# Patient Record
Sex: Female | Born: 1975 | Race: Black or African American | Hispanic: No | Marital: Single | Smoking: Former smoker
Health system: Southern US, Community
[De-identification: ages and names within clinical notes are randomized; demographics above are authoritative.]

## PROBLEM LIST (undated history)

## (undated) DIAGNOSIS — I10 Essential (primary) hypertension: Secondary | ICD-10-CM

## (undated) DIAGNOSIS — F419 Anxiety disorder, unspecified: Secondary | ICD-10-CM

## (undated) DIAGNOSIS — K219 Gastro-esophageal reflux disease without esophagitis: Secondary | ICD-10-CM

## (undated) DIAGNOSIS — M109 Gout, unspecified: Secondary | ICD-10-CM

## (undated) DIAGNOSIS — E669 Obesity, unspecified: Secondary | ICD-10-CM

## (undated) DIAGNOSIS — M542 Cervicalgia: Secondary | ICD-10-CM

## (undated) DIAGNOSIS — G8929 Other chronic pain: Secondary | ICD-10-CM

## (undated) HISTORY — PX: TUBAL LIGATION: SHX77

## (undated) HISTORY — PX: CHOLECYSTECTOMY: SHX55

---

## 1997-11-16 ENCOUNTER — Inpatient Hospital Stay: Admission: RE | Admit: 1997-11-16 | Discharge: 1997-11-16 | Payer: Self-pay | Admitting: *Deleted

## 1997-11-17 ENCOUNTER — Inpatient Hospital Stay (HOSPITAL_COMMUNITY): Admission: AD | Admit: 1997-11-17 | Discharge: 1997-11-20 | Payer: Self-pay | Admitting: *Deleted

## 1998-04-20 ENCOUNTER — Emergency Department (HOSPITAL_COMMUNITY): Admission: EM | Admit: 1998-04-20 | Discharge: 1998-04-20 | Payer: Self-pay | Admitting: Emergency Medicine

## 1998-06-27 ENCOUNTER — Emergency Department (HOSPITAL_COMMUNITY): Admission: EM | Admit: 1998-06-27 | Discharge: 1998-06-27 | Payer: Self-pay | Admitting: Internal Medicine

## 1998-08-10 ENCOUNTER — Emergency Department (HOSPITAL_COMMUNITY): Admission: EM | Admit: 1998-08-10 | Discharge: 1998-08-10 | Payer: Self-pay | Admitting: Emergency Medicine

## 1998-10-11 ENCOUNTER — Inpatient Hospital Stay (HOSPITAL_COMMUNITY): Admission: AD | Admit: 1998-10-11 | Discharge: 1998-10-13 | Payer: Self-pay | Admitting: *Deleted

## 1998-10-12 ENCOUNTER — Encounter: Payer: Self-pay | Admitting: *Deleted

## 1998-12-01 ENCOUNTER — Ambulatory Visit (HOSPITAL_COMMUNITY): Admission: RE | Admit: 1998-12-01 | Discharge: 1998-12-01 | Payer: Self-pay | Admitting: *Deleted

## 1998-12-26 ENCOUNTER — Inpatient Hospital Stay (HOSPITAL_COMMUNITY): Admission: AD | Admit: 1998-12-26 | Discharge: 1998-12-26 | Payer: Self-pay | Admitting: *Deleted

## 1999-02-20 ENCOUNTER — Inpatient Hospital Stay (HOSPITAL_COMMUNITY): Admission: AD | Admit: 1999-02-20 | Discharge: 1999-02-20 | Payer: Self-pay | Admitting: *Deleted

## 1999-03-24 ENCOUNTER — Inpatient Hospital Stay (HOSPITAL_COMMUNITY): Admission: AD | Admit: 1999-03-24 | Discharge: 1999-03-24 | Payer: Self-pay | Admitting: *Deleted

## 1999-03-25 ENCOUNTER — Inpatient Hospital Stay (HOSPITAL_COMMUNITY): Admission: AD | Admit: 1999-03-25 | Discharge: 1999-03-25 | Payer: Self-pay | Admitting: *Deleted

## 1999-03-29 ENCOUNTER — Inpatient Hospital Stay (HOSPITAL_COMMUNITY): Admission: AD | Admit: 1999-03-29 | Discharge: 1999-04-01 | Payer: Self-pay | Admitting: *Deleted

## 2000-02-27 ENCOUNTER — Emergency Department (HOSPITAL_COMMUNITY): Admission: EM | Admit: 2000-02-27 | Discharge: 2000-02-27 | Payer: Self-pay | Admitting: Emergency Medicine

## 2000-11-20 ENCOUNTER — Ambulatory Visit (HOSPITAL_COMMUNITY): Admission: RE | Admit: 2000-11-20 | Discharge: 2000-11-20 | Payer: Self-pay | Admitting: Family Medicine

## 2000-12-03 ENCOUNTER — Encounter: Payer: Self-pay | Admitting: *Deleted

## 2000-12-03 ENCOUNTER — Ambulatory Visit (HOSPITAL_COMMUNITY): Admission: RE | Admit: 2000-12-03 | Discharge: 2000-12-03 | Payer: Self-pay | Admitting: *Deleted

## 2001-02-04 ENCOUNTER — Other Ambulatory Visit: Admission: RE | Admit: 2001-02-04 | Discharge: 2001-02-04 | Payer: Self-pay | Admitting: Family Medicine

## 2001-09-05 ENCOUNTER — Encounter: Admission: RE | Admit: 2001-09-05 | Discharge: 2001-12-04 | Payer: Self-pay | Admitting: *Deleted

## 2002-09-07 ENCOUNTER — Other Ambulatory Visit: Admission: RE | Admit: 2002-09-07 | Discharge: 2002-09-07 | Payer: Self-pay | Admitting: Obstetrics and Gynecology

## 2003-10-04 ENCOUNTER — Other Ambulatory Visit: Admission: RE | Admit: 2003-10-04 | Discharge: 2003-10-04 | Payer: Self-pay | Admitting: Obstetrics and Gynecology

## 2003-10-05 ENCOUNTER — Encounter: Admission: RE | Admit: 2003-10-05 | Discharge: 2003-10-05 | Payer: Self-pay | Admitting: Obstetrics and Gynecology

## 2004-05-27 ENCOUNTER — Emergency Department (HOSPITAL_COMMUNITY): Admission: EM | Admit: 2004-05-27 | Discharge: 2004-05-27 | Payer: Self-pay

## 2004-08-02 ENCOUNTER — Ambulatory Visit (HOSPITAL_COMMUNITY): Admission: RE | Admit: 2004-08-02 | Discharge: 2004-08-02 | Payer: Self-pay | Admitting: Obstetrics and Gynecology

## 2005-01-01 ENCOUNTER — Inpatient Hospital Stay (HOSPITAL_COMMUNITY): Admission: AD | Admit: 2005-01-01 | Discharge: 2005-01-05 | Payer: Self-pay | Admitting: Obstetrics

## 2005-01-02 ENCOUNTER — Encounter (INDEPENDENT_AMBULATORY_CARE_PROVIDER_SITE_OTHER): Payer: Self-pay | Admitting: *Deleted

## 2005-10-22 ENCOUNTER — Encounter: Admission: RE | Admit: 2005-10-22 | Discharge: 2005-10-22 | Payer: Self-pay | Admitting: Family Medicine

## 2005-11-11 ENCOUNTER — Emergency Department (HOSPITAL_COMMUNITY): Admission: EM | Admit: 2005-11-11 | Discharge: 2005-11-11 | Payer: Self-pay | Admitting: Family Medicine

## 2006-05-14 ENCOUNTER — Emergency Department (HOSPITAL_COMMUNITY): Admission: EM | Admit: 2006-05-14 | Discharge: 2006-05-14 | Payer: Self-pay | Admitting: Emergency Medicine

## 2006-09-26 ENCOUNTER — Emergency Department (HOSPITAL_COMMUNITY): Admission: EM | Admit: 2006-09-26 | Discharge: 2006-09-26 | Payer: Self-pay | Admitting: Emergency Medicine

## 2007-06-09 ENCOUNTER — Ambulatory Visit (HOSPITAL_COMMUNITY): Admission: RE | Admit: 2007-06-09 | Discharge: 2007-06-09 | Payer: Self-pay | Admitting: Family Medicine

## 2007-09-15 ENCOUNTER — Encounter (INDEPENDENT_AMBULATORY_CARE_PROVIDER_SITE_OTHER): Payer: Self-pay | Admitting: Surgery

## 2007-09-16 ENCOUNTER — Inpatient Hospital Stay (HOSPITAL_COMMUNITY): Admission: RE | Admit: 2007-09-16 | Discharge: 2007-09-17 | Payer: Self-pay | Admitting: Surgery

## 2007-12-02 ENCOUNTER — Emergency Department (HOSPITAL_COMMUNITY): Admission: EM | Admit: 2007-12-02 | Discharge: 2007-12-02 | Payer: Self-pay | Admitting: Emergency Medicine

## 2008-05-06 ENCOUNTER — Emergency Department (HOSPITAL_COMMUNITY): Admission: EM | Admit: 2008-05-06 | Discharge: 2008-05-06 | Payer: Self-pay | Admitting: Family Medicine

## 2008-05-27 ENCOUNTER — Emergency Department (HOSPITAL_COMMUNITY): Admission: EM | Admit: 2008-05-27 | Discharge: 2008-05-27 | Payer: Self-pay | Admitting: Family Medicine

## 2008-05-29 ENCOUNTER — Emergency Department (HOSPITAL_COMMUNITY): Admission: EM | Admit: 2008-05-29 | Discharge: 2008-05-29 | Payer: Self-pay | Admitting: Emergency Medicine

## 2008-06-02 ENCOUNTER — Emergency Department (HOSPITAL_COMMUNITY): Admission: EM | Admit: 2008-06-02 | Discharge: 2008-06-02 | Payer: Self-pay | Admitting: Emergency Medicine

## 2008-08-27 ENCOUNTER — Emergency Department (HOSPITAL_COMMUNITY): Admission: EM | Admit: 2008-08-27 | Discharge: 2008-08-27 | Payer: Self-pay | Admitting: Emergency Medicine

## 2008-09-24 ENCOUNTER — Emergency Department (HOSPITAL_COMMUNITY): Admission: EM | Admit: 2008-09-24 | Discharge: 2008-09-24 | Payer: Self-pay | Admitting: Family Medicine

## 2009-03-01 ENCOUNTER — Emergency Department (HOSPITAL_COMMUNITY): Admission: EM | Admit: 2009-03-01 | Discharge: 2009-03-01 | Payer: Self-pay | Admitting: Emergency Medicine

## 2009-05-04 ENCOUNTER — Encounter: Admission: RE | Admit: 2009-05-04 | Discharge: 2009-05-04 | Payer: Self-pay | Admitting: Nurse Practitioner

## 2010-06-21 DIAGNOSIS — D649 Anemia, unspecified: Secondary | ICD-10-CM | POA: Insufficient documentation

## 2010-07-21 DIAGNOSIS — K2100 Gastro-esophageal reflux disease with esophagitis, without bleeding: Secondary | ICD-10-CM | POA: Insufficient documentation

## 2010-09-20 DIAGNOSIS — D508 Other iron deficiency anemias: Secondary | ICD-10-CM | POA: Insufficient documentation

## 2010-09-20 DIAGNOSIS — G4700 Insomnia, unspecified: Secondary | ICD-10-CM | POA: Insufficient documentation

## 2010-11-05 ENCOUNTER — Encounter: Payer: Self-pay | Admitting: Family Medicine

## 2010-11-14 ENCOUNTER — Emergency Department (HOSPITAL_COMMUNITY)
Admission: EM | Admit: 2010-11-14 | Discharge: 2010-11-14 | Payer: Self-pay | Source: Home / Self Care | Admitting: Family Medicine

## 2010-12-01 DIAGNOSIS — R5383 Other fatigue: Secondary | ICD-10-CM | POA: Insufficient documentation

## 2010-12-01 DIAGNOSIS — I10 Essential (primary) hypertension: Secondary | ICD-10-CM | POA: Insufficient documentation

## 2010-12-01 DIAGNOSIS — R5381 Other malaise: Secondary | ICD-10-CM | POA: Insufficient documentation

## 2010-12-01 DIAGNOSIS — M545 Low back pain, unspecified: Secondary | ICD-10-CM | POA: Insufficient documentation

## 2010-12-01 DIAGNOSIS — E668 Other obesity: Secondary | ICD-10-CM | POA: Insufficient documentation

## 2011-01-23 LAB — DIFFERENTIAL
Basophils Relative: 0 % (ref 0–1)
Eosinophils Relative: 1 % (ref 0–5)
Lymphocytes Relative: 31 % (ref 12–46)
Monocytes Relative: 6 % (ref 3–12)
Neutro Abs: 3.7 10*3/uL (ref 1.7–7.7)
Neutrophils Relative %: 62 % (ref 43–77)

## 2011-01-23 LAB — POCT CARDIAC MARKERS: Myoglobin, poc: 96.8 ng/mL (ref 12–200)

## 2011-01-23 LAB — COMPREHENSIVE METABOLIC PANEL
Albumin: 3.3 g/dL — ABNORMAL LOW (ref 3.5–5.2)
Alkaline Phosphatase: 55 U/L (ref 39–117)
CO2: 31 mEq/L (ref 19–32)
Calcium: 9.4 mg/dL (ref 8.4–10.5)
Chloride: 102 mEq/L (ref 96–112)
GFR calc Af Amer: >60 mL/min (ref >60)
Glucose, Bld: 105 mg/dL — ABNORMAL HIGH (ref 70–99)
Sodium: 140 mEq/L (ref 135–145)
Total Protein: 7.3 g/dL (ref 6.0–8.3)

## 2011-01-23 LAB — CBC
Hemoglobin: 10 g/dL — ABNORMAL LOW (ref 12.0–15.0)
MCV: 64 fL — ABNORMAL LOW (ref 78.0–100.0)
RBC: 4.95 MIL/uL (ref 3.87–5.11)
WBC: 6.1 10*3/uL (ref 4.0–10.5)

## 2011-01-23 LAB — D-DIMER, QUANTITATIVE: D-Dimer, Quant: 0.39 ug/mL-FEU (ref 0.00–0.48)

## 2011-02-27 NOTE — Op Note (Signed)
NAME:  Lydia Gross, Lydia Gross            ACCOUNT NO.:  [id number]   MEDICAL RECORD NO.:  [id number]          PATIENT TYPE:  AMB   LOCATION:  DAY                          FACILITY:  [facility]   PHYSICIAN:  Wilmon Arms. Corliss Skains, M.D. DATE OF BIRTH:  11/28/1975   DATE OF PROCEDURE:  09/15/2007  DATE OF DISCHARGE:                               OPERATIVE REPORT   PREOPERATIVE DIAGNOSIS:  Chronic calculus cholecystitis.   POSTOPERATIVE DIAGNOSIS:  Acute cholecystitis.   PROCEDURE:  Laparoscopic cholecystectomy with intraoperative  cholangiogram.   SURGEON:  Wilmon Arms. Corliss Skains, M.D.   ASSISTANT:  Gabrielle Dare. Janee Morn, M.D.   ANESTHESIA:  General endotracheal.   INDICATIONS:  The patient is a morbidly obese 35 year old female who  presents with a 1-year history of intermittent right upper quadrant  abdominal pain.  Recently this has become more frequent and more severe.  Ultrasound showed cholelithiasis with a mildly thickened gallbladder  wall.  There is no sign of pericholecystic fluid.  Her preoperative  liver function tests were normal.  She presents today for laparoscopic  cholecystectomy.   DESCRIPTION OF PROCEDURE:  The patient brought to the operating room and  placed in supine position on the operating table.  After adequate level  of general anesthesia was obtained, the patient's abdomen was prepped  with Betadine and draped in sterile fashion.  A time-out was taken  assure the proper patient and proper procedure.  A transverse incision  was made above her umbilicus.  Dissection was carried down through a  considerable amount of adipose tissue to the fascia.  The fascia was  grasped with Kocher clamps and elevated.  We incised the fascia with  scalpel.  Blunt dissection was used to dissect into the peritoneal  cavity.  A stay suture of 0 Vicryl was placed around the fascial  opening.  The Hasson cannula was inserted and secured with the stay  sutures.  Pneumoperitoneum is obtained by  insufflating CO2 maintaining  maximal pressure of 15 mmHg.  The patient was positioned in reverse  Trendelenburg and tilted to her left.  The laparoscope was inserted.  The omentum was densely adherent to the gallbladder.  The liver appeared  normal.  An 11 mm port was placed in subxiphoid position.  Two 5 mL  ports placed in the right upper quadrant.  We tried to peel the omentum  off of the gallbladder.  The gallbladder was very thickened and  inflamed.  It appeared to be acutely infected.  We used the suction  aspirator to decompress the gallbladder.  This allowed [place] to be able to  grasp the fundus of the gallbladder and elevated it up.  We bluntly  dissected around the hilum of the gallbladder.  There were several small  vessels which were divided between clips.  We bluntly dissected around  the duct.  It appeared the patient had a very short cystic duct and the  common bile duct seemed to be tented up.  We were able to place a clip  distally on the short cystic duct.  A small opening was created on the  cystic duct.  A cholangiogram catheter was inserted through a stab  incision and threaded into the cystic duct with considerable difficulty.  A cholangiogram was obtained.  This confirmed our findings that there  was a very short cystic duct and contrast flowed easily throughout the  common bile duct.  No sign of obstruction was noted.  The catheter was  then removed.  We carefully placed two clips on the short cystic duct  and divided.  The cystic artery was also ligated with clips and divided.  Cautery was then used to remove the gallbladder from the liver bed.  This was done with considerable difficulty due to the degree of  inflammation.  The gallbladder was placed in EndoCatch sac.  We then  thoroughly irrigated the gallbladder fossa.  Large amounts of cautery  used to dry up the gallbladder fossa.  We placed two pieces of Surgicel  in the gallbladder fossa.  Another piece was  placed on the dome of the  liver where a small inadvertent puncture site had been placed.  Hemostasis was felt to be adequate.  We then irrigated the right upper  quadrant thoroughly and suctioned out as much irrigation as possible.  We then grasped the EndoCatch sac and the gallbladder and pulled it up  to the umbilical port site.  We had the larger fascial opening to allow  removal of the entire gallbladder.  The stay sutures were used to close  umbilical fascia.  We had to use an additional 0 Vicryl with an  Endoclose device to finish closing the umbilical fascia.  Pneumoperitoneum is then released as the ports were removed.  Deep  layers of 4-0 Monocryl placed in the two midline incisions.  Dermabond  was used to close the skin in all four ports.  The patient was then  extubated and brought to recovery in stable condition.  All sponge,  instrument, and needle counts correct.      Wilmon Arms. Tsuei, M.D.  Electronically Signed     MKT/MEDQ  D:  09/15/2007  T:  09/15/2007  Job:  [id number]

## 2011-03-02 NOTE — H&P (Signed)
NAME:  Lydia Gross, CONSTABLE NO.:  [id number]   MEDICAL RECORD NO.:  [id number]          PATIENT TYPE:  INP   LOCATION:  9169                          FACILITY:  [facility]   PHYSICIAN:  Kathreen Cosier, M.D.DATE OF BIRTH:  05-Jan-1976   DATE OF ADMISSION:  01/01/2005  DATE OF DISCHARGE:                                HISTORY & PHYSICAL   The patient is a 35 year old gravida 4, para 2-0-1-2, [facility] December 29, 2004 who  was admitted for induction. She was positive GBS, treated with penicillin.  Cervix 1 cm, 50%, vertex, -3. Membranes were erupted artificially and the  fluid clear. IUPC was inserted. The patient also desired sterilization. The  patient was on Pitocin stimulation throughout the day of March 20 until the  day of March 21, and she would contract every two to three minutes, her  Montevideos were inadequate and could never be brought up to adequacy, and  she progressed to 3 cm, 70%, vertex, -3 station, and it was decided after  being labor from the a.m. of March 20 that she deliver by Cesarean section  for failure to progress.   PHYSICAL EXAMINATION:  GENERAL:  Reveals obesity 350 pounds in no distress.  HEENT:  Negative.  LUNGS:  Clear.  HEART:  Regular rhythm. No murmurs or gallops.  ABDOMEN:  Pendulous, term.  PELVIC:  As described above.  EXTREMITIES:  Negative.      BAM/MEDQ  D:  01/02/2005  T:  01/02/2005  Job:  [id number]

## 2011-03-02 NOTE — Op Note (Signed)
NAME:  Lydia Gross, Lydia Gross NO.:  [id number]   MEDICAL RECORD NO.:  [id number]          PATIENT TYPE:  INP   LOCATION:  9169                          FACILITY:  [facility]   PHYSICIAN:  Kathreen Cosier, M.D.DATE OF BIRTH:  1976/08/05   DATE OF PROCEDURE:  01/02/2005  DATE OF DISCHARGE:                                 OPERATIVE REPORT   PREOPERATIVE DIAGNOSIS:  Failure to progress in labor, failed induction.   POSTOPERATIVE DIAGNOSIS:  Failure to progress in labor, failed induction.   OPERATION PERFORMED:   SURGEON:  Kathreen Cosier, M.D.   ASSISTANT:  Bing Neighbors. Clearance Coots, M.D.   ANESTHESIA:  Epidural.   DESCRIPTION OF PROCEDURE:  Patient placed on the operating table in the  supine position, abdomen prepped and draped.  Bladder emptied with a Foley  catheter.  Transverse suprapubic incision made and carried down to the  rectus fascia.  The fascia cleaned and incised the length of the incision.  Rectus muscles were retracted laterally.  Peritoneum retracted  longitudinally.  Transverse incision made in the visceral peritoneum above  the bladder and the bladder mobilized superiorly.  Transverse lower uterine  incision was made and the baby was in a transverse lie and was delivered as  a breech female.  Apgar 2 and 9 with a tight nuchal cord.  Cord pH was 7.32.  Baby weighed 6 pounds 14 ounces.  Team was in attendance.  Fluid was clear.  Placenta was anterior, removed manually.  Uterine cavity cleaned with dry  laps.  Uterine incision closed in one layer with a continuous suture of  #1  chromic.  Hemostasis was satisfactory.  Bladder flap reattached with 2-0  chromic.  Uterus was contracted.  Right tube grasped in the midportion with  Babcock clamp, 0 plain suture placed in the mesosalpinx below the portion of  the tube within the clamp.  This was then tied and approximately one inch of  tube transected.  Procedure done in similar fashion the other side.  Lap and  sponge counts correct.  Abdomen closed in layers.  Peritoneum continuous  with 2-0 chromic.  Fascia with continuous suture of 0 Dexon and the skin  closed with subcuticular stitch of 4-0 Monocryl.  Blood loss 700 cc.      BAM/MEDQ  D:  01/02/2005  T:  01/02/2005  Job:  [id number]

## 2011-03-02 NOTE — Discharge Summary (Signed)
NAME:  Lydia Gross, Lydia Gross NO.:  [id number]   MEDICAL RECORD NO.:  [id number]          PATIENT TYPE:  INP   LOCATION:  9146                          FACILITY:  [facility]   PHYSICIAN:  Kathreen Cosier, M.D.DATE OF BIRTH:  10-04-1976   DATE OF ADMISSION:  01/01/2005  DATE OF DISCHARGE:  01/05/2005                                 DISCHARGE SUMMARY   The patient is a 35 year old, gravida 4, para 2-0-1-2, [facility] December 29, 2004.  She presented for induction of labor, she had a positive GBS, cervix was 1  cm, 50% vertex, -3.  The patient underwent a low transverse cesarean section  because of a transverse lying cord. The pH was 7.32, baby weighed 6 pounds  14 ounces. The patient desired a tubal ligation which was also performed. On  admission, her hemoglobin was 12.1, postop 8.7, platelets 235 __________,  urine negative, RPR negative. Postoperatively, the patient did well and was  discharged home on the third postoperative day ambulatory on a regular diet  on Tylenol #3 for pain to see me in six weeks.   DISCHARGE DIAGNOSES:  Status post primary low transverse cesarean section at  term and tubal ligation.      BAM/MEDQ  D:  02/07/2005  T:  02/07/2005  Job:  [id number]

## 2011-03-02 NOTE — Discharge Summary (Signed)
NAME:  Lydia Gross, CORMIER NO.:  [id number]   MEDICAL RECORD NO.:  [id number]          PATIENT TYPE:  INP   LOCATION:  1538                         FACILITY:  [facility]   PHYSICIAN:  Wilmon Arms. Corliss Skains, M.D. DATE OF BIRTH:  May 25, 1976   DATE OF ADMISSION:  09/15/2007  DATE OF DISCHARGE:  09/17/2007                               DISCHARGE SUMMARY   PREOPERATIVE DIAGNOSIS:  Chronic calculous cholecystitis.   POSTOPERATIVE DIAGNOSIS:  Acute cholecystitis.   PROCEDURE:  Laparoscopic cholecystectomy.   DISCHARGE DIAGNOSIS:  Acute cholecystitis.   BRIEF HISTORY:  The patient is a morbidly obese 35 year old female who  presents with a 1-year history of intermittent right upper quadrant  pain.  Recently this has become more frequent and more severe.  Over the  last few days the patient had severe pain.  However, she did not seek  any medical attention since she was scheduled for surgery.   HOSPITAL COURSE:  The patient was brought to the operating room on  September 15, 2007.  She underwent a laparoscopic cholecystectomy.  She  was noted to have an acutely inflamed gallbladder at the time of  surgery.  Due to her acute cholecystitis, the decision was made to keep  her in the hospital rather than sending her home.  She progressed over  the next couple of days.  She tolerated a diet.  She was transitioned to  p.o. pain medication.  Her blood work looked normal as far as liver  function tests.  She has been discharged home on postop day #2.   DISCHARGE INSTRUCTIONS:  1. Percocet p.r.n. for pain.  2. A low-fat diet.  3. No heavy lifting.  4. The patient may shower.  5. Follow up in 1-2 weeks with Dr. Corliss Skains.      Wilmon Arms. Tsuei, M.D.  Electronically Signed     MKT/MEDQ  D:  09/30/2007  T:  09/30/2007  Job:  [id number]

## 2011-05-01 DIAGNOSIS — L659 Nonscarring hair loss, unspecified: Secondary | ICD-10-CM | POA: Insufficient documentation

## 2011-05-02 DIAGNOSIS — E559 Vitamin D deficiency, unspecified: Secondary | ICD-10-CM | POA: Insufficient documentation

## 2011-05-12 ENCOUNTER — Inpatient Hospital Stay (INDEPENDENT_AMBULATORY_CARE_PROVIDER_SITE_OTHER)
Admission: RE | Admit: 2011-05-12 | Discharge: 2011-05-12 | Disposition: A | Discharge status: Home / Self Care | Payer: Medicaid Other | Source: Ambulatory Visit | Attending: Family Medicine | Admitting: Family Medicine

## 2011-05-12 DIAGNOSIS — I10 Essential (primary) hypertension: Secondary | ICD-10-CM

## 2011-05-12 DIAGNOSIS — T50995A Adverse effect of other drugs, medicaments and biological substances, initial encounter: Secondary | ICD-10-CM

## 2011-05-15 DIAGNOSIS — R600 Localized edema: Secondary | ICD-10-CM | POA: Insufficient documentation

## 2011-07-23 LAB — PREGNANCY, URINE: Preg Test, Ur: NEGATIVE

## 2011-07-23 LAB — COMPREHENSIVE METABOLIC PANEL
ALT: 19
ALT: 31
AST: 38 — ABNORMAL HIGH
Albumin: 2.9 — ABNORMAL LOW
Alkaline Phosphatase: 65
BUN: 8
Calcium: 9.1
Chloride: 98
Creatinine, Ser: 0.9
GFR calc Af Amer: >60
Glucose, Bld: 106 — ABNORMAL HIGH
Potassium: 3.6
Sodium: 132 — ABNORMAL LOW
Sodium: 135
Total Bilirubin: 1.1
Total Protein: 7.5
Total Protein: 7.6

## 2011-07-23 LAB — CBC
HCT: 29.8 — ABNORMAL LOW
Hemoglobin: 9.7 — ABNORMAL LOW
MCHC: 32.7
MCV: 64.4 — ABNORMAL LOW
Platelets: 449 — ABNORMAL HIGH
RBC: 4.49
RBC: 4.67
WBC: 10.6 — ABNORMAL HIGH
WBC: 15.3 — ABNORMAL HIGH

## 2011-07-23 LAB — DIFFERENTIAL
Basophils Absolute: 0.1
Basophils Relative: 1
Eosinophils Absolute: 0.1 — ABNORMAL LOW
Lymphocytes Relative: 20
Monocytes Absolute: 0.5
Neutro Abs: 7.8 — ABNORMAL HIGH
Neutrophils Relative %: 73

## 2012-04-10 ENCOUNTER — Ambulatory Visit: Payer: Medicaid Other | Admitting: Dietician

## 2012-04-30 ENCOUNTER — Ambulatory Visit: Payer: Medicaid Other | Admitting: Dietician

## 2012-05-12 ENCOUNTER — Emergency Department (HOSPITAL_COMMUNITY): Payer: Medicaid Other

## 2012-05-12 ENCOUNTER — Encounter (HOSPITAL_COMMUNITY): Payer: Self-pay | Admitting: *Deleted

## 2012-05-12 ENCOUNTER — Emergency Department (HOSPITAL_COMMUNITY)
Admission: EM | Admit: 2012-05-12 | Discharge: 2012-05-12 | Disposition: A | Discharge status: Home / Self Care | Payer: Medicaid Other | Attending: Emergency Medicine | Admitting: Emergency Medicine

## 2012-05-12 DIAGNOSIS — K219 Gastro-esophageal reflux disease without esophagitis: Secondary | ICD-10-CM | POA: Insufficient documentation

## 2012-05-12 DIAGNOSIS — M542 Cervicalgia: Secondary | ICD-10-CM

## 2012-05-12 DIAGNOSIS — I10 Essential (primary) hypertension: Secondary | ICD-10-CM | POA: Insufficient documentation

## 2012-05-12 DIAGNOSIS — M549 Dorsalgia, unspecified: Secondary | ICD-10-CM | POA: Insufficient documentation

## 2012-05-12 DIAGNOSIS — Z9089 Acquired absence of other organs: Secondary | ICD-10-CM | POA: Insufficient documentation

## 2012-05-12 HISTORY — DX: Essential (primary) hypertension: I10

## 2012-05-12 HISTORY — DX: Obesity, unspecified: E66.9

## 2012-05-12 HISTORY — DX: Gastro-esophageal reflux disease without esophagitis: K21.9

## 2012-05-12 MED ORDER — ACETAMINOPHEN 325 MG PO TABS
975.0000 mg | ORAL_TABLET | Freq: Once | ORAL | Status: AC
  Administered 2012-05-12: 975 mg via ORAL
  Filled 2012-05-12: qty 3

## 2012-05-12 MED ORDER — PREDNISONE 20 MG PO TABS: 40.0000 mg | ORAL_TABLET | Freq: Every day | ORAL | Status: AC

## 2012-05-12 MED ORDER — KETOROLAC TROMETHAMINE 60 MG/2ML IM SOLN
60.0000 mg | Freq: Once | INTRAMUSCULAR | Status: AC
  Administered 2012-05-12: 60 mg via INTRAMUSCULAR
  Filled 2012-05-12: qty 2

## 2012-05-12 MED ORDER — OXYCODONE-ACETAMINOPHEN 5-325 MG PO TABS: 1.0000 | ORAL_TABLET | ORAL | Status: AC | PRN

## 2012-05-12 MED ORDER — ACETAMINOPHEN 500 MG PO TABS: 1000.0000 mg | ORAL_TABLET | Freq: Once | ORAL | Status: DC

## 2012-05-12 NOTE — ED Notes (Signed)
C/o L upper back pain, near posterior shoulder and L lateral neck, comes and goes, onset 2d ago. Radiates down L arm. No relief with naproxen. (denies: nvd fever, sob, CP, dizziness, cough, congestion, cold sx or other sx), grip strength and radial pulses equal and strong. Pain worse with lifting arms up and lying down.

## 2012-05-12 NOTE — ED Notes (Signed)
Patient transported to CT 

## 2012-05-12 NOTE — ED Notes (Signed)
Patient is resting comfortably. 

## 2012-05-12 NOTE — ED Notes (Signed)
Pt sts that tylenol will not work and needs something stronger, md made aware

## 2012-05-12 NOTE — ED Provider Notes (Signed)
Medical screening examination/treatment/procedure(s) were performed by non-physician practitioner and as supervising physician I was immediately available for consultation/collaboration.   Geral Tuch, MD 05/12/12 1640 

## 2012-05-12 NOTE — ED Provider Notes (Signed)
History     CSN: [id number]  Arrival date & time 05/12/12  1478   None     Chief Complaint  Patient presents with  . Back Pain  . Neck Pain    (Consider location/radiation/quality/duration/timing/severity/associated sxs/prior treatment) HPI  The history is provided by the patient.    Chief Complaint Patient presents with:   Back Pain   Neck Pain    Onset x1 month Duration x2days Exacerbating Factors: movemnet Associated Symptoms: muscle spasm  36 year old morbidly obese female complaining of left neck pain intermittently for one month. Denies trauma, weakness or paresthesia. Patient states there is a lump on the left side of her neck and when that pops up when pain is exacerbated. Ppain is located on the left neck radiates to left shoulder and to left elbow. Patient has tried naproxen and Flexeril with no relief. Pain is positional wakes her from sleep at night 8/10 at worst 4/10 currently.    Past Medical History  Diagnosis Date  . Hypertension   . Acid reflux     Past Surgical History  Procedure Date  . Cholecystectomy   . Cesarean section     No family history on file.  History  Substance Use Topics  . Smoking status: Never Smoker   . Smokeless tobacco: Not on file  . Alcohol Use: Yes     little    OB History    Grav Para Term Preterm Abortions TAB SAB Ect Mult Living                  Review of Systems  Constitutional: Negative.  Negative for fever.  HENT: Positive for neck pain. Negative for neck stiffness.   Neurological: Negative for weakness and numbness.  All other systems reviewed and are negative.    Allergies  Review of patient's allergies indicates no known allergies.  Home Medications   Current Outpatient Rx  Name Route Sig Dispense Refill  . CYCLOBENZAPRINE HCL 10 MG PO TABS Oral Take 10 mg by mouth 3 (three) times daily as needed. For pain    . LISINOPRIL 40 MG PO TABS Oral Take 40 mg by mouth daily.    Marland Kitchen NAPROXEN 250 MG PO  TABS Oral Take 250 mg by mouth 2 (two) times daily with a meal. For pain    . RANITIDINE HCL 150 MG PO TABS Oral Take 150 mg by mouth daily.      BP 136/81  Temp 98.7 F (37.1 C) (Oral)  Resp 18  SpO2 100%  LMP 05/04/2012  Physical Exam  Nursing note and vitals reviewed. Constitutional: She is oriented to person, place, and time. She appears well-developed and well-nourished. No distress.  HENT:  Head: Normocephalic and atraumatic.  Eyes: Conjunctivae and EOM are normal. Pupils are equal, round, and reactive to light.  Neck: Normal range of motion. Neck supple. No tracheal deviation present. No thyromegaly present.       Full range of motion, no nuchal rigidity. No tenderness to palpation. No midline step-offs. acanthosis nigricans  Cardiovascular: Normal rate and regular rhythm.   Pulmonary/Chest: Effort normal and breath sounds normal. No stridor.  Abdominal: Soft.  Musculoskeletal: Normal range of motion. She exhibits no edema and no tenderness.       Full range of motion to left shoulder no tenderness to palpation. Radial pulses 2+ bilaterally.sensation to light touch and pinprick intact. Strength 5/5 x4  Lymphadenopathy:    She has no cervical adenopathy.  Neurological: She is alert  and oriented to person, place, and time.  Skin: Skin is warm and dry.  Psychiatric: She has a normal mood and affect.    ED Course  Procedures (including critical care time)  Labs Reviewed - No data to display Dg Cervical Spine Complete  05/12/2012  *RADIOLOGY REPORT*  Clinical Data: Neck pain, no injury  CERVICAL SPINE - COMPLETE 4+ VIEW  Comparison: None.  Findings: Cervical spine is visualized to C6-7 on the lateral view and the disease 71 on the lateral swimmer's view.  Normal cervical lordosis.  Irregularity/discontinuity involving the posterior arch of C1.  Otherwise, no evidence of fracture or dislocation.  The vertebral body heights and intervertebral disc spaces are maintained.  The dens  appears intact.  The lateral masses of C1 are symmetric.  No prevertebral soft tissue swelling.  The bilateral neural foramina are within normal limits.  The visualized lung apices are clear.  IMPRESSION: Irregularity/discontinuity involving the posterior arch of C1.  In the absence of traumatic history, this may reflect a congenital abnormality.  Consider CT for further evaluation as clinically warranted.  Otherwise, no evidence of fracture or dislocation.  Original Report Authenticated By: Charline Bills, M.D.   Ct Cervical Spine Wo Contrast  05/12/2012  *RADIOLOGY REPORT*  Clinical Data: Left posterior neck pain  CT CERVICAL SPINE WITHOUT CONTRAST  Technique:  Multidetector CT imaging of the cervical spine was performed. Multiplanar CT image reconstructions were also generated.  Comparison: Cervical spine radiographs dated 05/12/2012  Findings: Mild straightening of the cervical spine.  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  No evidence of fracture or dislocation. The vertebral body heights and intervertebral disc spaces are maintained.  The dens appears intact.  No prevertebral soft tissue swelling.  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  No evidence of traumatic injury to the cervical spine.  Original Report Authenticated By: Charline Bills, M.D.     1. Cervicalgia       MDM  36 y/o female with left neck pain radiating down left arm, no h/o trauma. Physical wnl. Will obtain c-spine XR to r/o joint space narrowing pinching nerve. Pain control options limited by the fact the patient is driving. C-spine x-ray shows irregularity on the posterior arch of C1 he recommends CT to further evaluate. C-spine CT shows congenital non-fusion of C1 Not likely the cause of patient's acute problem. I suspects suspect muscle spasm and pinched nerve.  I will DC patient with pain control, 5 day prednisone taper, and primary care or orthopedic followup.  Pt  verbalized understanding and agrees with care plan. Outpatient follow-up and return precautions given.          Wynetta Emery, PA-C 05/12/12 [phone]

## 2012-05-21 ENCOUNTER — Encounter: Payer: Self-pay | Admitting: Dietician

## 2012-05-21 ENCOUNTER — Encounter: Payer: Medicaid Other | Attending: Internal Medicine | Admitting: Dietician

## 2012-05-21 DIAGNOSIS — K219 Gastro-esophageal reflux disease without esophagitis: Secondary | ICD-10-CM | POA: Insufficient documentation

## 2012-05-21 DIAGNOSIS — Z713 Dietary counseling and surveillance: Secondary | ICD-10-CM | POA: Insufficient documentation

## 2012-05-21 NOTE — Patient Instructions (Addendum)
   Aim to drink less sugar.  Use water as often as possible.  Use the diet soda or the diet Crystal Light or the diet Kool-Aid  Try to have a protein serving at every meal and snack.  It tends to stick to your ribs and you feel fuller and fed with protein foods.  Use the list on the yellow card for proteins.  Make the proteins lean or low fat as (2% cheese, remove the skin from the chicken before eating, trim the fat from the meat).  Try to bake, broil, grill, roast, steam when cooking.  Try to avoid frying.  Aim for 64 oz of fluid per day.  Try to do chicken, [place] of fish more often than the red meats.  Keep up the regular meals and snack in there afternoon.  If having the gummy bears, eat only the serving size that is on the food label.  Separate the gummy bears into individual servings.  At every meal try to have a non-starchy vegetable (cabbage, greens, lettuce, onion, cucumbers, tomatoes)  Complete the [employer] scholarship.  Aim for walking in the water or taking a water exercise class.  Water will put less stress on the knees.  Aim for a goal of 150 minutes of exercise per week when you have built up your tolerance.  Try using the 1400 calorie diet menu for suggestions as to meals and portion sizes.

## 2012-05-21 NOTE — Progress Notes (Signed)
  Medical Nutrition Therapy:  Appt start time: 1530 end time:  1630.   Assessment:  Primary concerns today: Ask for appointment.  Having issues with stress, having problems with meal preparation, and not likiing many vegetables.. Goal of adjusting to more healthy eating habits.  Would like to lose some weight.  MEDICATIONS: Med review completed.   DIETARY INTAKE:  Usual eating pattern includes 3 meals and 1 snacks per day.  Everyday foods include Candy, especially gummy bears.  Avoided foods include green beans and many of the other non-starchy vegetables..    24-hr recall:  B ( AM): 5;00-8/9:00  Grab some leftovers from fridge, slice of left over pizza. Snack on the run.  Pack of peanut butter crackers, drank water  Snk ( AM): sometimes but not really.  L ( PM): 2 pieces of fried chicken, pineapples slices, 2 slices.Drinking a lot of water.   Snk ( PM): sometimes,. Gummy bears, chips, cheeseits, popcorn.  Sip on juice  D ( PM): 6:00-6:30 PM fried chicken, wing and thigh, with side salad, eggs,  Croutons, cheese, cukes, tomatoes. Red wine vinaigrette, citrus punch juice.12 oz. Snk ( PM): No snacks after supper.   Beverages: Soda, regular, regular Kool-Aid., will drink the diet soda, water.  Usual physical activity: No exercise.  (housework, laundry, no yard work.  Having issues with her knee giving out. Has considered Zomba, but does not have the money to purchase the tapes.  Estimated energy needs:Ht: 69.5 in  WT: 383.9  Lb  BMI:56.7 kg/m2  Adj WT: 223 lb (101 kg) 1400-1500 calories 160-165 g carbohydrates 105-110 g protein 38-40 g fat  Progress Towards Goal(s):  In progress.   Nutritional Diagnosis:  [place]-3.3 Overweight/obesity As related to increased intake of concentrated sweets as candy, sweets, regular soda and nor regular exercise/activity.  As evidenced by Weight of 383 lbs, BMI of 56.7 kg/m2..    Intervention:  Nutrition Since MD appointment has lost 4 lb. Has increase in  frying and fatty foods along with continuing to stress eating and use of gummy bears and regular soda.   Goal:Weight loss to help with decreasing the pain and discomfort of the GERD. Recommended less frying and fried foods.  Use of the diet soda, aim for 32 oz of water daily in addition to the diet drinks. Look to complete the scholarship the the [facility] for walking in the water.  Goal of 1 lb /week.  Handouts given during visit include:  Food label with the recommendations for fat at 5 gm/serving and sugar at (0-9) gm per serving.  Nutritional Strategies for Weight loss  Menu for 1400 calories  Snack list   List of non-starchy vegetables.    Monitoring/Evaluation:  Dietary intake, exercise, and body weight in 8 weeks, to call for an appointment. Marland Kitchen

## 2013-01-22 ENCOUNTER — Encounter (HOSPITAL_COMMUNITY): Payer: Self-pay | Admitting: Emergency Medicine

## 2013-01-22 ENCOUNTER — Emergency Department (HOSPITAL_COMMUNITY)
Admission: EM | Admit: 2013-01-22 | Discharge: 2013-01-22 | Disposition: A | Discharge status: Home / Self Care | Payer: Medicaid Other | Attending: Emergency Medicine | Admitting: Emergency Medicine

## 2013-01-22 DIAGNOSIS — M25519 Pain in unspecified shoulder: Secondary | ICD-10-CM | POA: Insufficient documentation

## 2013-01-22 DIAGNOSIS — K219 Gastro-esophageal reflux disease without esophagitis: Secondary | ICD-10-CM | POA: Insufficient documentation

## 2013-01-22 DIAGNOSIS — E669 Obesity, unspecified: Secondary | ICD-10-CM | POA: Insufficient documentation

## 2013-01-22 DIAGNOSIS — I10 Essential (primary) hypertension: Secondary | ICD-10-CM | POA: Insufficient documentation

## 2013-01-22 DIAGNOSIS — M549 Dorsalgia, unspecified: Secondary | ICD-10-CM | POA: Insufficient documentation

## 2013-01-22 DIAGNOSIS — Z79899 Other long term (current) drug therapy: Secondary | ICD-10-CM | POA: Insufficient documentation

## 2013-01-22 DIAGNOSIS — M542 Cervicalgia: Secondary | ICD-10-CM | POA: Insufficient documentation

## 2013-01-22 MED ORDER — METHYLPREDNISOLONE 4 MG PO KIT: PACK | ORAL | Status: DC

## 2013-01-22 MED ORDER — KETOROLAC TROMETHAMINE 60 MG/2ML IM SOLN
30.0000 mg | Freq: Once | INTRAMUSCULAR | Status: AC
  Administered 2013-01-22: 30 mg via INTRAMUSCULAR
  Filled 2013-01-22: qty 2

## 2013-01-22 MED ORDER — OXYCODONE-ACETAMINOPHEN 5-325 MG PO TABS: ORAL_TABLET | ORAL | Status: DC

## 2013-01-22 NOTE — ED Notes (Signed)
Pt requesting discharge paperwork bc she has to pick someone up

## 2013-01-22 NOTE — ED Notes (Addendum)
Pt c/o neck and shoulder pain onset yesterday. Pt has history of same and relates it to stress. Pt recently had some problems with her child and was involved in a tussle with him. Pt tried muscle relaxer and naproxen yesterday without relief.
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
## 2013-01-22 NOTE — ED Notes (Signed)
Pt co neck and rt shoulder pain 5/10 that began yesterday.  Pt has hx of same and states it is normally related to stress.  Pt states she took a flexoril and aleve yesterday but has not taken anything today.  Pt alert oriented X4.

## 2013-01-24 NOTE — ED Provider Notes (Signed)
Medical screening examination/treatment/procedure(s) were performed by non-physician practitioner and as supervising physician I was immediately available for consultation/collaboration.   Laray Anger, DO 01/24/13 1152

## 2013-05-11 ENCOUNTER — Emergency Department (INDEPENDENT_AMBULATORY_CARE_PROVIDER_SITE_OTHER)
Admission: EM | Admit: 2013-05-11 | Discharge: 2013-05-11 | Disposition: A | Discharge status: Home / Self Care | Payer: Medicaid Other | Source: Home / Self Care | Attending: Family Medicine | Admitting: Family Medicine

## 2013-05-11 ENCOUNTER — Encounter (HOSPITAL_COMMUNITY): Payer: Self-pay | Admitting: Emergency Medicine

## 2013-05-11 DIAGNOSIS — M5412 Radiculopathy, cervical region: Secondary | ICD-10-CM

## 2013-05-11 DIAGNOSIS — M62838 Other muscle spasm: Secondary | ICD-10-CM

## 2013-05-11 MED ORDER — METHYLPREDNISOLONE ACETATE 40 MG/ML IJ SUSP
80.0000 mg | Freq: Once | INTRAMUSCULAR | Status: AC
  Administered 2013-05-11: 80 mg via INTRAMUSCULAR

## 2013-05-11 MED ORDER — KETOROLAC TROMETHAMINE 60 MG/2ML IM SOLN
60.0000 mg | Freq: Once | INTRAMUSCULAR | Status: AC
  Administered 2013-05-11: 60 mg via INTRAMUSCULAR

## 2013-05-11 MED ORDER — KETOROLAC TROMETHAMINE 60 MG/2ML IM SOLN
INTRAMUSCULAR | Status: AC
  Filled 2013-05-11: qty 2

## 2013-05-11 MED ORDER — METHYLPREDNISOLONE ACETATE 80 MG/ML IJ SUSP
INTRAMUSCULAR | Status: AC
  Filled 2013-05-11: qty 1

## 2013-05-11 MED ORDER — OXYCODONE-ACETAMINOPHEN 5-325 MG PO TABS: ORAL_TABLET | ORAL | Status: DC
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
## 2013-05-12 NOTE — ED Provider Notes (Signed)
Medical screening examination/treatment/procedure(s) were performed by resident physician or non-physician practitioner and as supervising physician I was immediately available for consultation/collaboration.   Barkley Bruns MD.   Linna Hoff, MD 05/12/13 2109

## 2013-05-20 ENCOUNTER — Emergency Department (HOSPITAL_COMMUNITY)
Admission: EM | Admit: 2013-05-20 | Discharge: 2013-05-20 | Disposition: A | Discharge status: Home / Self Care | Payer: Medicaid Other | Attending: Emergency Medicine | Admitting: Emergency Medicine

## 2013-05-20 ENCOUNTER — Encounter (HOSPITAL_COMMUNITY): Payer: Self-pay

## 2013-05-20 DIAGNOSIS — E669 Obesity, unspecified: Secondary | ICD-10-CM | POA: Insufficient documentation

## 2013-05-20 DIAGNOSIS — Z79899 Other long term (current) drug therapy: Secondary | ICD-10-CM | POA: Insufficient documentation

## 2013-05-20 DIAGNOSIS — R5381 Other malaise: Secondary | ICD-10-CM | POA: Insufficient documentation

## 2013-05-20 DIAGNOSIS — M25519 Pain in unspecified shoulder: Secondary | ICD-10-CM | POA: Insufficient documentation

## 2013-05-20 DIAGNOSIS — K219 Gastro-esophageal reflux disease without esophagitis: Secondary | ICD-10-CM | POA: Insufficient documentation

## 2013-05-20 DIAGNOSIS — R209 Unspecified disturbances of skin sensation: Secondary | ICD-10-CM | POA: Insufficient documentation

## 2013-05-20 DIAGNOSIS — M542 Cervicalgia: Secondary | ICD-10-CM | POA: Insufficient documentation

## 2013-05-20 DIAGNOSIS — I10 Essential (primary) hypertension: Secondary | ICD-10-CM | POA: Insufficient documentation

## 2013-05-20 DIAGNOSIS — R202 Paresthesia of skin: Secondary | ICD-10-CM

## 2013-05-20 DIAGNOSIS — F411 Generalized anxiety disorder: Secondary | ICD-10-CM | POA: Insufficient documentation

## 2013-05-20 MED ORDER — OXYCODONE-ACETAMINOPHEN 10-325 MG PO TABS
0.5000 | ORAL_TABLET | ORAL | Status: DC | PRN
Start: 2013-05-20 — End: 2013-11-07

## 2013-05-20 MED ORDER — MELOXICAM 15 MG PO TABS: 15.0000 mg | ORAL_TABLET | Freq: Every day | ORAL | Status: DC

## 2013-05-20 NOTE — ED Provider Notes (Signed)
CSN: [id number]     Arrival date & time 05/20/13  0919 History     First MD Initiated Contact with Patient 05/20/13 [phone]     Chief Complaint  Patient presents with  . Neck Pain  . Shoulder Pain  . Numbness   (Consider location/radiation/quality/duration/timing/severity/associated sxs/prior Treatment) The history is provided by the patient. No language interpreter was used.   37 yo F presents with 3 week history of intermittent neck pain. She states she is not in pain now, but at its worst she rates the pain as a 10/10. She describes the pain as a bilateral throbbing pain that is worse on the L and radiates into her shoulders. She notes occasional sharp pains. 2 days ago she noticed numbness in the distal portion of her left thumb that is worse in the evenings. She reports using computer frequently. She also notes weakness in her Left upper extremity due to pain. She states she has trouble remembering to take the pain medications she has been prescribed on prior ED/UC visits, stating she prefers the shot for pain management. She denies numbness or tingling in her legs, bowel/bladder incontinence, and fever. Denies visual changes, stiff neck, headache or rash.    Past Medical History  Diagnosis Date  . Hypertension   . Acid reflux   . Obesity    Past Surgical History  Procedure Laterality Date  . Cholecystectomy    . Cesarean section     No family history on file. History  Substance Use Topics  . Smoking status: Never Smoker   . Smokeless tobacco: Not on file  . Alcohol Use: Yes     Comment: little   OB History   Grav Para Term Preterm Abortions TAB SAB Ect Mult Living                 Review of Systems  Constitutional: Negative for fever and chills.  HENT: Positive for neck pain. Negative for ear pain, congestion, facial swelling, trouble swallowing, neck stiffness, voice change and sinus pressure.   Eyes: Negative for visual disturbance.  Respiratory: Negative for cough,  shortness of breath and wheezing.   Cardiovascular: Negative for chest pain and leg swelling.  Gastrointestinal: Negative for nausea, vomiting, abdominal pain and diarrhea.  Musculoskeletal: Negative for myalgias, joint swelling, arthralgias and gait problem.  Skin: Negative for rash and wound.  Neurological: Negative for dizziness, tremors, facial asymmetry, speech difficulty, weakness, light-headedness and numbness.  Psychiatric/Behavioral: The patient is nervous/anxious.     Allergies  Review of patient's allergies indicates no known allergies.  Home Medications   Current Outpatient Rx  Name  Route  Sig  Dispense  Refill  . cyclobenzaprine (FLEXERIL) 10 MG tablet   Oral   Take 10 mg by mouth 3 (three) times daily as needed. For pain         . hydrochlorothiazide (HYDRODIURIL) 25 MG tablet   Oral   Take 25 mg by mouth daily with breakfast.          . lisinopril (PRINIVIL,ZESTRIL) 40 MG tablet   Oral   Take 80 mg by mouth daily.          . methylPREDNISolone (MEDROL DOSEPAK) 4 MG tablet      As directed by package insert   21 tablet   0   . naproxen (NAPROSYN) 250 MG tablet   Oral   Take 250 mg by mouth 2 (two) times daily as needed (pain). For pain         .  oxyCODONE-acetaminophen (PERCOCET/ROXICET) 5-325 MG per tablet      1 to 2 tabs PO q6hrs  PRN for pain   15 tablet   0   . ranitidine (ZANTAC) 150 MG tablet   Oral   Take 150 mg by mouth at bedtime.           BP 134/90  Pulse 78  Temp(Src) 98.2 F (36.8 C) (Oral)  Resp 20  SpO2 97%  LMP 04/27/2013 Physical Exam  Nursing note and vitals reviewed. Constitutional: She is oriented to person, place, and time. She appears well-developed and well-nourished. No distress.  HENT:  Head: Normocephalic and atraumatic.  Eyes: Conjunctivae are normal. No scleral icterus.  Neck: Muscular tenderness present. No spinous process tenderness present. No rigidity. Decreased range of motion present. No tracheal  deviation, no edema and no erythema present. No thyromegaly present.  Patient with full strength. AROM decreased to the left with lateral flexion and lateral rotation.  Patient has pain with extension as well.  negative axial load test.  Cardiovascular: Normal rate, regular rhythm and normal heart sounds.  Exam reveals no gallop and no friction rub.   No murmur heard. Pulmonary/Chest: Effort normal and breath sounds normal. No respiratory distress.  Abdominal: Soft. Bowel sounds are normal. She exhibits no distension and no mass. There is no tenderness. There is no guarding.  Musculoskeletal:  Positive phalen's test   Neurological: She is alert and oriented to person, place, and time.  Speech is clear and goal oriented, follows commands Major Cranial nerves without deficit, no facial droop Normal strength in upper and lower extremities bilaterally including dorsiflexion and plantar flexion, strong and equal grip strength Sensation normal to light and sharp touch Moves extremities without ataxia, coordination intact Normal finger to nose and rapid alternating movements    Skin: Skin is warm and dry. She is not diaphoretic.    ED Course   Procedures (including critical care time)  Labs Reviewed - No data to display No results found. 1. Neck pain   2. 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  Patient is also morbidly obese with large, pendulous breasts. She has shortened, thight pectoralis fibers and anterior shoulder rounding that i believe is contributing to a musculoskeletal cause for her neck pain. I believe that patient's paresthesia and neck pain her caused by peripheral source such as cervical radiculopathy.  Although the patient's fever test is positive O.  this is an equivocal finding considering she has only  paresthesia in the first digit of the left hand.  The patient has not been seen by orthopedic specialist feel she may need an MRI for followup of her continued chronic neck pain and worsening symptom of paresthesia on the left side.  Her congenital malformation may be contributing to the symptoms. Patient is afebrile, hemodynamically stable, do not suspect stroke, central cause of symptoms, or meningitis. The patient appears reasonably screened and/or stabilized for discharge and I doubt any other medical condition or other [facility] requiring further screening, evaluation, or treatment in the ED at this time prior to discharge.    Arthor Captain, PA-C 05/21/13 [phone]

## 2013-05-20 NOTE — ED Notes (Signed)
Pt c/o lt neck/shoulder muscle spasm/pain with numbness to lt thumb. Pt states seen and tx'd at [facility] and [facility] for same but no better

## 2013-05-22 NOTE — ED Provider Notes (Signed)
History/physical exam/procedure(s) were performed by non-physician practitioner and as supervising physician I was immediately available for consultation/collaboration. I have reviewed all notes and am in agreement with care and plan.   Samanthamarie Ezzell S Calah Gershman, MD 05/22/13 2157 

## 2013-11-07 ENCOUNTER — Emergency Department (HOSPITAL_COMMUNITY)
Admission: EM | Admit: 2013-11-07 | Discharge: 2013-11-07 | Disposition: A | Discharge status: Home / Self Care | Payer: Medicaid Other | Attending: Emergency Medicine | Admitting: Emergency Medicine

## 2013-11-07 ENCOUNTER — Encounter (HOSPITAL_COMMUNITY): Payer: Self-pay | Admitting: Emergency Medicine

## 2013-11-07 DIAGNOSIS — M545 Low back pain, unspecified: Secondary | ICD-10-CM | POA: Insufficient documentation

## 2013-11-07 DIAGNOSIS — I10 Essential (primary) hypertension: Secondary | ICD-10-CM | POA: Insufficient documentation

## 2013-11-07 DIAGNOSIS — R209 Unspecified disturbances of skin sensation: Secondary | ICD-10-CM | POA: Insufficient documentation

## 2013-11-07 DIAGNOSIS — Z79899 Other long term (current) drug therapy: Secondary | ICD-10-CM | POA: Insufficient documentation

## 2013-11-07 DIAGNOSIS — K219 Gastro-esophageal reflux disease without esophagitis: Secondary | ICD-10-CM | POA: Insufficient documentation

## 2013-11-07 DIAGNOSIS — E669 Obesity, unspecified: Secondary | ICD-10-CM | POA: Insufficient documentation

## 2013-11-07 DIAGNOSIS — Z791 Long term (current) use of non-steroidal anti-inflammatories (NSAID): Secondary | ICD-10-CM | POA: Insufficient documentation

## 2013-11-07 DIAGNOSIS — M5412 Radiculopathy, cervical region: Secondary | ICD-10-CM | POA: Insufficient documentation

## 2013-11-07 MED ORDER — OXYCODONE-ACETAMINOPHEN 5-325 MG PO TABS: 1.0000 | ORAL_TABLET | Freq: Four times a day (QID) | ORAL | Status: DC | PRN

## 2013-11-07 MED ORDER — METHYLPREDNISOLONE 4 MG PO KIT: PACK | ORAL | Status: DC

## 2013-11-07 NOTE — Discharge Instructions (Signed)
Cervical Radiculopathy  Cervical radiculopathy happens when a nerve in the neck is pinched or bruised by a slipped (herniated) disk or by arthritic changes in the bones of the cervical spine. This can occur due to an injury or as part of the normal aging process. Pressure on the cervical nerves can cause pain or numbness that runs from your neck all the way down into your arm and fingers.  CAUSES   There are many possible causes, including:  · Injury.  · Muscle tightness in the neck from overuse.  · Swollen, painful joints (arthritis).  · Breakdown or degeneration in the bones and joints of the spine (spondylosis) due to aging.  · Bone spurs that may develop near the cervical nerves.  SYMPTOMS   Symptoms include pain, weakness, or numbness in the affected arm and hand. Pain can be severe or irritating. Symptoms may be worse when extending or turning the neck.  DIAGNOSIS   Your caregiver will ask about your symptoms and do a physical exam. He or she may test your strength and reflexes. X-rays, CT scans, and MRI scans may be needed in cases of injury or if the symptoms do not go away after a period of time. Electromyography (EMG) or nerve conduction testing may be done to study how your nerves and muscles are working.  TREATMENT   Your caregiver may recommend certain exercises to help relieve your symptoms. Cervical radiculopathy can, and often does, get better with time and treatment. If your problems continue, treatment options may include:  · Wearing a soft collar for short periods of time.  · Physical therapy to strengthen the neck muscles.  · Medicines, such as nonsteroidal anti-inflammatory drugs (NSAIDs), oral corticosteroids, or spinal injections.  · Surgery. Different types of surgery may be done depending on the cause of your problems.  HOME CARE INSTRUCTIONS   · Put ice on the affected area.  · Put ice in a plastic bag.  · Place a towel between your skin and the bag.  · Leave the ice on for 15-20 minutes,  03-04 times a day or as directed by your caregiver.  · If ice does not help, you can try using heat. Take a warm shower or bath, or use a hot water bottle as directed by your caregiver.  · You may try a gentle neck and shoulder massage.  · Use a flat pillow when you sleep.  · Only take over-the-counter or prescription medicines for pain, discomfort, or fever as directed by your caregiver.  · If physical therapy was prescribed, follow your caregiver's directions.  · If a soft collar was prescribed, use it as directed.  SEEK IMMEDIATE MEDICAL CARE IF:   · Your pain gets much worse and cannot be controlled with medicines.  · You have weakness or numbness in your hand, arm, face, or leg.  · You have a high fever or a stiff, rigid neck.  · You lose bowel or bladder control (incontinence).  · You have trouble with walking, balance, or speaking.  MAKE SURE YOU:   · Understand these instructions.  · Will watch your condition.  · Will get help right away if you are not doing well or get worse.  Document Released: 06/26/2001 Document Revised: 12/24/2011 Document Reviewed: 05/15/2011  [employer]® Patient Information ©2014 [employer].

## 2013-11-07 NOTE — ED Notes (Signed)
Pt states she has chronic neck and back pain and is suppose to be scheduled by her PCP for MRI but hasn't heard anything back from her,  States this time pain has been for a week.

## 2013-11-07 NOTE — ED Provider Notes (Signed)
CSN: [id number]     Arrival date & time 11/07/13  4098 History   First MD Initiated Contact with Patient 11/07/13 [phone]     Chief Complaint  Patient presents with  . Back Pain  . Neck Pain   (Consider location/radiation/quality/duration/timing/severity/associated sxs/prior Treatment) Patient is a 38 y.o. female presenting with back pain and neck pain. The history is provided by the patient.  Back Pain Location:  Lumbar spine Quality:  Aching Radiates to:  Does not radiate Pain severity:  Moderate Pain is:  Worse during the day Onset quality:  Gradual Duration:  4 days Timing:  Constant Progression:  Unchanged Chronicity:  Recurrent Associated symptoms: numbness and tingling   Associated symptoms: no bladder incontinence, no bowel incontinence, no leg pain and no weakness   Neck Pain Pain location:  Generalized neck Quality:  Aching, burning, stiffness and shooting Stiffness is present:  In the morning Pain radiates to:  L shoulder and R shoulder Pain severity:  Severe Pain is:  Worse during the night Onset quality:  Gradual Timing:  Constant Progression:  Worsening Chronicity:  Chronic Context: not lifting a heavy object and not recent injury   Relieved by:  Nothing Worsened by:  Position Ineffective treatments:  Muscle relaxants, heat and NSAIDs Associated symptoms: numbness and tingling   Associated symptoms: no bladder incontinence, no bowel incontinence, no leg pain, no paresis and no weakness   Associated symptoms comment:  Tingling intermittently of bilateral hands Risk factors: no hx of spinal trauma, no recent epidural and no recent head injury     Past Medical History  Diagnosis Date  . Hypertension   . Acid reflux   . Obesity    Past Surgical History  Procedure Laterality Date  . Cholecystectomy    . Cesarean section     No family history on file. History  Substance Use Topics  . Smoking status: Never Smoker   . Smokeless tobacco: Not on file  .  Alcohol Use: Yes     Comment: little   OB History   Grav Para Term Preterm Abortions TAB SAB Ect Mult Living                 Review of Systems  Gastrointestinal: Negative for bowel incontinence.  Genitourinary: Negative for bladder incontinence.  Musculoskeletal: Positive for back pain and neck pain.  Neurological: Positive for tingling and numbness. Negative for weakness.  All other systems reviewed and are negative.    Allergies  Review of patient's allergies indicates no known allergies.  Home Medications   Current Outpatient Rx  Name  Route  Sig  Dispense  Refill  . cyclobenzaprine (FLEXERIL) 10 MG tablet   Oral   Take 10 mg by mouth 3 (three) times daily as needed. For pain         . hydrochlorothiazide (HYDRODIURIL) 25 MG tablet   Oral   Take 25 mg by mouth daily with breakfast.          . lisinopril (PRINIVIL,ZESTRIL) 40 MG tablet   Oral   Take 80 mg by mouth daily.          . meloxicam (MOBIC) 15 MG tablet   Oral   Take 1 tablet (15 mg total) by mouth daily. Take 1 daily with food.   10 tablet   0   . oxyCODONE-acetaminophen (PERCOCET) 10-325 MG per tablet   Oral   Take 0.5-1 tablets by mouth every 4 (four) hours as needed for pain.  30 tablet   0   . oxyCODONE-acetaminophen (PERCOCET/ROXICET) 5-325 MG per tablet      1 to 2 tabs PO q6hrs  PRN for pain   15 tablet   0   . ranitidine (ZANTAC) 150 MG tablet   Oral   Take 150 mg by mouth at bedtime.           BP 153/100  Pulse 79  Temp(Src) 98.1 F (36.7 C) (Oral)  Resp 20  SpO2 96%  LMP 10/26/2013 Physical Exam  Nursing note and vitals reviewed. Constitutional: She is oriented to person, place, and time. She appears well-developed and well-nourished. No distress.  HENT:  Head: Normocephalic and atraumatic.  Mouth/Throat: Oropharynx is clear and moist.  Eyes: Conjunctivae and EOM are normal. Pupils are equal, round, and reactive to light.  Neck: Normal range of motion. Neck  supple.  Cardiovascular: Normal rate, regular rhythm and intact distal pulses.   No murmur heard. 2+ radial pulse bilaterally  Pulmonary/Chest: Effort normal and breath sounds normal. No respiratory distress. She has no wheezes. She has no rales.  Musculoskeletal: Normal range of motion. She exhibits no edema.       Cervical back: She exhibits tenderness. She exhibits no bony tenderness.       Lumbar back: She exhibits pain. She exhibits no bony tenderness.       Back:  Neurological: She is alert and oriented to person, place, and time. She has normal strength. No sensory deficit.  Skin: Skin is warm and dry. No rash noted. No erythema.  Psychiatric: She has a normal mood and affect. Her behavior is normal.    ED Course  Procedures (including critical care time) Labs Review Labs Reviewed - No data to display Imaging Review No results found.  EKG Interpretation   None       MDM   1. Cervical radiculopathy      patient here with complaint of neck pain that sounds radicular in nature where radiates to bilateral mid shoulder with tingling in the fingers and hand. She has an appointment with a new PCP soon and they are supposedly ordering an MRI for her surgery she referral to orthopedics. She is currently taking a muscle relaxer, meloxicam and naproxen but states over the last week the pain has worsened. On exam she is neurovascularly intact 2+ radial pulses and normal sensation and strength in her hands at this time.  Encourage her that she needs to the keep her appointment and an MRI would be useful in the future in evaluation by orthopedics which she is already in the process of. Patient given a Medrol Dosepak and pain medicine.   Gwyneth SproutWhitney Prescott Truex, MD 11/07/13 [phone]

## 2013-12-04 ENCOUNTER — Encounter (HOSPITAL_COMMUNITY): Payer: Self-pay | Admitting: Emergency Medicine

## 2013-12-04 ENCOUNTER — Emergency Department (HOSPITAL_COMMUNITY)
Admission: EM | Admit: 2013-12-04 | Discharge: 2013-12-04 | Disposition: A | Discharge status: Home / Self Care | Payer: Medicaid Other | Attending: Emergency Medicine | Admitting: Emergency Medicine

## 2013-12-04 ENCOUNTER — Emergency Department (HOSPITAL_COMMUNITY): Payer: Medicaid Other

## 2013-12-04 DIAGNOSIS — R0602 Shortness of breath: Secondary | ICD-10-CM | POA: Insufficient documentation

## 2013-12-04 DIAGNOSIS — E669 Obesity, unspecified: Secondary | ICD-10-CM | POA: Insufficient documentation

## 2013-12-04 DIAGNOSIS — J069 Acute upper respiratory infection, unspecified: Secondary | ICD-10-CM

## 2013-12-04 DIAGNOSIS — I10 Essential (primary) hypertension: Secondary | ICD-10-CM | POA: Insufficient documentation

## 2013-12-04 DIAGNOSIS — R079 Chest pain, unspecified: Secondary | ICD-10-CM | POA: Insufficient documentation

## 2013-12-04 DIAGNOSIS — Z79899 Other long term (current) drug therapy: Secondary | ICD-10-CM | POA: Insufficient documentation

## 2013-12-04 DIAGNOSIS — B9789 Other viral agents as the cause of diseases classified elsewhere: Secondary | ICD-10-CM

## 2013-12-04 DIAGNOSIS — K219 Gastro-esophageal reflux disease without esophagitis: Secondary | ICD-10-CM | POA: Insufficient documentation

## 2013-12-04 DIAGNOSIS — M791 Myalgia, unspecified site: Secondary | ICD-10-CM

## 2013-12-04 DIAGNOSIS — M79609 Pain in unspecified limb: Secondary | ICD-10-CM | POA: Insufficient documentation

## 2013-12-04 DIAGNOSIS — Z791 Long term (current) use of non-steroidal anti-inflammatories (NSAID): Secondary | ICD-10-CM | POA: Insufficient documentation

## 2013-12-04 DIAGNOSIS — R0981 Nasal congestion: Secondary | ICD-10-CM

## 2013-12-04 DIAGNOSIS — R002 Palpitations: Secondary | ICD-10-CM | POA: Insufficient documentation

## 2013-12-04 HISTORY — DX: Gout, unspecified: M10.9

## 2013-12-04 LAB — BASIC METABOLIC PANEL
BUN: 9 mg/dL (ref 6–23)
CALCIUM: 9 mg/dL (ref 8.4–10.5)
CO2: 30 meq/L (ref 19–32)
Chloride: 98 mEq/L (ref 96–112)
Creatinine, Ser: 0.93 mg/dL (ref 0.50–1.10)
GFR calc Af Amer: >90 mL/min (ref >90)
GFR, EST NON AFRICAN AMERICAN: 79 mL/min — AB (ref >90)
Glucose, Bld: 97 mg/dL (ref 70–99)
POTASSIUM: 3.8 meq/L (ref 3.7–5.3)
SODIUM: 138 meq/L (ref 137–147)

## 2013-12-04 LAB — CBC
HCT: 39.2 % (ref 36.0–46.0)
HEMOGLOBIN: 13.2 g/dL (ref 12.0–15.0)
MCH: 26.9 pg (ref 26.0–34.0)
MCHC: 33.7 g/dL (ref 30.0–36.0)
MCV: 80 fL (ref 78.0–100.0)
PLATELETS: 251 10*3/uL (ref 150–400)
RBC: 4.9 MIL/uL (ref 3.87–5.11)
RDW: 14.6 % (ref 11.5–15.5)
WBC: 4.8 10*3/uL (ref 4.0–10.5)

## 2013-12-04 LAB — D-DIMER, QUANTITATIVE (NOT AT ARMC): D DIMER QUANT: 0.43 ug{FEU}/mL (ref 0.00–0.48)

## 2013-12-04 MED ORDER — BENZONATATE 100 MG PO CAPS: 100.0000 mg | ORAL_CAPSULE | Freq: Three times a day (TID) | ORAL | Status: DC

## 2013-12-04 MED ORDER — AEROCHAMBER PLUS W/MASK MISC: Status: DC

## 2013-12-04 MED ORDER — SODIUM CHLORIDE 0.9 % IV BOLUS (SEPSIS)
1000.0000 mL | Freq: Once | INTRAVENOUS | Status: AC
  Administered 2013-12-04: 1000 mL via INTRAVENOUS

## 2013-12-04 MED ORDER — ALBUTEROL SULFATE (2.5 MG/3ML) 0.083% IN NEBU
5.0000 mg | INHALATION_SOLUTION | Freq: Once | RESPIRATORY_TRACT | Status: AC
  Administered 2013-12-04: 5 mg via RESPIRATORY_TRACT
  Filled 2013-12-04: qty 6

## 2013-12-04 MED ORDER — ALBUTEROL SULFATE HFA 108 (90 BASE) MCG/ACT IN AERS: 2.0000 | INHALATION_SPRAY | RESPIRATORY_TRACT | Status: DC | PRN

## 2013-12-04 MED ORDER — BENZONATATE 100 MG PO CAPS
200.0000 mg | ORAL_CAPSULE | Freq: Once | ORAL | Status: AC
  Administered 2013-12-04: 200 mg via ORAL
  Filled 2013-12-04: qty 2

## 2013-12-04 MED ORDER — IBUPROFEN 800 MG PO TABS
800.0000 mg | ORAL_TABLET | Freq: Once | ORAL | Status: AC
  Administered 2013-12-04: 800 mg via ORAL
  Filled 2013-12-04: qty 1

## 2013-12-04 NOTE — Progress Notes (Signed)
[facility] ED CM received call from patient regarding medication assistance with Tessolon pearls. Pt stated, that she was unable to get medicine which is not covered by Medicaid. Informed patient medication was not covered by medicaid and the the price with discount card [facility] Offered to fax discount to [facility] at [phone]. Fax confirmation received. No further ED CM needs identified.

## 2013-12-04 NOTE — Discharge Instructions (Signed)
1. Medications: albuterol, tessalon for cough, usual home medications 2. Treatment: rest, drink plenty of fluids, tylenol/ibuprofen for body aches, mucinex for decongestion 3. Follow Up: Please followup with your primary doctor for discussion of your diagnoses and further evaluation after today's visit;  Upper Respiratory Infection, Adult An upper respiratory infection (URI) is also known as the common cold. It is often caused by a type of germ (virus). Colds are easily spread (contagious). You can pass it to others by kissing, coughing, sneezing, or drinking out of the same glass. Usually, you get better in 1 or 2 weeks.  HOME CARE   Only take medicine as told by your doctor.  Use a warm mist humidifier or breathe in steam from a hot shower.  Drink enough water and fluids to keep your pee (urine) clear or pale yellow.  Get plenty of rest.  Return to work when your temperature is back to normal or as told by your doctor. You may use a face mask and wash your hands to stop your cold from spreading. GET HELP RIGHT AWAY IF:   After the first few days, you feel you are getting worse.  You have questions about your medicine.  You have chills, shortness of breath, or brown or red spit (mucus).  You have yellow or brown snot (nasal discharge) or pain in the face, especially when you bend forward.  You have a fever, puffy (swollen) neck, pain when you swallow, or white spots in the back of your throat.  You have a bad headache, ear pain, sinus pain, or chest pain.  You have a high-pitched whistling sound when you breathe in and out (wheezing).  You have a lasting cough or cough up blood.  You have sore muscles or a stiff neck. MAKE SURE YOU:   Understand these instructions.  Will watch your condition.  Will get help right away if you are not doing well or get worse. Document Released: 03/19/2008 Document Revised: 12/24/2011 Document Reviewed: 02/05/2011 [facility] Patient  Information 2014 [employer] [place].

## 2013-12-04 NOTE — ED Provider Notes (Signed)
CSN: [id number]     Arrival date & time 12/04/13  1054 History   First MD Initiated Contact with Patient 12/04/13 1132     Chief Complaint  Patient presents with  . Cough  . Leg Pain     (Consider location/radiation/quality/duration/timing/severity/associated sxs/prior Treatment) Patient is a 38 y.o. female presenting with cough and leg pain. The history is provided by the patient and medical records. No language interpreter was used.  Cough Associated symptoms: myalgias (right leg), rhinorrhea and shortness of breath   Associated symptoms: no chest pain, no chills, no ear pain, no fever, no headaches, no rash, no sore throat and no wheezing   Leg Pain Associated symptoms: fatigue   Associated symptoms: no back pain and no fever     Lydia Gross is a 38 y.o. female  with a hx of HTN, acid reflux, obesity, gout presents to the Emergency Department complaining of gradual, persistent, progressively worsening cough and SOB onset 3 days ago.  Patient reports she has been sick for almost one week with URI symptoms including nasal congestion, rhinorrhea, postnasal drip, cough and body aches. She reports increased shortness of breath for the last several days worse with movement. She denies history of coronary disease or CHF.  She denies no swelling in her legs. She also reports associated mild pain in her right anterior thigh. This is been there for several days as well and she reports that she at times has pain like this in either leg. She often relates this to her gout. Associated symptoms include palpitations.  Patient has not tried any over-the-counter medications and nothing seems to make her symptoms better. She denies smoking. She also denies fever, chills, headache, neck pain, chest pain, abdominal pain, nausea, vomiting, diarrhea, weakness, dizziness, syncope, dysuria.  Past Medical History  Diagnosis Date  . Hypertension   . Acid reflux   . Obesity   . Gout    Past Surgical  History  Procedure Laterality Date  . Cholecystectomy    . Cesarean section     History reviewed. No pertinent family history. History  Substance Use Topics  . Smoking status: Never Smoker   . Smokeless tobacco: Not on file  . Alcohol Use: Yes     Comment: little   OB History   Grav Para Term Preterm Abortions TAB SAB Ect Mult Living                 Review of Systems  Constitutional: Positive for fatigue. Negative for fever, chills and appetite change.  HENT: Positive for congestion, postnasal drip, rhinorrhea and sinus pressure. Negative for ear discharge, ear pain, mouth sores and sore throat.   Eyes: Negative for visual disturbance.  Respiratory: Positive for cough, chest tightness and shortness of breath. Negative for wheezing and stridor.   Cardiovascular: Negative for chest pain, palpitations and leg swelling.  Gastrointestinal: Negative for nausea, vomiting, abdominal pain and diarrhea.  Genitourinary: Negative for dysuria, urgency, frequency and hematuria.  Musculoskeletal: Positive for myalgias (right leg). Negative for arthralgias, back pain and neck stiffness.  Skin: Negative for rash.  Neurological: Negative for syncope, light-headedness, numbness and headaches.  Hematological: Negative for adenopathy.  Psychiatric/Behavioral: The patient is not nervous/anxious.   All other systems reviewed and are negative.      Allergies  Review of patient's allergies indicates no known allergies.  Home Medications   Current Outpatient Rx  Name  Route  Sig  Dispense  Refill  . hydrochlorothiazide (HYDRODIURIL) 25 MG  tablet   Oral   Take 25 mg by mouth daily with breakfast.          . IRON PO   Oral   Take 1 tablet by mouth daily.         Marland Kitchen lisinopril (PRINIVIL,ZESTRIL) 40 MG tablet   Oral   Take 80 mg by mouth daily.          . Multiple Vitamins-Minerals (HAIR/SKIN/NAILS PO)   Oral   Take 1 tablet by mouth daily.         . naproxen (NAPROSYN) 500 MG  tablet   Oral   Take 500 mg by mouth 2 (two) times daily with a meal.         . oxyCODONE-acetaminophen (PERCOCET/ROXICET) 5-325 MG per tablet   Oral   Take 1-2 tablets by mouth every 6 (six) hours as needed.   20 tablet   0   . ranitidine (ZANTAC) 150 MG tablet   Oral   Take 150 mg by mouth 2 (two) times daily.          Marland Kitchen tiZANidine (ZANAFLEX) 4 MG tablet   Oral   Take 4 mg by mouth every 6 (six) hours as needed for muscle spasms.         Marland Kitchen albuterol (PROVENTIL HFA;VENTOLIN HFA) 108 (90 BASE) MCG/ACT inhaler   Inhalation   Inhale 2 puffs into the lungs every 4 (four) hours as needed for wheezing or shortness of breath.   1 Inhaler   3   . benzonatate (TESSALON) 100 MG capsule   Oral   Take 1 capsule (100 mg total) by mouth every 8 (eight) hours.   21 capsule   0   . Spacer/Aero-Holding Chambers (AEROCHAMBER PLUS WITH MASK) inhaler      Use as instructed   1 each   2    BP 108/51  Pulse 99  Temp(Src) 98.6 F (37 C) (Oral)  Resp 16  SpO2 100%  LMP 11/20/2013 Physical Exam  Constitutional: She is oriented to person, place, and time. She appears well-developed and well-nourished. No distress.  HENT:  Head: Normocephalic and atraumatic.  Right Ear: Tympanic membrane, external ear and ear canal normal.  Left Ear: Tympanic membrane, external ear and ear canal normal.  Nose: Mucosal edema and rhinorrhea present. No epistaxis. Right sinus exhibits no maxillary sinus tenderness and no frontal sinus tenderness. Left sinus exhibits no maxillary sinus tenderness and no frontal sinus tenderness.  Mouth/Throat: Uvula is midline and mucous membranes are normal. Mucous membranes are not pale and not cyanotic. No oropharyngeal exudate, posterior oropharyngeal edema, posterior oropharyngeal erythema or tonsillar abscesses.  Eyes: Conjunctivae are normal. Pupils are equal, round, and reactive to light.  Neck: Normal range of motion and full passive range of motion without  pain.  Cardiovascular: Regular rhythm, normal heart sounds and intact distal pulses.   Tachycardia  Pulmonary/Chest: Effort normal. No accessory muscle usage or stridor. Not tachypneic. No respiratory distress. She has decreased breath sounds in the right middle field, the right lower field, the left middle field and the left lower field. She has no wheezes. She has no rhonchi. She has no rales. She exhibits no tenderness.  Dry cough on exam Decreased breath sounds lower lung fields  Abdominal: Soft. Bowel sounds are normal. She exhibits no distension. There is no tenderness.  Musculoskeletal: Normal range of motion. She exhibits no tenderness.  No pitting edema of the lower extremities Lower extremity diameter equal  Lymphadenopathy:  She has no cervical adenopathy.  Neurological: She is alert and oriented to person, place, and time. She exhibits normal muscle tone. Coordination normal.  Skin: Skin is warm and dry. No rash noted. She is not diaphoretic. No erythema.  Psychiatric: She has a normal mood and affect. Her behavior is normal.    ED Course  Procedures (including critical care time) Labs Review Labs Reviewed  BASIC METABOLIC PANEL - Abnormal; Notable for the following:    GFR calc non Af Amer 79 (*)    All other components within normal limits  CBC  D-DIMER, QUANTITATIVE   Imaging Review Dg Chest 2 View (if Patient Has Fever And/or Copd)  12/04/2013   CLINICAL DATA:  Productive cough and dyspnea and right leg discomfort  EXAM: CHEST  2 VIEW  COMPARISON:  DG CHEST 2 VIEW dated 03/01/2009  FINDINGS: The lungs are adequately inflated. There is no focal infiltrate. The cardiac silhouette is normal in size. The pulmonary vascularity is not engorged. There is no pleural effusion or pneumothorax or pneumomediastinum. The trachea is midline. The observed portions of the bony thorax exhibit no acute abnormalities.  IMPRESSION: There is no evidence of pneumonia nor CHF nor other acute  cardiopulmonary abnormality. One cannot exclude acute bronchitis in the appropriate clinical setting.   Electronically Signed   By: David  Swazil[place]   On: 12/04/2013 12:05    EKG Interpretation   None       MDM   Final diagnoses:  Viral URI with cough  Myalgia  Nasal congestion    Lydia Gross presents with URI symptoms, cough and shortness of breath for several days. Patient tachycardic on exam with diminished breath sounds but no rhonchi, Rales or wheezes.  Patient relates right upper anterior thigh pain which appears musculoskeletal in nature. No pitting edema of either extremity and no difference in diameter between her lower extremities. No history of DVT.  PERC negative.  Chest x-ray without evidence of pneumonia or CHF.  I personally reviewed the imaging tests through PACS system  I reviewed available ER/hospitalization records through the EMR  Will obtain d-dimer, give fluid bolus and albuterol treatment.    2:02 PM Pt with some improvement after albuterol inhaler, but continues to c/o cough and myalgias.  Her heart rate is decreasing with her fluid bolus.  Will treat cough and myalgias here.  D-dimer negative.  Pt CXR negative for acute infiltrate. Patients symptoms are consistent with URI, likely viral etiology. Discussed that antibiotics are not indicated for viral infections. Pt will be discharged with symptomatic treatment including cough suppressant.  Verbalizes understanding and is agreeable with plan. Pt is hemodynamically stable & in NAD prior to dc.  Pt reports she has an appointment with her PCP on 2/25 and will be able to follow-up for her symptoms.    It has been determined that no acute conditions requiring further emergency intervention are present at this time. The patient/guardian have been advised of the diagnosis and plan. We have discussed signs and symptoms that warrant return to the ED, such as changes or worsening in symptoms.   Vital signs are stable at  discharge.   BP 108/51  Pulse 99  Temp(Src) 98.6 F (37 C) (Oral)  Resp 16  SpO2 100%  LMP 11/20/2013  Patient/guardian has voiced understanding and agreed to follow-up with the PCP or specialist.       Dierdre ForthHannah Stephani Janak, PA-C 12/04/13 1417

## 2013-12-04 NOTE — ED Notes (Signed)
Pt c/o cough and URI sx with pain with cough; pt sts pain in upper left leg and body aches

## 2013-12-05 NOTE — ED Provider Notes (Signed)
Medical screening examination/treatment/procedure(s) were conducted as a shared visit with non-physician practitioner(s) and myself.  I personally evaluated the patient during the encounter.  EKG Interpretation   None       Pt c/o non prod cough. No cp. Afeb. No resp distress or inc wob. Cxr.    Suzi RootsKevin E Emori Kamau, MD 12/05/13 [phone]

## 2014-01-12 DIAGNOSIS — M542 Cervicalgia: Secondary | ICD-10-CM | POA: Insufficient documentation

## 2014-01-26 DIAGNOSIS — E041 Nontoxic single thyroid nodule: Secondary | ICD-10-CM | POA: Insufficient documentation

## 2014-01-29 DIAGNOSIS — M5412 Radiculopathy, cervical region: Secondary | ICD-10-CM | POA: Insufficient documentation

## 2014-02-24 ENCOUNTER — Other Ambulatory Visit: Payer: Self-pay | Admitting: Neurosurgery

## 2014-04-07 ENCOUNTER — Inpatient Hospital Stay (HOSPITAL_COMMUNITY): Admission: RE | Admit: 2014-04-07 | Payer: Medicaid Other | Source: Ambulatory Visit

## 2014-04-27 ENCOUNTER — Encounter (HOSPITAL_COMMUNITY): Payer: Self-pay | Admitting: Emergency Medicine

## 2014-04-27 ENCOUNTER — Emergency Department (HOSPITAL_COMMUNITY)
Admission: EM | Admit: 2014-04-27 | Discharge: 2014-04-27 | Disposition: A | Discharge status: Home / Self Care | Payer: Medicaid Other | Attending: Emergency Medicine | Admitting: Emergency Medicine

## 2014-04-27 DIAGNOSIS — R21 Rash and other nonspecific skin eruption: Secondary | ICD-10-CM | POA: Diagnosis present

## 2014-04-27 DIAGNOSIS — E669 Obesity, unspecified: Secondary | ICD-10-CM | POA: Diagnosis not present

## 2014-04-27 DIAGNOSIS — Z79899 Other long term (current) drug therapy: Secondary | ICD-10-CM | POA: Insufficient documentation

## 2014-04-27 DIAGNOSIS — I10 Essential (primary) hypertension: Secondary | ICD-10-CM | POA: Diagnosis not present

## 2014-04-27 DIAGNOSIS — F411 Generalized anxiety disorder: Secondary | ICD-10-CM | POA: Diagnosis not present

## 2014-04-27 DIAGNOSIS — R209 Unspecified disturbances of skin sensation: Secondary | ICD-10-CM | POA: Diagnosis not present

## 2014-04-27 DIAGNOSIS — K219 Gastro-esophageal reflux disease without esophagitis: Secondary | ICD-10-CM | POA: Insufficient documentation

## 2014-04-27 DIAGNOSIS — M79609 Pain in unspecified limb: Secondary | ICD-10-CM | POA: Diagnosis not present

## 2014-04-27 DIAGNOSIS — M79604 Pain in right leg: Secondary | ICD-10-CM

## 2014-04-27 HISTORY — DX: Anxiety disorder, unspecified: F41.9

## 2014-04-27 NOTE — ED Notes (Signed)
Pt c/o insect bites to R upper leg and increasing pain x 9 days.  Pain score 4/10.  Pt reports tingling to area yesterday, which has resolved.

## 2014-04-27 NOTE — Discharge Instructions (Signed)
Return here for persistent pain, shortness of breath, rash and is worse. Take ibuprofen as directed

## 2014-04-27 NOTE — ED Provider Notes (Signed)
CSN: [id number]     Arrival date & time 04/27/14  4098 History   First MD Initiated Contact with Patient 04/27/14 [phone]     Chief Complaint  Patient presents with  . Insect Bite     (Consider location/radiation/quality/duration/timing/severity/associated sxs/prior Treatment) The history is provided by the patient.   Patient here with a one-day history of rash to her right lateral thigh which she believes is from insect bite. She did have some pruritus which is since resolved. Yesterday had a 2 to three-hour episode of paresthesias at the area which is since resolved. Now complains of some pain to her right leg without any swelling. Pain characterized as sharp and worse with standing. Denies any chest pain or shortness of breath. No treatment used prior to arrival. No fever or chills. Past Medical History  Diagnosis Date  . Hypertension   . Acid reflux   . Obesity   . Gout   . Anxiety    Past Surgical History  Procedure Laterality Date  . Cholecystectomy    . Cesarean section    . Tubal ligation     History reviewed. No pertinent family history. History  Substance Use Topics  . Smoking status: Never Smoker   . Smokeless tobacco: Not on file  . Alcohol Use: Yes     Comment: little   OB History   Grav Para Term Preterm Abortions TAB SAB Ect Mult Living                 Review of Systems  All other systems reviewed and are negative.     Allergies  Review of patient's allergies indicates no known allergies.  Home Medications   Prior to Admission medications   Medication Sig Start Date End Date Taking? Authorizing Provider  albuterol (PROVENTIL HFA;VENTOLIN HFA) 108 (90 BASE) MCG/ACT inhaler Inhale into the lungs every 6 (six) hours as needed for wheezing or shortness of breath.   Yes Historical Provider, MD  diclofenac (VOLTAREN) 75 MG EC tablet Take 75 mg by mouth 2 (two) times daily as needed for mild pain or moderate pain.   Yes Historical Provider, MD  escitalopram  (LEXAPRO) 10 MG tablet Take 10 mg by mouth every morning.   Yes Historical Provider, MD  gabapentin (NEURONTIN) 100 MG capsule Take 100 mg by mouth 2 (two) times daily as needed (pain).   Yes Historical Provider, MD  hydrochlorothiazide (HYDRODIURIL) 25 MG tablet Take 25 mg by mouth every morning.    Yes Historical Provider, MD  IRON PO Take 1 tablet by mouth every morning.    Yes Historical Provider, MD  lisinopril (PRINIVIL,ZESTRIL) 40 MG tablet Take 80 mg by mouth 2 (two) times daily.    Yes Historical Provider, MD  Multiple Vitamins-Minerals (HAIR/SKIN/NAILS PO) Take 1 tablet by mouth every morning.    Yes Historical Provider, MD  Olopatadine HCl (PATADAY) 0.2 % SOLN Place 1 drop into both eyes every evening.   Yes Historical Provider, MD  ranitidine (ZANTAC) 150 MG tablet Take 150 mg by mouth 2 (two) times daily.    Yes Historical Provider, MD  tiZANidine (ZANAFLEX) 4 MG tablet Take 2-4 mg by mouth 2 (two) times daily as needed for muscle spasms.    Yes Historical Provider, MD  traMADol (ULTRAM) 50 MG tablet Take 50 mg by mouth every 6 (six) hours as needed for moderate pain.   Yes Historical Provider, MD   BP 142/91  Pulse 75  Temp(Src) 98.3 F (36.8 C) (Oral)  Resp 20  SpO2 95% Physical Exam  Nursing note and vitals reviewed. Constitutional: She is oriented to person, place, and time. She appears well-developed and well-nourished.  Non-toxic appearance. No distress.  HENT:  Head: Normocephalic and atraumatic.  Eyes: Conjunctivae, EOM and lids are normal. Pupils are equal, round, and reactive to light.  Neck: Normal range of motion. Neck supple. No tracheal deviation present. No mass present.  Cardiovascular: Normal rate, regular rhythm and normal heart sounds.  Exam reveals no gallop.   No murmur heard. Pulmonary/Chest: Effort normal and breath sounds normal. No stridor. No respiratory distress. She has no decreased breath sounds. She has no wheezes. She has no rhonchi. She has no  rales.  Abdominal: Soft. Normal appearance and bowel sounds are normal. She exhibits no distension. There is no tenderness. There is no rebound and no CVA tenderness.  Musculoskeletal: Normal range of motion. She exhibits no edema and no tenderness.       Legs: Neurological: She is alert and oriented to person, place, and time. She has normal strength. No cranial nerve deficit or sensory deficit. GCS eye subscore is 4. GCS verbal subscore is 5. GCS motor subscore is 6.  Skin: Skin is warm and dry. No abrasion and no rash noted.  Psychiatric: She has a normal mood and affect. Her speech is normal and behavior is normal.    ED Course  Procedures (including critical care time) Labs Review Labs Reviewed - No data to display  Imaging Review No results found.   EKG Interpretation None      MDM   Final diagnoses:  Pain of right lower extremity    Patient instructed to take Motrin as directed. Doubt that she has a DVT at this time. Return precautions given   Toy BakerAnthony T Markas Aldredge, MD 04/27/14 [phone]

## 2014-05-21 ENCOUNTER — Inpatient Hospital Stay (HOSPITAL_COMMUNITY): Admission: RE | Admit: 2014-05-21 | Payer: Medicaid Other | Source: Ambulatory Visit

## 2014-07-16 NOTE — Progress Notes (Signed)
Prior to making a pre-op phone call, noted that the pharmacy tech had placed a note stating Lydia Gross had told them the procedure had been cancelled. I called Lydia Gross at [name]'s office and they were not aware of that. I then called Lydia Gross and Lydia Gross states Lydia Gross is not having the surgery on Monday. I asked Lydia Gross if Lydia Gross had notified [name]'s office and Lydia Gross stated that Lydia Gross was getting ready to do that. I instructed Lydia Gross to please do that as soon as possible.

## 2014-07-19 ENCOUNTER — Encounter (HOSPITAL_COMMUNITY): Admission: RE | Payer: Self-pay | Source: Ambulatory Visit

## 2014-07-19 ENCOUNTER — Ambulatory Visit (HOSPITAL_COMMUNITY): Admission: RE | Admit: 2014-07-19 | Payer: Medicaid Other | Source: Ambulatory Visit | Admitting: Neurosurgery

## 2014-07-19 SURGERY — ANTERIOR CERVICAL DECOMPRESSION/DISCECTOMY FUSION 2 LEVELS
Anesthesia: General

## 2014-08-17 ENCOUNTER — Ambulatory Visit: Payer: Medicaid Other | Admitting: Endocrinology

## 2014-08-18 ENCOUNTER — Other Ambulatory Visit: Payer: Self-pay | Admitting: Neurosurgery

## 2014-08-18 ENCOUNTER — Encounter: Payer: Self-pay | Admitting: Endocrinology

## 2014-08-18 ENCOUNTER — Ambulatory Visit (INDEPENDENT_AMBULATORY_CARE_PROVIDER_SITE_OTHER): Payer: Medicaid Other | Admitting: Endocrinology

## 2014-08-18 VITALS — BP 142/88 | HR 92 | Temp 98.4°F | Ht 69.25 in | Wt 370.0 lb

## 2014-08-18 DIAGNOSIS — E041 Nontoxic single thyroid nodule: Secondary | ICD-10-CM

## 2014-08-18 NOTE — Patient Instructions (Addendum)
This is a "lumpy thyroid," which is usually hereditary. Please have your blood pressure rechecked with your PCP soon.  Let's check a biopsy.  It is easy and practically painless.  We'll contact you with results. It is expected that no cancer will be found.  If no cancer, please come back for a follow-up appointment in 6 months most of the time, a "lumpy thyroid" will eventually become overactive.  this is usually a slow process, happening over the span of many years.

## 2014-08-18 NOTE — Progress Notes (Signed)
Subjective:    Patient ID: Lydia Gross, female    DOB: 02/15/1976, 38 y.o.   MRN: [id number]  HPI Pt states approx 8 mos of slight swelling at the anterior neck.  No assoc pain.  eval of this showed thyroid nodule.  She says she is unaware of ever having had a thyroid problem before.  she has no h/o XRT to the neck.   Past Medical History  Diagnosis Date  . Hypertension   . Acid reflux   . Obesity   . Gout   . Anxiety     Past Surgical History  Procedure Laterality Date  . Cholecystectomy    . Cesarean section    . Tubal ligation      History   Social History  . Marital Status: Single    Spouse Name: N/A    Number of Children: N/A  . Years of Education: N/A   Occupational History  . Not on file.   Social History Main Topics  . Smoking status: Never Smoker   . Smokeless tobacco: Not on file  . Alcohol Use: Yes     Comment: little  . Drug Use: No  . Sexual Activity: Not on file   Other Topics Concern  . Not on file   Social History Narrative    Current Outpatient Prescriptions on File Prior to Visit  Medication Sig Dispense Refill  . albuterol (PROVENTIL HFA;VENTOLIN HFA) 108 (90 BASE) MCG/ACT inhaler Inhale into the lungs every 6 (six) hours as needed for wheezing or shortness of breath.    . diclofenac (VOLTAREN) 75 MG EC tablet Take 75 mg by mouth 2 (two) times daily as needed for mild pain or moderate pain.    Marland Kitchen. gabapentin (NEURONTIN) 100 MG capsule Take 100 mg by mouth 2 (two) times daily as needed (pain).    . hydrochlorothiazide (HYDRODIURIL) 25 MG tablet Take 25 mg by mouth every morning.     . IRON PO Take 1 tablet by mouth every morning.     Marland Kitchen. lisinopril (PRINIVIL,ZESTRIL) 40 MG tablet Take 80 mg by mouth 2 (two) times daily.     . Multiple Vitamins-Minerals (HAIR/SKIN/NAILS PO) Take 1 tablet by mouth every morning.     . Olopatadine HCl (PATADAY) 0.2 % SOLN Place 1 drop into both eyes every evening.    . ranitidine (ZANTAC) 150 MG tablet Take  150 mg by mouth 2 (two) times daily.     Marland Kitchen. tiZANidine (ZANAFLEX) 4 MG tablet Take 2-4 mg by mouth 2 (two) times daily as needed for muscle spasms.     . traMADol (ULTRAM) 50 MG tablet Take 50 mg by mouth every 6 (six) hours as needed for moderate pain.     No current facility-administered medications on file prior to visit.    No Known Allergies  Family History  Problem Relation Age of Onset  . Thyroid disease Neg Hx   . Stroke Daughter     BP 142/88 mmHg  Pulse 92  Temp(Src) 98.4 F (36.9 C) (Oral)  Ht 5' 9.25" (1.759 m)  Wt 370 lb (167.831 kg)  BMI 54.24 kg/m2  SpO2 97%  Review of Systems denies weight loss, hoarseness, visual loss, palpitations, sob, diarrhea, polyuria, myalgias, excessive diaphoresis, tremor, easy bruising, and rhinorrhea.  She has intermittent headache and anxiety.  She has intermittent numbness of the LUE (seeing neurosurg).  She has irreg menses.     Objective:   Physical Exam VS: see vs page GEN:  no distress HEAD: head: no deformity eyes: no periorbital swelling, no proptosis external nose and ears are normal mouth: no lesion seen NECK: I am uncertain if I can feel the left thyroid nodule CHEST WALL: no deformity LUNGS:  Clear to auscultation.  CV: reg rate and rhythm, no murmur ABD: abdomen is soft, nontender.  no hepatosplenomegaly.  not distended.  no hernia MUSCULOSKELETAL: muscle bulk and strength are grossly normal.  no obvious joint swelling.  gait is normal and steady EXTEMITIES: no deformity.  no ulcer on the feet.  feet are of normal color and temp.  Trace bilat leg edema PULSES: dorsalis pedis intact bilat.  no carotid bruit NEURO:  cn 2-12 grossly intact.   readily moves all 4's.  sensation is intact to touch on the feet SKIN:  Normal texture and temperature.  No rash or suspicious lesion is visible.   NODES:  None palpable at the neck PSYCH: alert, well-oriented.  Does not appear anxious nor depressed.   Thyroid US (2015):  complex 2.5 x 1.6 x 2.4 cm left lobe nodule. Normal sized gland.    TSH=0.746  i have reviewed the following old records: Office notes    Assessment & Plan:  Thyroid nodule, new, uncertain etiology.     Patient is advised the following: Patient Instructions  This is a "lumpy thyroid," which is usually hereditary. Please have your blood pressure rechecked with your PCP soon.  Let's check a biopsy.  It is easy and practically painless.  We'll contact you with results. It is expected that no cancer will be found.  If no cancer, please come back for a follow-up appointment in 6 months most of the time, a "lumpy thyroid" will eventually become overactive.  this is usually a slow process, happening over the span of many years.

## 2014-08-19 ENCOUNTER — Other Ambulatory Visit (HOSPITAL_COMMUNITY): Payer: Self-pay | Admitting: Interventional Radiology

## 2014-08-19 ENCOUNTER — Inpatient Hospital Stay
Admission: RE | Admit: 2014-08-19 | Discharge: 2014-08-19 | Disposition: A | Discharge status: Home / Self Care | Payer: Self-pay | Source: Ambulatory Visit | Attending: Interventional Radiology | Admitting: Interventional Radiology

## 2014-08-19 DIAGNOSIS — E041 Nontoxic single thyroid nodule: Secondary | ICD-10-CM

## 2014-09-08 ENCOUNTER — Inpatient Hospital Stay: Admission: RE | Admit: 2014-09-08 | Payer: Medicaid Other | Source: Ambulatory Visit

## 2014-09-14 ENCOUNTER — Other Ambulatory Visit (HOSPITAL_COMMUNITY)
Admission: RE | Admit: 2014-09-14 | Discharge: 2014-09-14 | Disposition: A | Discharge status: Home / Self Care | Payer: Medicaid Other | Source: Ambulatory Visit | Attending: Interventional Radiology | Admitting: Interventional Radiology

## 2014-09-14 ENCOUNTER — Ambulatory Visit
Admission: RE | Admit: 2014-09-14 | Discharge: 2014-09-14 | Disposition: A | Discharge status: Home / Self Care | Payer: Medicaid Other | Source: Ambulatory Visit | Attending: Endocrinology | Admitting: Endocrinology

## 2014-09-14 DIAGNOSIS — E041 Nontoxic single thyroid nodule: Secondary | ICD-10-CM

## 2015-01-21 ENCOUNTER — Encounter (HOSPITAL_COMMUNITY): Admission: RE | Payer: Self-pay | Source: Ambulatory Visit

## 2015-01-21 ENCOUNTER — Ambulatory Visit (HOSPITAL_COMMUNITY): Admission: RE | Admit: 2015-01-21 | Payer: Medicaid Other | Source: Ambulatory Visit | Admitting: Neurosurgery

## 2015-01-21 SURGERY — ANTERIOR CERVICAL DECOMPRESSION/DISCECTOMY FUSION 2 LEVELS
Anesthesia: General

## 2015-02-16 ENCOUNTER — Ambulatory Visit: Payer: Medicaid Other | Admitting: Endocrinology

## 2015-04-29 ENCOUNTER — Other Ambulatory Visit: Payer: Self-pay | Admitting: Neurosurgery

## 2015-05-24 ENCOUNTER — Other Ambulatory Visit (HOSPITAL_COMMUNITY): Payer: Self-pay | Admitting: *Deleted

## 2015-05-24 ENCOUNTER — Inpatient Hospital Stay (HOSPITAL_COMMUNITY)
Admission: RE | Admit: 2015-05-24 | Discharge: 2015-05-24 | Disposition: A | Discharge status: Home / Self Care | Payer: Medicaid Other | Source: Ambulatory Visit

## 2015-05-24 NOTE — Pre-Procedure Instructions (Signed)
    YANNELY KINTZEL  05/24/2015      [facility] [id number] [address], [place] - [address] AT [facility] OF [address] [place] [address] Phone: [phone] Fax: [phone]    Your procedure is scheduled on Friday, June 03, 2015 at 8:00 AM.   Report to [facility] Entrance "A" Admitting Office at 6:00 AM.   Call this number if you have problems the morning of surgery: [phone]   Any questions prior to day of surgery, please call [phone] between 8 & 4 PM.    Remember:  Do not eat food or drink liquids after midnight Thursday, 06/02/15.  Take these medicines the morning of surgery with A SIP OF WATER:   Do not wear jewelry, make-up or nail polish.  Do not wear lotions, powders, or perfumes.  You may wear deodorant.  Do not shave 48 hours prior to surgery.    Do not bring valuables to the hospital.  [facility] is not responsible for any belongings or valuables.  Contacts, dentures or bridgework may not be worn into surgery.  Leave your suitcase in the car.  After surgery it may be brought to your room.  For patients admitted to the hospital, discharge time will be determined by your treatment team.  Special instructions:  See "Preparing for Surgery" Instruction sheet.  Please read over the following fact sheets that you were given. Pain Booklet, Coughing and Deep Breathing, MRSA Information and Surgical Site Infection Prevention

## 2015-05-26 ENCOUNTER — Inpatient Hospital Stay (HOSPITAL_COMMUNITY): Admission: RE | Admit: 2015-05-26 | Payer: Medicaid Other | Source: Ambulatory Visit

## 2015-06-27 ENCOUNTER — Ambulatory Visit (HOSPITAL_COMMUNITY): Admission: RE | Admit: 2015-06-27 | Payer: Medicaid Other | Source: Ambulatory Visit | Admitting: Neurosurgery

## 2015-06-27 ENCOUNTER — Encounter (HOSPITAL_COMMUNITY): Admission: RE | Payer: Self-pay | Source: Ambulatory Visit

## 2015-06-27 SURGERY — ANTERIOR CERVICAL DECOMPRESSION/DISCECTOMY FUSION 2 LEVELS
Anesthesia: General

## 2015-12-01 ENCOUNTER — Emergency Department (HOSPITAL_COMMUNITY)
Admission: EM | Admit: 2015-12-01 | Discharge: 2015-12-01 | Disposition: A | Discharge status: Home / Self Care | Payer: Medicaid Other | Attending: Emergency Medicine | Admitting: Emergency Medicine

## 2015-12-01 ENCOUNTER — Encounter (HOSPITAL_COMMUNITY): Payer: Self-pay | Admitting: Emergency Medicine

## 2015-12-01 DIAGNOSIS — K219 Gastro-esophageal reflux disease without esophagitis: Secondary | ICD-10-CM | POA: Insufficient documentation

## 2015-12-01 DIAGNOSIS — E669 Obesity, unspecified: Secondary | ICD-10-CM | POA: Insufficient documentation

## 2015-12-01 DIAGNOSIS — Z79899 Other long term (current) drug therapy: Secondary | ICD-10-CM | POA: Diagnosis not present

## 2015-12-01 DIAGNOSIS — F419 Anxiety disorder, unspecified: Secondary | ICD-10-CM | POA: Diagnosis not present

## 2015-12-01 DIAGNOSIS — M722 Plantar fascial fibromatosis: Secondary | ICD-10-CM | POA: Diagnosis not present

## 2015-12-01 DIAGNOSIS — M109 Gout, unspecified: Secondary | ICD-10-CM | POA: Insufficient documentation

## 2015-12-01 DIAGNOSIS — I10 Essential (primary) hypertension: Secondary | ICD-10-CM | POA: Diagnosis not present

## 2015-12-01 DIAGNOSIS — M79671 Pain in right foot: Secondary | ICD-10-CM | POA: Diagnosis present

## 2015-12-01 MED ORDER — NAPROXEN 500 MG PO TABS
500.0000 mg | ORAL_TABLET | Freq: Once | ORAL | Status: AC
  Administered 2015-12-01: 500 mg via ORAL
  Filled 2015-12-01: qty 1

## 2015-12-01 MED ORDER — NAPROXEN 500 MG PO TABS: 500.0000 mg | ORAL_TABLET | Freq: Two times a day (BID) | ORAL | Status: DC

## 2015-12-01 NOTE — ED Provider Notes (Signed)
CSN: [id number]     Arrival date & time 12/01/15  2148 History  By signing my name below, I, Lydia Gross, attest that this documentation has been prepared under the direction and in the presence of [employer], PA-C. Electronically Signed: Gonzella Gross, Scribe. 12/01/2015. 10:36 PM.   Chief Complaint  Patient presents with  . Foot Pain   The history is provided by the patient. No language interpreter was used.   HPI Comments: Lydia Gross is a 40 y.o. female who presents to the Emergency Department complaining of sudden onset, constant, mild right foot pain which began earlier this afternoon. She also reports associated mild edema to the bottom of her foot earlier, which has since resolved. Pt notes that she had to report to court today and because of that, was walking more than she normally does. She also reports that she wears shoes that do not have much arch support and states that when she puts weight on the balls of her feet while walking, it "feels like she is walking on cotton balls". Pt has not yet taken anything for her pain.   Past Medical History  Diagnosis Date  . Hypertension   . Acid reflux   . Obesity   . Gout   . Anxiety    Past Surgical History  Procedure Laterality Date  . Cholecystectomy    . Cesarean section    . Tubal ligation     Family History  Problem Relation Age of Onset  . Thyroid disease Neg Hx   . Stroke Daughter    Social History  Substance Use Topics  . Smoking status: Never Smoker   . Smokeless tobacco: None  . Alcohol Use: Yes     Comment: little   OB History    No data available      Review of Systems  Musculoskeletal: Positive for myalgias and arthralgias.       Right foot swelling.  All other systems reviewed and are negative.   Allergies  Review of patient's allergies indicates no known allergies.  Home Medications   Prior to Admission medications   Medication Sig Start Date End Date Taking? Authorizing  Provider  albuterol (PROVENTIL HFA;VENTOLIN HFA) 108 (90 BASE) MCG/ACT inhaler Inhale into the lungs every 6 (six) hours as needed for wheezing or shortness of breath.    Historical Provider, MD  diclofenac (VOLTAREN) 75 MG EC tablet Take 75 mg by mouth 2 (two) times daily as needed for mild pain or moderate pain.    Historical Provider, MD  gabapentin (NEURONTIN) 100 MG capsule Take 100 mg by mouth 2 (two) times daily as needed (pain).    Historical Provider, MD  hydrochlorothiazide (HYDRODIURIL) 25 MG tablet Take 25 mg by mouth every morning.     Historical Provider, MD  IRON PO Take 1 tablet by mouth every morning.     Historical Provider, MD  lisinopril (PRINIVIL,ZESTRIL) 40 MG tablet Take 80 mg by mouth 2 (two) times daily.     Historical Provider, MD  Multiple Vitamins-Minerals (HAIR/SKIN/NAILS PO) Take 1 tablet by mouth every morning.     Historical Provider, MD  naproxen (NAPROSYN) 500 MG tablet Take 1 tablet (500 mg total) by mouth 2 (two) times daily. 12/01/15   Antony Madura, PA-C  Olopatadine HCl (PATADAY) 0.2 % SOLN Place 1 drop into both eyes every evening.    Historical Provider, MD  ranitidine (ZANTAC) 150 MG tablet Take 150 mg by mouth 2 (two) times  daily.     Historical Provider, MD  sertraline (ZOLOFT) 100 MG tablet Take 100 mg by mouth. 08/06/14 08/06/15  Historical Provider, MD  tiZANidine (ZANAFLEX) 4 MG tablet Take 2-4 mg by mouth 2 (two) times daily as needed for muscle spasms.     Historical Provider, MD  traMADol (ULTRAM) 50 MG tablet Take 50 mg by mouth every 6 (six) hours as needed for moderate pain.    Historical Provider, MD   BP 132/71 mmHg  Pulse 77  Temp(Src) 98 F (36.7 C) (Oral)  Resp 20  Ht  (1.753 m)  Wt 165.563 kg  BMI 53.88 kg/m2  SpO2 99%   Physical Exam  Constitutional: She is oriented to person, place, and time. She appears well-developed and well-nourished. No distress.  Nontoxic, obese female  HENT:  Head: Normocephalic and atraumatic.   Eyes: Conjunctivae and EOM are normal. No scleral icterus.  Neck: Normal range of motion.  Cardiovascular: Normal rate, regular rhythm and intact distal pulses.   DP and PT pulses 2+ in the RLE  Pulmonary/Chest: Effort normal. No respiratory distress.  Respirations even and unlabored  Musculoskeletal: Normal range of motion.       Right ankle: Normal.       Right foot: There is tenderness. There is normal range of motion, no bony tenderness, no swelling, no crepitus and no deformity.       Feet:  No bony deformity or crepitus to the RLE. No swelling. No pitting edema in the BLE.  Neurological: She is alert and oriented to person, place, and time. She exhibits normal muscle tone. Coordination normal.  Sensation to light touch intact in the RLE. Patient able to wiggle all toes.  Skin: Skin is warm and dry. No rash noted. She is not diaphoretic. No erythema. No pallor.  No erythema or heat to touch to the RLE. No ulcers.  Psychiatric: She has a normal mood and affect. Her behavior is normal.  Nursing note and vitals reviewed.   ED Course  Procedures  DIAGNOSTIC STUDIES:    Oxygen Saturation is 99% on RA, normal by my interpretation.   COORDINATION OF CARE:  10:37 PM Advise pt to ice, take pain medication, and wear shoes with good support. Will administer pt ibuprofen or aleve in the ED. Discussed treatment plan with pt at bedside and pt agreed to plan.   MDM   Final diagnoses:  Plantar fasciitis of right foot    40 year old female presents to the emergency department for evaluation of atraumatic right foot pain after standing on her feet for long periods of time while at Court today. No evidence of secondary infection or cellulitis. No ulcers present. No significant swelling appreciated. No pitting edema in the bilateral lower extremities. Patient is neurovascularly intact. Suspect plantar fasciitis. Will manage with icing and NSAIDs and referred to PCP for follow-up. Return  precautions given at discharge. Patient agreeable to plan with no unaddressed concerns. Discharged in good condition.  I personally performed the services described in this documentation, which was scribed in my presence. The recorded information has been reviewed and is accurate.    Filed Vitals:   12/01/15 2207  BP: 132/71  Pulse: 77  Temp: 98 F (36.7 C)  TempSrc: Oral  Resp: 20  Height:  (1.753 m)  Weight: 165.563 kg  SpO2: 99%      Antony Madura, PA-C 12/01/15 2249  Derwood Kaplan, MD 12/02/15 0011

## 2015-12-01 NOTE — ED Notes (Signed)
Patient is having pain in foot. Patient states that it is tight.

## 2015-12-01 NOTE — Discharge Instructions (Signed)
Plantar Fasciitis Plantar fasciitis is a painful foot condition that affects the heel. It occurs when the band of tissue that connects the toes to the heel bone (plantar fascia) becomes irritated. This can happen after exercising too much or doing other repetitive activities (overuse injury). The pain from plantar fasciitis can range from mild irritation to severe pain that makes it difficult for you to walk or move. The pain is usually worse in the morning or after you have been sitting or lying down for a while. CAUSES This condition may be caused by:  Standing for long periods of time.  Wearing shoes that do not fit.  Doing high-impact activities, including running, aerobics, and ballet.  Being overweight.  Having an abnormal way of walking (gait).  Having tight calf muscles.  Having high arches in your feet.  Starting a new athletic activity. SYMPTOMS The main symptom of this condition is heel pain. Other symptoms include:  Pain that gets worse after activity or exercise.  Pain that is worse in the morning or after resting.  Pain that goes away after you walk for a few minutes. DIAGNOSIS This condition may be diagnosed based on your signs and symptoms. Your health care provider will also do a physical exam to check for:  A tender area on the bottom of your foot.  A high arch in your foot.  Pain when you move your foot.  Difficulty moving your foot. You may also need to have imaging studies to confirm the diagnosis. These can include:  X-rays.  Ultrasound.  MRI. TREATMENT  Treatment for plantar fasciitis depends on the severity of the condition. Your treatment may include:  Rest, ice, and over-the-counter pain medicines to manage your pain.  Exercises to stretch your calves and your plantar fascia.  A splint that holds your foot in a stretched, upward position while you sleep (night splint).  Physical therapy to relieve symptoms and prevent problems in the  future.  Cortisone injections to relieve severe pain.  Extracorporeal shock wave therapy (ESWT) to stimulate damaged plantar fascia with electrical impulses. It is often used as a last resort before surgery.  Surgery, if other treatments have not worked after 12 months. HOME CARE INSTRUCTIONS  Take medicines only as directed by your health care provider.  Avoid activities that cause pain.  Roll the bottom of your foot over a bag of ice or a bottle of cold water. Do this for 20 minutes, 3-4 times a day.  Perform simple stretches as directed by your health care provider.  Try wearing athletic shoes with air-sole or gel-sole cushions or soft shoe inserts.  Wear a night splint while sleeping, if directed by your health care provider.  Keep all follow-up appointments with your health care provider. PREVENTION   Do not perform exercises or activities that cause heel pain.  Consider finding low-impact activities if you continue to have problems.  Lose weight if you need to. The best way to prevent plantar fasciitis is to avoid the activities that aggravate your plantar fascia. SEEK MEDICAL CARE IF:  Your symptoms do not go away after treatment with home care measures.  Your pain gets worse.  Your pain affects your ability to move or do your daily activities.   This information is not intended to replace advice given to you by your health care provider. Make sure you discuss any questions you have with your health care provider.   Document Released: 06/26/2001 Document Revised: 06/22/2015 Document Reviewed: 08/11/2014 Elsevier   Interactive Patient Education 2016 Elsevier Inc.  

## 2016-03-20 ENCOUNTER — Other Ambulatory Visit: Payer: Self-pay | Admitting: Anesthesiology

## 2016-03-20 DIAGNOSIS — M4712 Other spondylosis with myelopathy, cervical region: Secondary | ICD-10-CM

## 2016-04-03 ENCOUNTER — Other Ambulatory Visit: Payer: Medicaid Other

## 2016-04-11 ENCOUNTER — Ambulatory Visit
Admission: RE | Admit: 2016-04-11 | Discharge: 2016-04-11 | Disposition: A | Discharge status: Home / Self Care | Payer: Medicaid Other | Source: Ambulatory Visit | Attending: Anesthesiology | Admitting: Anesthesiology

## 2016-04-11 DIAGNOSIS — M4712 Other spondylosis with myelopathy, cervical region: Secondary | ICD-10-CM

## 2016-08-07 ENCOUNTER — Encounter (HOSPITAL_COMMUNITY): Payer: Self-pay | Admitting: Emergency Medicine

## 2016-08-07 ENCOUNTER — Emergency Department (HOSPITAL_COMMUNITY)
Admission: EM | Admit: 2016-08-07 | Discharge: 2016-08-07 | Disposition: A | Discharge status: Home / Self Care | Payer: Medicaid Other | Attending: Emergency Medicine | Admitting: Emergency Medicine

## 2016-08-07 DIAGNOSIS — Z7951 Long term (current) use of inhaled steroids: Secondary | ICD-10-CM | POA: Insufficient documentation

## 2016-08-07 DIAGNOSIS — I10 Essential (primary) hypertension: Secondary | ICD-10-CM | POA: Diagnosis not present

## 2016-08-07 DIAGNOSIS — M5412 Radiculopathy, cervical region: Secondary | ICD-10-CM | POA: Diagnosis not present

## 2016-08-07 DIAGNOSIS — M542 Cervicalgia: Secondary | ICD-10-CM | POA: Diagnosis present

## 2016-08-07 DIAGNOSIS — Z79899 Other long term (current) drug therapy: Secondary | ICD-10-CM | POA: Diagnosis not present

## 2016-08-07 HISTORY — DX: Other chronic pain: G89.29

## 2016-08-07 HISTORY — DX: Cervicalgia: M54.2

## 2016-08-07 MED ORDER — METHYLPREDNISOLONE 4 MG PO TBPK: ORAL_TABLET | ORAL | 0 refills | Status: DC

## 2016-08-07 MED ORDER — KETOROLAC TROMETHAMINE 60 MG/2ML IM SOLN
60.0000 mg | Freq: Once | INTRAMUSCULAR | Status: AC
  Administered 2016-08-07: 60 mg via INTRAMUSCULAR
  Filled 2016-08-07: qty 2

## 2016-08-07 NOTE — ED Triage Notes (Signed)
Per pt, states she sees a neurologist for herniated disc in neck-states she missed appointment with pain management so they dropped her-in process of getting new pain  Management MD-states unable to have surgery due to need of finding a place to live-has been out of meds for 2 weeks

## 2016-08-07 NOTE — ED Provider Notes (Signed)
[facility]-EMERGENCY DEPT Provider Note   CSN: [id number] Arrival date & time: 08/07/16  1011     History   Chief Complaint Chief Complaint  Patient presents with  . Neck Pain    HPI Lydia Gross is a 40 y.o. female.  The history is provided by the patient.  Back Pain   This is a chronic problem. Episode onset: multiple years. The problem occurs constantly. The problem has not changed since onset.The pain is associated with no known injury. Pain location: neck. The quality of the pain is described as aching and shooting. Radiates to: bilateral shoulders. The pain is moderate. The symptoms are aggravated by certain positions. The pain is worse during the night. Pertinent negatives include no fever, no weight loss, no bowel incontinence, no bladder incontinence, no paresis and no weakness. Risk factors include obesity.    Past Medical History:  Diagnosis Date  . Acid reflux   . Anxiety   . Chronic neck pain   . Gout   . Hypertension   . Obesity     Patient Active Problem List   Diagnosis Date Noted  . Thyroid nodule 08/18/2014  . 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 Cervical pain 01/12/2014  . Edema leg 05/15/2011  . Avitaminosis D 05/02/2011  . Alopecia 05/01/2011  . LBP (low back pain) 12/01/2010  . Extreme obesity (HCC) 12/01/2010  . Malaise and fatigue 12/01/2010  . Essential (primary) hypertension 12/01/2010  . Cannot sleep 09/20/2010  . Anemia, due to inadequate iron intake 09/20/2010  . Esophagitis, reflux 07/21/2010  . Absolute anemia 06/21/2010  . Adjustment disorder with depressed mood 06/15/2010  . Allergic rhinitis 04/16/2008    Past Surgical History:  Procedure Laterality Date  . CESAREAN SECTION    . CHOLECYSTECTOMY    . TUBAL LIGATION      OB History    No data available       Home Medications    Prior to Admission medications   Medication Sig Start Date End Date Taking? Authorizing Provider  albuterol (PROVENTIL HFA;VENTOLIN  HFA) 108 (90 BASE) MCG/ACT inhaler Inhale into the lungs every 6 (six) hours as needed for wheezing or shortness of breath.    Historical Provider, MD  diclofenac (VOLTAREN) 75 MG EC tablet Take 75 mg by mouth 2 (two) times daily as needed for mild pain or moderate pain.    Historical Provider, MD  gabapentin (NEURONTIN) 100 MG capsule Take 100 mg by mouth 2 (two) times daily as needed (pain).    Historical Provider, MD  hydrochlorothiazide (HYDRODIURIL) 25 MG tablet Take 25 mg by mouth every morning.     Historical Provider, MD  IRON PO Take 1 tablet by mouth every morning.     Historical Provider, MD  lisinopril (PRINIVIL,ZESTRIL) 40 MG tablet Take 80 mg by mouth 2 (two) times daily.     Historical Provider, MD  Multiple Vitamins-Minerals (HAIR/SKIN/NAILS PO) Take 1 tablet by mouth every morning.     Historical Provider, MD  naproxen (NAPROSYN) 500 MG tablet Take 1 tablet (500 mg total) by mouth 2 (two) times daily. 12/01/15   Antony MaduraKelly Humes, PA-C  Olopatadine HCl (PATADAY) 0.2 % SOLN Place 1 drop into both eyes every evening.    Historical Provider, MD  ranitidine (ZANTAC) 150 MG tablet Take 150 mg by mouth 2 (two) times daily.     Historical Provider, MD  sertraline (ZOLOFT) 100 MG tablet Take 100 mg by mouth. 08/06/14 08/06/15  Historical Provider, MD  tiZANidine (ZANAFLEX) 4 MG tablet Take 2-4 mg by mouth 2 (two) times daily as needed for muscle spasms.     Historical Provider, MD  traMADol (ULTRAM) 50 MG tablet Take 50 mg by mouth every 6 (six) hours as needed for moderate pain.    Historical Provider, MD    Family History Family History  Problem Relation Age of Onset  . Stroke Daughter   . Thyroid disease Neg Hx     Social History Social History  Substance Use Topics  . Smoking status: Never Smoker  . Smokeless tobacco: Not on file  . Alcohol use Yes     Comment: little     Allergies   Review of patient's allergies indicates no known allergies.   Review of Systems Review of  Systems  Constitutional: Negative for fever and weight loss.  Gastrointestinal: Negative for bowel incontinence.  Genitourinary: Negative for bladder incontinence.  Musculoskeletal: Positive for back pain.  Neurological: Negative for weakness.  All other systems reviewed and are negative.    Physical Exam Updated Vital Signs BP (!) 158/109 (BP Location: Left Wrist)   Pulse (!) 52   Temp 98.4 F (36.9 C) (Oral)   Resp 16   LMP 07/08/2016   SpO2 100%   Physical Exam  Constitutional: She is oriented to person, place, and time. She appears well-developed and well-nourished. No distress.  HENT:  Head: Normocephalic.  Nose: Nose normal.  Eyes: Conjunctivae are normal.  Neck: Neck supple. No tracheal deviation present.  Cardiovascular: Normal rate and regular rhythm.   Pulmonary/Chest: Effort normal. No respiratory distress.  Abdominal: Soft. She exhibits no distension.  Musculoskeletal:       Cervical back: She exhibits pain. She exhibits normal range of motion, no tenderness, no bony tenderness, no deformity and no spasm.  Neurological: She is alert and oriented to person, place, and time.  Skin: Skin is warm and dry.  Psychiatric: She has a normal mood and affect.     ED Treatments / Results  Labs (all labs ordered are listed, but only abnormal results are displayed) Labs Reviewed - No data to display  EKG  EKG Interpretation None       Radiology No results found.  Procedures Procedures (including critical care time)  Medications Ordered in ED Medications  ketorolac (TORADOL) injection 60 mg (not administered)     Initial Impression / Assessment and Plan / ED Course  I have reviewed the triage vital signs and the nursing notes.  Pertinent labs & imaging results that were available during my care of the patient were reviewed by me and considered in my medical decision making (see chart for details).  Clinical Course    40 y.o. female presents with  chronic neck pain. Has lost follow up with pain management, pending establishment visit. Explained inability to provide narcotics for chronic pain condition. States she has responded to steroids previously so will provide that as well as IM toradol. Offered trigger point injection but declined. Plan to follow up with PCP as needed and return precautions discussed for worsening or new concerning symptoms.   Final Clinical Impressions(s) / ED Diagnoses   Final diagnoses:  Cervical radiculopathy    New Prescriptions Discharge Medication List as of 08/07/2016 10:59 AM    START taking these medications   Details  methylPREDNISolone (MEDROL DOSEPAK) 4 MG TBPK tablet Follow package instructions, Print         Lyndal Pulley, MD 08/07/16 2000

## 2017-01-15 IMAGING — MR MR CERVICAL SPINE W/O CM
5 series · 31 of 48 positions shown · non-contrast
Comparison: MRI 01/26/2014.  CT 05/12/2012.

CLINICAL DATA: Neck pain and bilateral arm pain with numbness in
the hands. Some weakness developing in the hands presently.

EXAM:
MRI CERVICAL SPINE WITHOUT CONTRAST
TECHNIQUE: Multiplanar, multisequence MR imaging of the cervical spine was
performed. No intravenous contrast was administered.

[Series 6: T1 · sagittal · 3.0mm · 0.66mm/px · 7 of 15 slices shown]
[im 1/15]
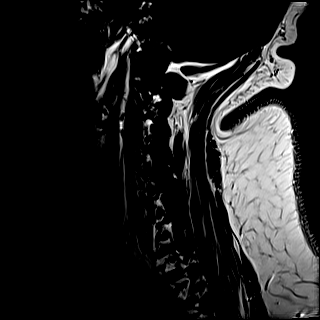
[im 3/15]
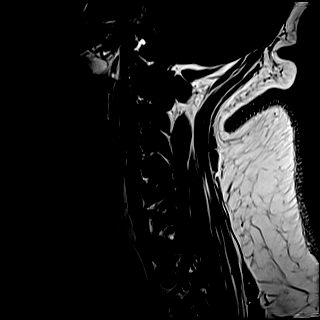
[im 5/15]
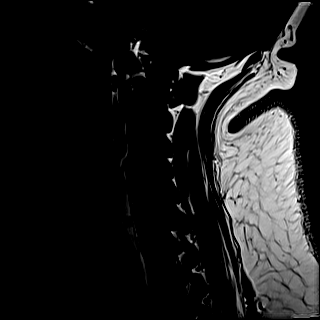
[im 8/15]
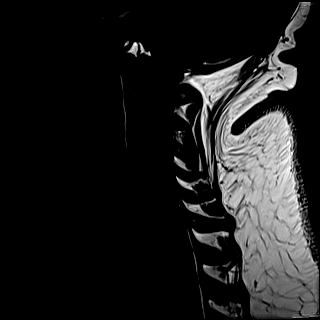
[im 10/15]
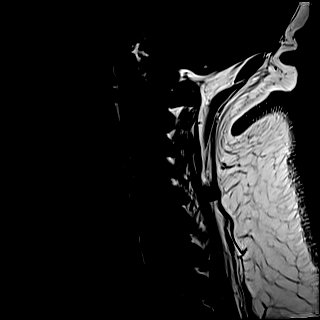
[im 12/15]
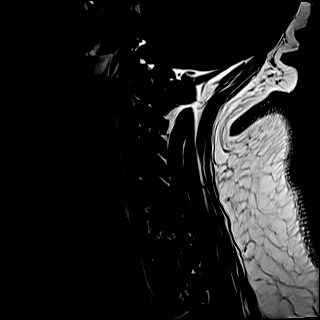
[im 15/15]
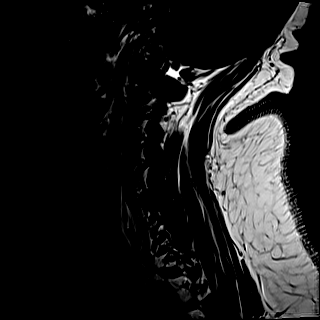

[Series 7: T2 · sagittal · 3.0mm · 0.55mm/px · 7 of 15 slices shown (1 of 2)]
[im 1/15]
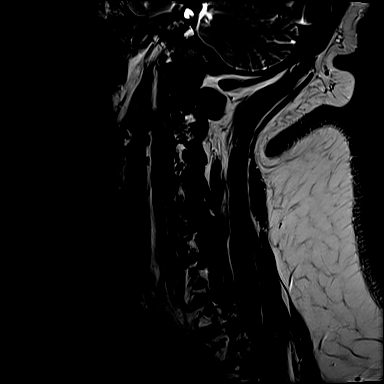
[im 3/15]
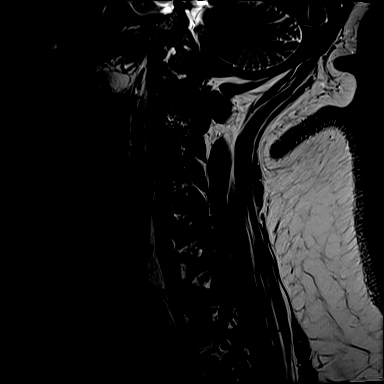
[im 5/15]
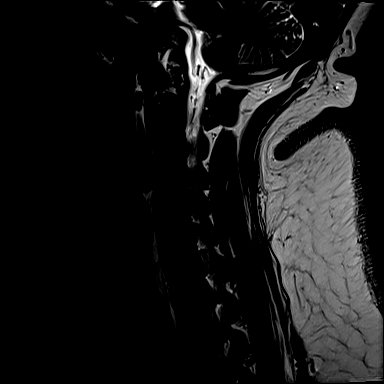
[im 8/15]
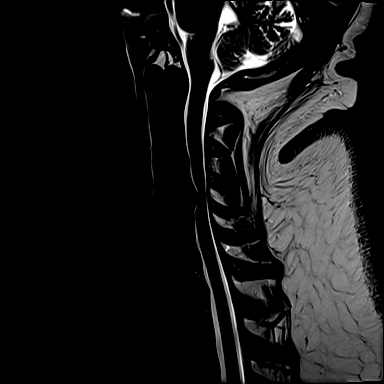
[im 10/15]
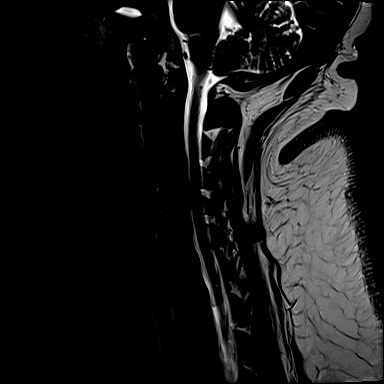
[im 12/15]
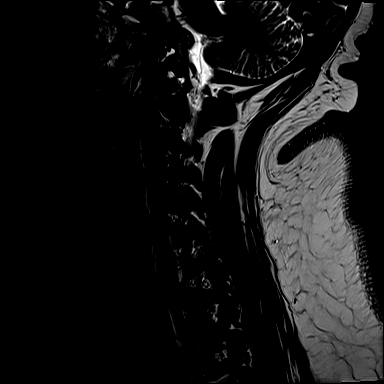
[im 15/15]
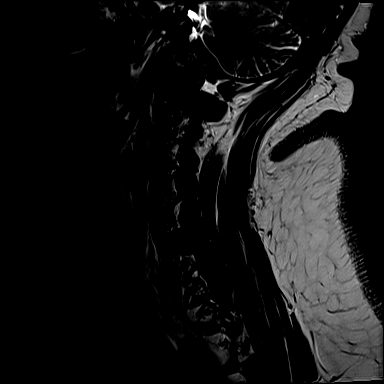

[Series 8: STIR · sagittal · 3.0mm · 0.33mm/px · 6 of 15 slices shown]
[im 1/15]
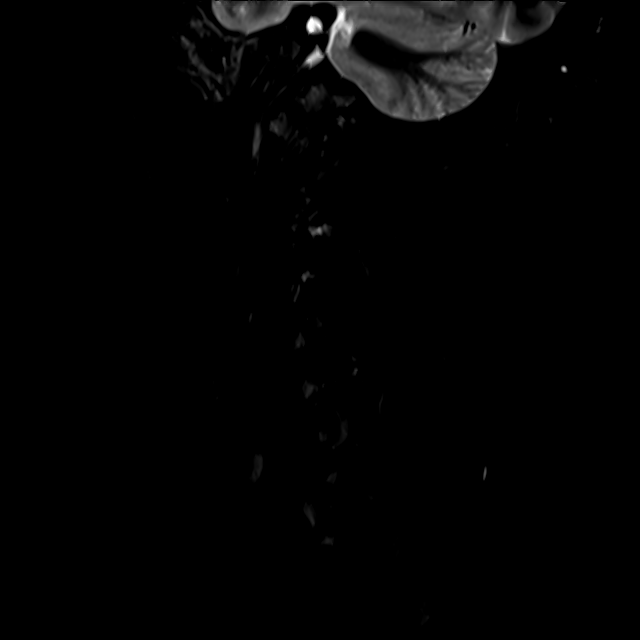
[im 3/15]
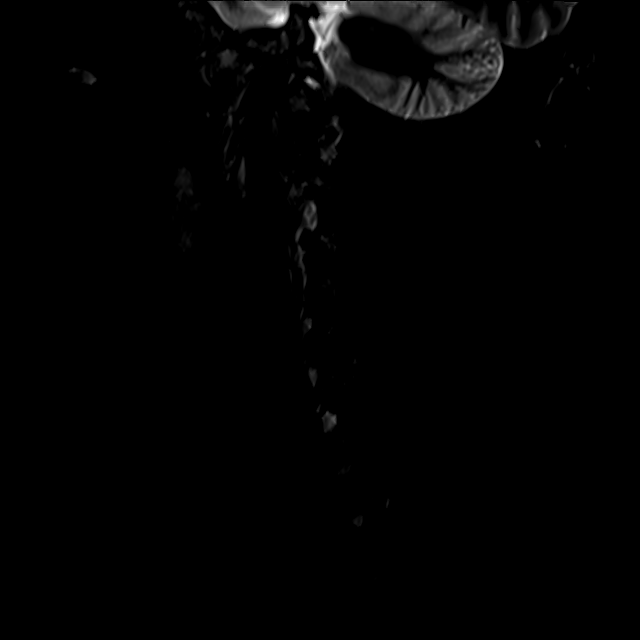
[im 6/15]
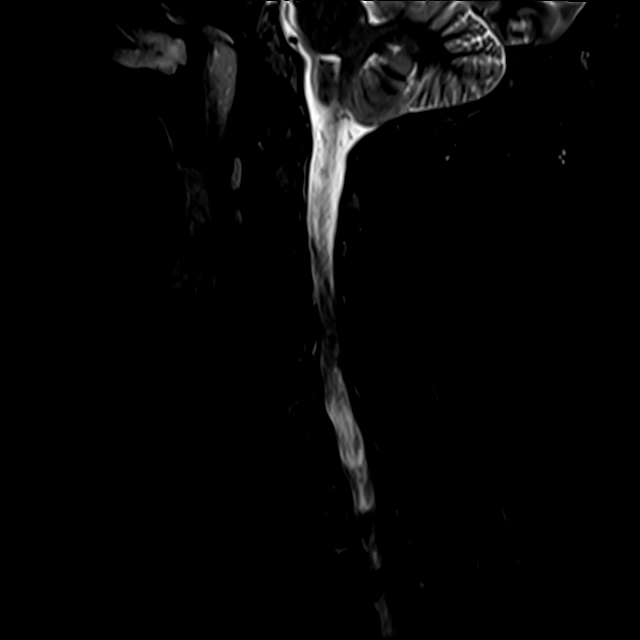
[im 9/15]
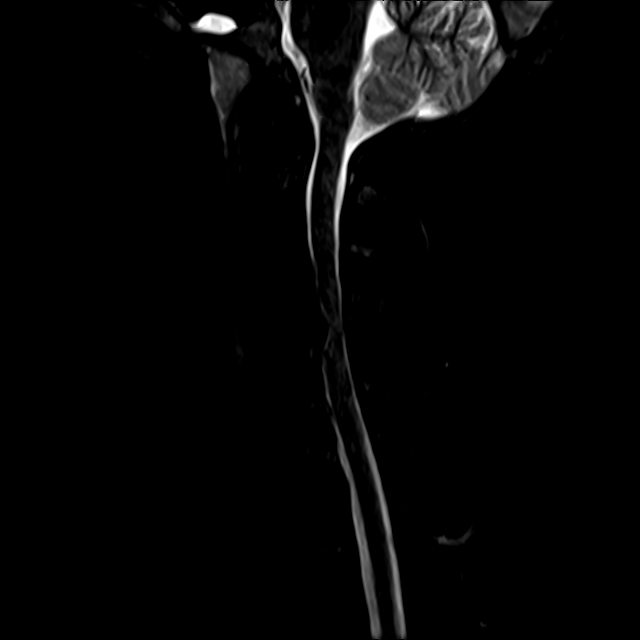
[im 12/15]
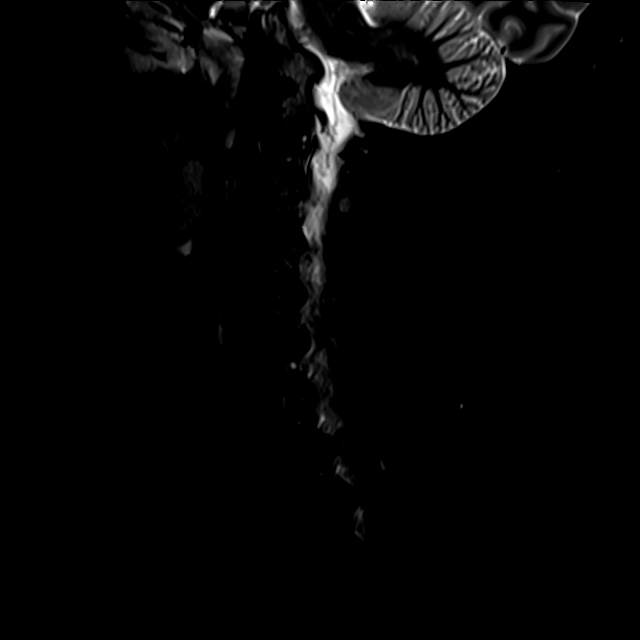
[im 15/15]
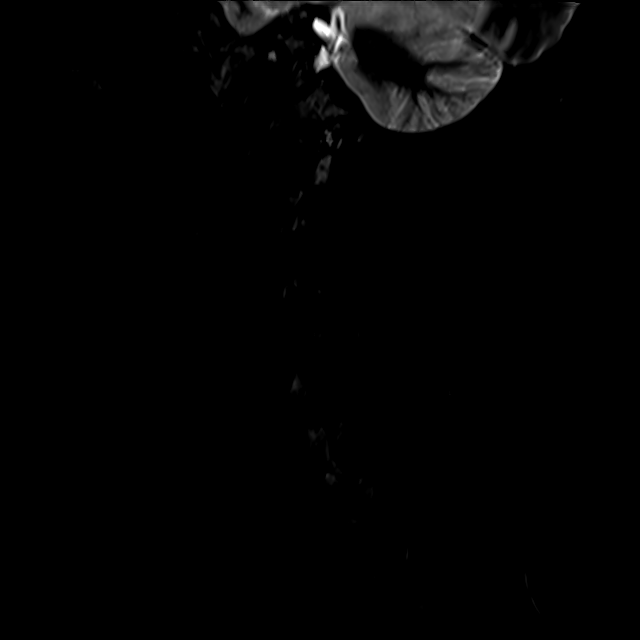

[Series 10: GRE · axial · 3.0mm · 0.47mm/px · z∈[-79,-50]mm · 3 of 33 slices shown]
[im 1/33]
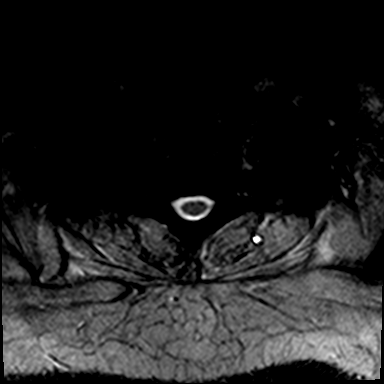
[im 5/33]
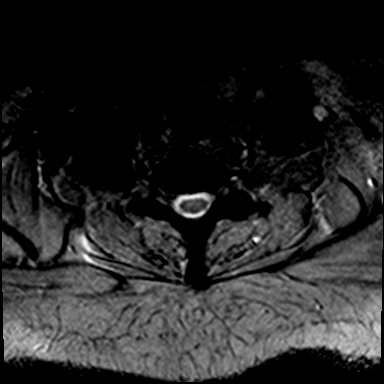
[im 10/33]
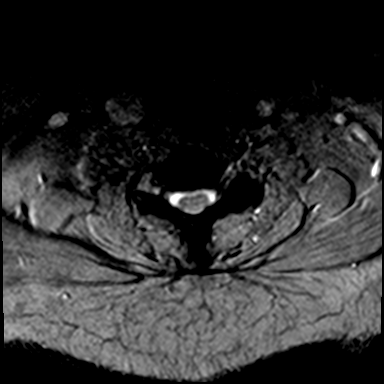

[Series 11: T2 · axial · 3.0mm · 0.94mm/px · z∈[-79,+25]mm · 8 of 33 slices shown (2 of 2)]
[im 1/33]
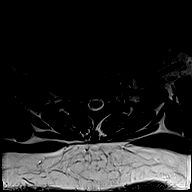
[im 5/33]
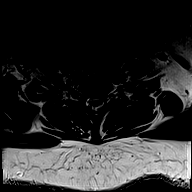
[im 10/33]
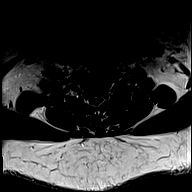
[im 15/33]
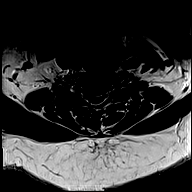
[im 18/33]
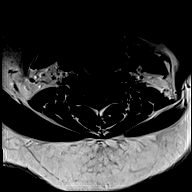
[im 23/33]
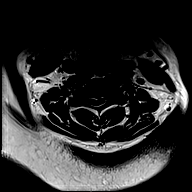
[im 28/33]
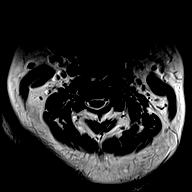
[im 33/33]
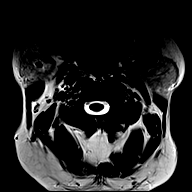

[31 of 48 positions shown; findings below may reference images not displayed]

FINDINGS: Alignment: Normal

Vertebrae: No fracture or focal bone lesion.

Cord: No primary cord lesion. See below for cord deformity at the
L4-5 level.

Posterior Fossa, vertebral arteries, paraspinal tissues:
Unremarkable

Disc levels:

No significant finding at the foramen magnum. Mild hypertrophic
changes at C1-2 articulation but no encroachment upon the neural
spaces.

C2-3:  Normal interspace.

C3-4: Shallow disc protrusion indents the ventral subarachnoid space
but does not compress the cord or show foraminal extension. No facet
arthropathy.

C4-5: Broad-based disc herniation with slight caudal down turning.
Spinal stenosis with effacement of the subarachnoid space and
flattening of the cord. AP diameter of the canal only 5 mm. No
foraminal extension. Question early abnormal T2 signal in the cord
at this level.

C5-6: Mild bulging of the disc. Facet degeneration right more than
left. No stenosis.

C6-7: Facet degeneration on the left with 1 mm of anterolisthesis.
Minimal bulging of the disc. No canal or foraminal stenosis.

C7-T1: Mild facet degeneration on the left. No canal or foraminal
stenosis.

Compared to the study of 7264, the disc herniation at C4-5 is larger
and the stenosis is more severe. Consideration of surgical
decompression suggested to potentially minimize the chance of
myelopathy.
IMPRESSION: Enlargement of a broad-based disc herniation at C4-5 with slight
caudal down turning. Spinal stenosis with AP diameter only 5 mm.
Flattening and deformity the cord with probable early T2 signal in
the cord.

Shallow disc protrusion at C3-4 but without neural compression.

## 2019-12-24 ENCOUNTER — Ambulatory Visit: Payer: Medicaid Other | Attending: Internal Medicine

## 2019-12-24 DIAGNOSIS — Z23 Encounter for immunization: Secondary | ICD-10-CM

## 2019-12-24 NOTE — Progress Notes (Signed)
   Covid-19 Vaccination Clinic  Name:  Lydia Gross    MRN: [id number] DOB: 1976-03-15  12/24/2019  [name] was observed post Covid-19 immunization for 15 minutes without incident. She was provided with Vaccine Information Sheet and instruction to access the V-Safe system.   [name] was instructed to call 911 with any severe reactions post vaccine: Marland Kitchen Difficulty breathing  . Swelling of face and throat  . A fast heartbeat  . A bad rash all over body  . Dizziness and weakness   Immunizations Administered    Name Date Dose VIS Date Route   Pfizer COVID-19 Vaccine 12/24/2019 11:37 AM 0.3 mL 09/25/2019 Intramuscular   Manufacturer: [employer], [employer]   Lot: [id number]   NDC: [address]-3

## 2020-01-18 ENCOUNTER — Ambulatory Visit: Payer: Medicaid Other | Attending: Internal Medicine

## 2020-01-18 DIAGNOSIS — Z23 Encounter for immunization: Secondary | ICD-10-CM

## 2020-01-18 NOTE — Progress Notes (Signed)
   Covid-19 Vaccination Clinic  Name:  Lydia Gross    MRN: [id number] DOB: May 20, 1976  01/18/2020  Lydia Gross was observed post Covid-19 immunization for 15 minutes without incident. She was provided with Vaccine Information Sheet and instruction to access the V-Safe system.   Lydia Gross was instructed to call 911 with any severe reactions post vaccine: Marland Kitchen Difficulty breathing  . Swelling of face and throat  . A fast heartbeat  . A bad rash all over body  . Dizziness and weakness   Immunizations Administered    Name Date Dose VIS Date Route   Pfizer COVID-19 Vaccine 01/18/2020 12:58 PM 0.3 mL 09/25/2019 Intramuscular   Manufacturer: [employer], [employer]   Lot: [id number]   NDC: [address]-1

## 2021-06-07 ENCOUNTER — Other Ambulatory Visit: Payer: Self-pay | Admitting: Otolaryngology

## 2021-06-07 DIAGNOSIS — K1379 Other lesions of oral mucosa: Secondary | ICD-10-CM

## 2022-01-29 ENCOUNTER — Encounter: Payer: Self-pay | Admitting: *Deleted

## 2022-04-04 DIAGNOSIS — R0683 Snoring: Secondary | ICD-10-CM | POA: Insufficient documentation

## 2022-04-05 ENCOUNTER — Encounter: Payer: Medicaid Other | Admitting: Family Medicine

## 2022-04-19 ENCOUNTER — Other Ambulatory Visit: Payer: Self-pay

## 2022-04-19 ENCOUNTER — Other Ambulatory Visit (HOSPITAL_COMMUNITY)
Admission: RE | Admit: 2022-04-19 | Discharge: 2022-04-19 | Disposition: A | Discharge status: Home / Self Care | Payer: Medicaid Other | Source: Ambulatory Visit | Attending: Family Medicine | Admitting: Family Medicine

## 2022-04-19 ENCOUNTER — Ambulatory Visit (INDEPENDENT_AMBULATORY_CARE_PROVIDER_SITE_OTHER): Payer: Medicaid Other | Admitting: Family Medicine

## 2022-04-19 ENCOUNTER — Encounter: Payer: Self-pay | Admitting: Family Medicine

## 2022-04-19 VITALS — BP 119/64 | HR 76 | Wt 336.0 lb

## 2022-04-19 DIAGNOSIS — N760 Acute vaginitis: Secondary | ICD-10-CM | POA: Insufficient documentation

## 2022-04-19 DIAGNOSIS — N939 Abnormal uterine and vaginal bleeding, unspecified: Secondary | ICD-10-CM | POA: Insufficient documentation

## 2022-04-19 DIAGNOSIS — Z124 Encounter for screening for malignant neoplasm of cervix: Secondary | ICD-10-CM | POA: Insufficient documentation

## 2022-04-19 DIAGNOSIS — Z01419 Encounter for gynecological examination (general) (routine) without abnormal findings: Secondary | ICD-10-CM | POA: Diagnosis not present

## 2022-04-19 MED ORDER — NAPROXEN 500 MG PO TABS: 500.0000 mg | ORAL_TABLET | Freq: Two times a day (BID) | ORAL | 0 refills | Status: AC

## 2022-04-19 NOTE — Progress Notes (Signed)
GYNECOLOGY OFFICE VISIT NOTE  History:  46 y.o. [id number] . here today for establishing care and heavy bleeding. She denies any abnormal vaginal discharge, bleeding, pelvic pain or other concerns.   #Heavy periods Has had symptoms for years but does not know exactly how long Periods last 5-7 days Using ~8-10 pads or tampons the first few days On heavy nights having to chaning 4-5 times a nights Has some cramps but not bad Is anemic - taking PNV and iron No symptoms of anemia, last HgB in 03/2021 (by PCP 10.9) Not sure if has [facility] of fibroids Has not been previously diagnosed with polyps or fibroids  #Cervical cancer screening Not sure when last PAP was Initially hesitant to get repeat PAP today as on period We discussed we can still do PAP today    The following portions of the patient's history were reviewed and updated as appropriate: allergies, current medications, past family history, past medical history, past social history, past surgical history and problem list.     Review of Systems:  Pertinent items noted in HPI Review of Systems  All other systems reviewed and are negative.   Objective:  Physical Exam BP 119/64   Pulse 76   Wt (!) 336 lb (152.4 kg)   LMP 04/15/2022   BMI 49.62 kg/m  Physical Exam Vitals and nursing note reviewed. Exam conducted with a chaperone present.  HENT:     Head: Normocephalic.     Mouth/Throat:     Mouth: Mucous membranes are moist.  Eyes:     Extraocular Movements: Extraocular movements intact.  Cardiovascular:     Rate and Rhythm: Normal rate.     Pulses: Normal pulses.  Pulmonary:     Effort: Pulmonary effort is normal.  Abdominal:     General: Abdomen is flat. There is no distension.     Tenderness: There is no abdominal tenderness.  Genitourinary:    General: Normal vulva.     Comments: Speculum exam done and cervix visualized without difficulty. Small amount of blood at Os. Collected PAP with brush Neurological:      Mental Status: She is alert.     Labs and Imaging Results for orders placed or performed in visit on 04/19/22 (from the past 168 hour(s))  Cervicovaginal ancillary only( [place])   Collection Time: 04/19/22  3:09 PM  Result Value Ref Range   Trichomonas Negative    Bacterial Vaginitis (gardnerella) Positive (A)    Candida Vaginitis Negative    Candida Glabrata Negative    Comment      Normal Reference Range Bacterial Vaginosis - Negative   Comment Normal Reference Range Candida Species - Negative    Comment Normal Reference Range Candida Galbrata - Negative    Comment Normal Reference Range Trichomonas - Negative    No results found.  Assessment & Plan:  1. Abnormal uterine bleeding - Discussed differential: including possible structural cause (polyps or fibroids)vs endometriosis vs other etiology - will get pelvic US to evaluate possible structural etiology - Discussed only way to tell definitively regarding endometriosis is either by US showing an endometrioma or by laparoscopy. Would advise against surgery unless other treatment options have failed or concerns regarding infertility. We removed the impact on [facility] on personal risk of endometriosis.  - Discussed treatment options: NSAIDs prn severe cramps along with tylenol, OCPs (which we discussed not recommended at her age and with other risk factors for clots), Depo, Nexplanon, Lng-IUD - patient would like to try  NSAIDs and wait till [place] to decide if she would like other form of hormonal birth control - discussed hormonal IUD may be a good option as it could potentially stay in until menopause  2. Cervical cancer screening Patient unsure how long ago she got but due now.  - PAP collected  3. STI screening STI screening swabs collected during PAP and sent. Goes to PCP for other primary care/screening needs  Routine preventative health maintenance measures emphasized. Please refer to After Visit Summary for other counseling  recommendations.   F/up in 6-8 weeks   Total face-to-face time with patient: 20 minutes.  Over 50% of encounter was spent on counseling and coordination of care.  Warner Mccreedy, MD, MPH OB Fellow, Faculty [facility] for [facility], [facility]

## 2022-04-19 NOTE — Progress Notes (Signed)
Pelvic US scheduled for at 05/01/22 at 10 AM. Patient notified.

## 2022-04-20 LAB — CERVICOVAGINAL ANCILLARY ONLY
Bacterial Vaginitis (gardnerella): POSITIVE — AB
Candida Glabrata: NEGATIVE
Candida Vaginitis: NEGATIVE
Comment: NEGATIVE
Comment: NEGATIVE
Comment: NEGATIVE
Comment: NEGATIVE
Trichomonas: NEGATIVE

## 2022-04-23 ENCOUNTER — Telehealth: Payer: Self-pay | Admitting: *Deleted

## 2022-04-23 DIAGNOSIS — N760 Acute vaginitis: Secondary | ICD-10-CM

## 2022-04-23 LAB — CYTOLOGY - PAP
Comment: NEGATIVE
Diagnosis: NEGATIVE
Diagnosis: REACTIVE
High risk HPV: POSITIVE — AB

## 2022-04-23 MED ORDER — METRONIDAZOLE 500 MG PO TABS: 500.0000 mg | ORAL_TABLET | Freq: Two times a day (BID) | ORAL | 0 refills | Status: AC

## 2022-04-23 NOTE — Telephone Encounter (Signed)
-----   Message from Warner Mccreedy, MD sent at 04/20/2022 12:35 PM EDT ----- Hi Could you treat this patient's BV per protocol?  Thanks! Dr. Ephriam Jenkins ----- Message ----- From: Interface, Lab In [facility] [place] [facility] Sent: 04/20/2022  12:23 PM EDT To: Warner Mccreedy, MD

## 2022-04-23 NOTE — Telephone Encounter (Signed)
I called Lydia Gross and notified her of results and rx . RX sent in per protocol for Flagyl  500mg  po BID for 7 days. Reviewed instructions with patient. She voices understanding. 

## 2022-05-01 ENCOUNTER — Ambulatory Visit
Admission: RE | Admit: 2022-05-01 | Discharge: 2022-05-01 | Disposition: A | Discharge status: Home / Self Care | Payer: Medicaid Other | Source: Ambulatory Visit | Attending: Family Medicine | Admitting: Family Medicine

## 2022-05-01 DIAGNOSIS — N939 Abnormal uterine and vaginal bleeding, unspecified: Secondary | ICD-10-CM | POA: Diagnosis present

## 2022-05-11 ENCOUNTER — Telehealth: Payer: Self-pay

## 2022-05-11 NOTE — Telephone Encounter (Signed)
I called the patient and let her know that her ultrasound was normal and that Dr. Para March will discuss more with her regarding her bleeding at her next appointment. Patient verbalized understanding and denies any other questions.  Alesia Richards, RN 05/11/22

## 2022-05-11 NOTE — Telephone Encounter (Signed)
-----   Message from Warner Mccreedy, MD sent at 05/10/2022 10:32 AM EDT ----- Regarding: Request to call patient with [place] results Hi Could you let this patient know her Ultrasound was normal. Dr. Para March will discuss further with her regarding her bleeding that we started medications for at their visit. Thanks! Dr. Ephriam Jenkins

## 2022-05-26 NOTE — Progress Notes (Deleted)
GYNECOLOGY OFFICE VISIT NOTE  History:   Lydia Gross is a 46 y.o. [phone] here today for follow up for AUB.   She saw Dr. Ephriam Jenkins on 7/6 and reported the following: Has had symptoms for years but does not know exactly how long Periods last 5-7 days Using ~8-10 pads or tampons the first few days On heavy nights having to chaning 4-5 times a nights Has some cramps but not bad Is anemic - taking PNV and iron No symptoms of anemia, last HgB in 03/2021 (by PCP 10.9)  The plan following that visit was for pap smear, TVUS and to try ibuprofen until deciding on another form of birth control to regulate her cycle.  -- Her pap was normal but HPV pos (subtype not determine) -- Her TVUS was completely normal without defects. EL 9.3 mm.   She denies any abnormal vaginal discharge, bleeding, pelvic pain or other concerns.     Past Medical History:  Diagnosis Date   Acid reflux    Anxiety    Chronic neck pain    Gout    Hypertension    Obesity     Past Surgical History:  Procedure Laterality Date   CESAREAN SECTION     CHOLECYSTECTOMY     TUBAL LIGATION      The following portions of the patient's history were reviewed and updated as appropriate: allergies, current medications, past family history, past medical history, past social history, past surgical history and problem list.   Health Maintenance:   Diagnosis  Date Value Ref Range Status  04/19/2022   Final   - Negative for Intraepithelial Lesions or Malignancy (NILM)  04/19/2022 - Benign reactive/reparative changes  Final   HPV pos  No mammogram on file.   Review of Systems:  Pertinent items noted in HPI and remainder of comprehensive ROS otherwise negative.  Physical Exam:  There were no vitals taken for this visit. CONSTITUTIONAL: Well-developed, well-nourished female in no acute distress.  HEENT:  Normocephalic, atraumatic. External right and left ear normal. No scleral icterus.  NECK: Normal range of motion,  supple, no masses noted on observation SKIN: No rash noted. Not diaphoretic. No erythema. No pallor. MUSCULOSKELETAL: Normal range of motion. No edema noted. NEUROLOGIC: Alert and oriented to person, place, and time. Normal muscle tone coordination. No cranial nerve deficit noted. PSYCHIATRIC: Normal mood and affect. Normal behavior. Normal judgment and thought content.  CARDIOVASCULAR: Normal heart rate noted RESPIRATORY: Effort and breath sounds normal, no problems with respiration noted ABDOMEN: No masses noted. No other overt distention noted.    PELVIC: {Blank single:19197::"Deferred","Normal appearing external genitalia; normal urethral meatus; normal appearing vaginal mucosa and cervix.  No abnormal discharge noted.  Normal uterine size, no other palpable masses, no uterine or adnexal tenderness. Performed in the presence of a chaperone"}  Labs and Imaging No results found for this or any previous visit (from the past 168 hour(s)). US PELVIC COMPLETE WITH TRANSVAGINAL  Result Date: 05/01/2022 CLINICAL DATA:  Heavy vaginal bleeding. Patient is high risk for HPV on Pap smear. LMP was 04/15/2022. EXAM: TRANSABDOMINAL AND TRANSVAGINAL ULTRASOUND OF PELVIS TECHNIQUE: Both transabdominal and transvaginal ultrasound examinations of the pelvis were performed. Transabdominal technique was performed for global imaging of the pelvis including uterus, ovaries, adnexal regions, and pelvic cul-de-sac. It was necessary to proceed with endovaginal exam following the transabdominal exam to visualize the endometrium. COMPARISON:  None Available. FINDINGS: Uterus Measurements: 11.3 x 6.7 x 7.6 centimeters = volume: 303.1 mL. No  fibroids or other mass visualized. Endometrium Thickness: 9.3 millimeters.  No focal abnormality visualized. Right ovary Measurements: 2.4 x 2.4 x 2.8 centimeters = volume: 8.5 mL. Normal appearance/no adnexal mass. Left ovary Measurements: 2.9 x 1.8 x 2.4 centimeters = volume: 6.6 mL.  Normal appearance/no adnexal mass. Other findings No abnormal free fluid. IMPRESSION: Normal pelvic ultrasound. Normal thickness of the endometrium. If bleeding remains unresponsive to hormonal or medical therapy, sonohysterogram should be considered for focal lesion work-up. (Ref: Radiological Reasoning: Algorithmic Workup of Abnormal Vaginal Bleeding with Endovaginal Sonography and Sonohysterography. AJR 2008; 614:E31-54) Electronically Signed   By: Norva Pavlov M.D.   On: 05/01/2022 13:32       GYNECOLOGY OFFICE PROCEDURE NOTE   Lydia Gross is a 46 y.o. [phone] here for endometrial biopsy for AUB. Recent ultrasound also showed normal findings, EL 9.3.  Today, she reports no concerning symptoms. Of note, pap on 04/19/2022 was normal, positive HPV.   ENDOMETRIAL BIOPSY     The indications for endometrial biopsy were reviewed.   Risks of the biopsy including cramping, bleeding, infection, uterine perforation, inadequate specimen and need for additional procedures were discussed. Offered alternative of hysteroscopy, dilation and curettage in OR. The patient states she understands the R/B/I/A and agrees to undergo procedure today. Urine pregnancy test was {Blank single:19197::"Negative","Not indicated"}. Consent was signed. Time out was performed.    Patient was positioned in dorsal lithotomy position. A vaginal speculum was placed.  The cervix was visualized and was prepped with Betadine.  A single-toothed tenaculum was placed on the anterior lip of the cervix to stabilize it. The 3 mm pipelle was easily introduced into the endometrial cavity without difficulty to a depth of *** cm, and a {Blank single:19197::"Scant","Moderate"} amount of tissue was obtained after two passes and sent to pathology. The instruments were removed from the patient's vagina. Minimal bleeding from the cervix was noted. The patient tolerated the procedure well.   Assessment and Plan:   1. Abnormal uterine bleeding -  Recommend EMB today and done *** given RF for hyperplasia in setting of otherwise normal [place].  - Reviewed may continue ibuprofen but agree with recommendation for IUD. Also may do provera or Megace for when bleeding is heavier vs TXA.  -    Diagnoses and all orders for this visit:  Abnormal uterine bleeding  Screening mammogram for breast cancer  - Needs mammogram still - will schedule prior to leaving today  Routine preventative health maintenance measures emphasized. Please refer to After Visit Summary for other counseling recommendations.   No follow-ups on file.  Milas Hock, MD, FACOG Obstetrician & Gynecologist, [facility] for [facility], [facility]

## 2022-05-28 ENCOUNTER — Ambulatory Visit: Payer: Medicaid Other | Admitting: Obstetrics and Gynecology

## 2022-05-28 DIAGNOSIS — Z1231 Encounter for screening mammogram for malignant neoplasm of breast: Secondary | ICD-10-CM

## 2022-05-28 DIAGNOSIS — N939 Abnormal uterine and vaginal bleeding, unspecified: Secondary | ICD-10-CM

## 2022-07-04 NOTE — Progress Notes (Signed)
GYNECOLOGY OFFICE VISIT NOTE  History:   Lydia Gross is a 46 y.o. [phone] here today for EMB.  Has had symptoms for years but does not know exactly how long Periods last 5-7 days Using ~8-10 pads or tampons the first few days On heavy nights having to chaning 4-5 times a nights She reports the cramps are painful.  Is anemic - taking PNV and iron, no symptoms of anemia, last HgB in 03/2021 (by PCP 10.9) Has not been previously diagnosed with polyps or fibroids   -- She had an [place] on 7/18 which was negative for abnormality and her EL was normal at 9.3 mm.  -- She had a pap on 7/6 which was normal but HPV positive.   She has a history of c-section: she had one section and 2 SVD.   She has also had a tubal ligation for birth control and would prefer to avoid birth control for treatment of her symptoms.   Today she also reports symptoms that sound consistent with PMDD in the week prior to her period. She already takes Cymbalta for anxiety but does not take it consistently.     Past Medical History:  Diagnosis Date   Acid reflux    Anxiety    Chronic neck pain    Gout    Hypertension    Obesity     Past Surgical History:  Procedure Laterality Date   CESAREAN SECTION     CHOLECYSTECTOMY     TUBAL LIGATION      The following portions of the patient's history were reviewed and updated as appropriate: allergies, current medications, past family history, past medical history, past social history, past surgical history and problem list.   Health Maintenance:   Normal pap but HPV positive on 7/6  Diagnosis  Date Value Ref Range Status  04/19/2022   Final   - Negative for Intraepithelial Lesions or Malignancy (NILM)  04/19/2022 - Benign reactive/reparative changes  Final     No MXR on file   Review of Systems:  Pertinent items noted in HPI and remainder of comprehensive ROS otherwise negative.  Physical Exam:  BP 118/64   Pulse (!) 58   Ht 5\' 9"  (1.753 m)   Wt  (!) 340 lb (154.2 kg)   LMP 07/03/2022 (Exact Date)   BMI 50.21 kg/m  CONSTITUTIONAL: Well-developed, well-nourished female in no acute distress.  HEENT:  Normocephalic, atraumatic. External right and left ear normal. No scleral icterus.  NECK: Normal range of motion, supple, no masses noted on observation SKIN: No rash noted. Not diaphoretic. No erythema. No pallor. MUSCULOSKELETAL: Normal range of motion. No edema noted. NEUROLOGIC: Alert and oriented to person, place, and time. Normal muscle tone coordination. No cranial nerve deficit noted. PSYCHIATRIC: Normal mood and affect. Normal behavior. Normal judgment and thought content.  PELVIC: Deferred  Assessment and Plan:   1. Abnormal uterine bleeding - Pap overall normal. Reviewed recommended cotesting in one year.  - Discussed EMB - she declines today as she was not prepared for this but would like to come back.  - Discussed options for bleeding assuming EMB does not show hyperplasia: Expectant management, TXA, hormonal therapy (would avoid estrogen due to HTN), Orlissa/Myfembree (may also work given association she has with cramping/pain), and surgery.  - Reviewed surgical options - ablation and hysterectomy. Would advise against ablation given risk factor for endometrial cancer which we discussed. Would also advise against hysterectomy due to risks of anesthesia and surgery itself.  However, if she desires definitive management, would recommend follow up with [facility].  - Would recommend LNG-IUD or GNRH antagonists as the best option for both prevention of hyperplasia and also for treatment of bleeding.    2. Encounter for screening mammogram for malignant neoplasm of breast She has not yet gone but we will aid in scheduling her today.  -     MM 3D SCREEN BREAST BILATERAL; Future    Routine preventative health maintenance measures emphasized. Please refer to After Visit Summary for other counseling recommendations.   Return in about  2 weeks (around 07/23/2022) for For EMB.  Radene Gunning, MD, [place] for [facility], [place]

## 2022-07-09 ENCOUNTER — Encounter: Payer: Self-pay | Admitting: Obstetrics and Gynecology

## 2022-07-09 ENCOUNTER — Other Ambulatory Visit: Payer: Self-pay

## 2022-07-09 ENCOUNTER — Ambulatory Visit (INDEPENDENT_AMBULATORY_CARE_PROVIDER_SITE_OTHER): Payer: Medicaid Other | Admitting: Obstetrics and Gynecology

## 2022-07-09 VITALS — BP 118/64 | HR 58 | Ht 69.0 in | Wt 340.0 lb

## 2022-07-09 DIAGNOSIS — N939 Abnormal uterine and vaginal bleeding, unspecified: Secondary | ICD-10-CM

## 2022-07-09 DIAGNOSIS — Z1231 Encounter for screening mammogram for malignant neoplasm of breast: Secondary | ICD-10-CM

## 2022-07-09 NOTE — Patient Instructions (Signed)
Lydia Gross or Lydia Gross

## 2022-08-10 NOTE — Progress Notes (Deleted)
      GYNECOLOGY OFFICE PROCEDURE NOTE   Lydia Gross is a 46 y.o. [phone] here for endometrial biopsy for AUB.    ENDOMETRIAL BIOPSY     The indications for endometrial biopsy were reviewed.   Risks of the biopsy including cramping, bleeding, infection, uterine perforation, inadequate specimen and need for additional procedures were discussed. Offered alternative of hysteroscopy, dilation and curettage in OR. The patient states she understands the R/B/I/A and agrees to undergo procedure today. Urine pregnancy test was {Blank single:19197::"Negative","Not indicated"}. Consent was signed. Time out was performed.    Patient was positioned in dorsal lithotomy position. A vaginal speculum was placed.  The cervix was visualized and was prepped with Betadine.  A single-toothed tenaculum was placed on the anterior lip of the cervix to stabilize it. The 3 mm pipelle was easily introduced into the endometrial cavity without difficulty to a depth of *** cm, and a {Blank single:19197::"Scant","Moderate"} amount of tissue was obtained after two passes and sent to pathology. The instruments were removed from the patient's vagina. Minimal bleeding from the cervix was noted. The patient tolerated the procedure well.   Patient was given post procedure instructions.  Will follow up pathology and manage accordingly; patient will be contacted with results and recommendations.  Routine preventative health maintenance measures emphasized.       Radene Gunning, MD, [place] for [facility], [place] [facility] [place]

## 2022-08-13 ENCOUNTER — Ambulatory Visit: Payer: Medicaid Other | Admitting: Obstetrics and Gynecology

## 2022-09-26 ENCOUNTER — Telehealth: Payer: Self-pay

## 2022-09-26 NOTE — Telephone Encounter (Signed)
Telephoned patient at mobile number. Left a voice message with [facility] contact information. 

## 2023-01-05 ENCOUNTER — Emergency Department (HOSPITAL_COMMUNITY): Payer: Medicaid Other

## 2023-01-05 ENCOUNTER — Emergency Department (HOSPITAL_COMMUNITY)
Admission: EM | Admit: 2023-01-05 | Discharge: 2023-01-05 | Disposition: A | Discharge status: Home / Self Care | Payer: Medicaid Other | Attending: Emergency Medicine | Admitting: Emergency Medicine

## 2023-01-05 ENCOUNTER — Other Ambulatory Visit: Payer: Self-pay

## 2023-01-05 DIAGNOSIS — R0600 Dyspnea, unspecified: Secondary | ICD-10-CM | POA: Diagnosis present

## 2023-01-05 DIAGNOSIS — R0602 Shortness of breath: Secondary | ICD-10-CM | POA: Insufficient documentation

## 2023-01-05 LAB — BASIC METABOLIC PANEL
Anion gap: 7 (ref 5–15)
BUN: 14 mg/dL (ref 6–20)
CO2: 26 mmol/L (ref 22–32)
Calcium: 8.3 mg/dL — ABNORMAL LOW (ref 8.9–10.3)
Chloride: 102 mmol/L (ref 98–111)
Creatinine, Ser: 0.95 mg/dL (ref 0.44–1.00)
GFR, Estimated: >60 mL/min (ref >60)
Glucose, Bld: 123 mg/dL — ABNORMAL HIGH (ref 70–99)
Potassium: 3.2 mmol/L — ABNORMAL LOW (ref 3.5–5.1)
Sodium: 135 mmol/L (ref 135–145)

## 2023-01-05 LAB — CBC WITH DIFFERENTIAL/PLATELET
Abs Immature Granulocytes: 0.02 10*3/uL (ref 0.00–0.07)
Basophils Absolute: 0 10*3/uL (ref 0.0–0.1)
Basophils Relative: 0 %
Eosinophils Absolute: 0.1 10*3/uL (ref 0.0–0.5)
Eosinophils Relative: 2 %
HCT: 37.1 % (ref 36.0–46.0)
Hemoglobin: 11.8 g/dL — ABNORMAL LOW (ref 12.0–15.0)
Immature Granulocytes: 0 %
Lymphocytes Relative: 36 %
Lymphs Abs: 2.8 10*3/uL (ref 0.7–4.0)
MCH: 25 pg — ABNORMAL LOW (ref 26.0–34.0)
MCHC: 31.8 g/dL (ref 30.0–36.0)
MCV: 78.6 fL — ABNORMAL LOW (ref 80.0–100.0)
Monocytes Absolute: 0.6 10*3/uL (ref 0.1–1.0)
Monocytes Relative: 7 %
Neutro Abs: 4.3 10*3/uL (ref 1.7–7.7)
Neutrophils Relative %: 55 %
Platelets: 280 10*3/uL (ref 150–400)
RBC: 4.72 MIL/uL (ref 3.87–5.11)
RDW: 16.5 % — ABNORMAL HIGH (ref 11.5–15.5)
WBC: 7.8 10*3/uL (ref 4.0–10.5)
nRBC: 0 % (ref 0.0–0.2)

## 2023-01-05 LAB — D-DIMER, QUANTITATIVE: D-Dimer, Quant: 0.67 ug/mL-FEU — ABNORMAL HIGH (ref 0.00–0.50)

## 2023-01-05 MED ORDER — SODIUM CHLORIDE (PF) 0.9 % IJ SOLN
INTRAMUSCULAR | Status: AC
  Filled 2023-01-05: qty 50

## 2023-01-05 MED ORDER — IOHEXOL 300 MG/ML  SOLN
75.0000 mL | Freq: Once | INTRAMUSCULAR | Status: AC | PRN
  Administered 2023-01-05: 75 mL via INTRAVENOUS

## 2023-01-05 NOTE — ED Notes (Signed)
Pt provided discharge instructions and prescription information. Pt was given the opportunity to ask questions and questions were answered.   

## 2023-01-05 NOTE — ED Provider Notes (Signed)
[facility] Provider Note   CSN: [id number] Arrival date & time: 01/05/23  1819     History  Chief Complaint  Patient presents with   Shortness of Breath    Lydia Gross is a 47 y.o. female.  47 year old female presents with intermittent trouble breathing times several days.  Recently finished course of antibiotics for respiratory infection.  States that today she was driving in office and trouble inhaling.  It resolved on its own.  Denies any history of asthma.  Denied any anginal type symptoms.  Lydia Gross is back to baseline at this time.  No treatment use prior to arrival       Home Medications Prior to Admission medications   Medication Sig Start Date End Date Taking? Authorizing Provider  ALPRAZolam Duanne Moron) 0.5 MG tablet Take 0.5 mg by mouth 2 (two) times daily as needed. 04/22/22   [provider]  atorvastatin (LIPITOR) 20 MG tablet Take by mouth. 02/14/22   [provider]  baclofen (LIORESAL) 10 MG tablet Take by mouth. 01/26/21   [provider]  DULoxetine (CYMBALTA) 60 MG capsule Take 60 mg by mouth daily. 04/11/22   [provider]  famotidine (PEPCID) 20 MG tablet Take by mouth. 10/05/20   [provider]  hydrochlorothiazide (HYDRODIURIL) 25 MG tablet Take 25 mg by mouth every morning.     [provider]  IRON PO Take 1 tablet by mouth every morning.     [provider]  olmesartan (BENICAR) 20 MG tablet Take 20 mg by mouth daily. 02/27/22   [provider]  pantoprazole (PROTONIX) 40 MG tablet Take 40 mg by mouth daily. 04/17/22   [provider]  Prenatal Vit-Fe Fumarate-FA (MULTIVITAMIN-PRENATAL) 27-0.8 MG TABS tablet Take 1 tablet by mouth daily at 12 noon.    [provider]      Allergies    Patient has no known allergies.    Review of Systems   Review of Systems  All other systems reviewed and are negative.   Physical  Exam Updated Vital Signs BP (!) 171/140 (BP Location: Right Arm)   Pulse 74   Temp 98.3 F (36.8 C) (Oral)   Resp 16   Ht 1.753 m (5\' 9" )   Wt (!) 154 kg   SpO2 98%   BMI 50.14 kg/m  Physical Exam Vitals and nursing note reviewed.  Constitutional:      General: She is not in acute distress.    Appearance: Normal appearance. She is well-developed. She is not toxic-appearing.  HENT:     Head: Normocephalic and atraumatic.  Eyes:     General: Lids are normal.     Conjunctiva/sclera: Conjunctivae normal.     Pupils: Pupils are equal, round, and reactive to light.  Neck:     Thyroid: No thyroid mass.     Trachea: No tracheal deviation.  Cardiovascular:     Rate and Rhythm: Normal rate and regular rhythm.     Heart sounds: Normal heart sounds. No murmur heard.    No gallop.  Pulmonary:     Effort: Pulmonary effort is normal. No respiratory distress.     Breath sounds: Normal breath sounds. No stridor. No decreased breath sounds, wheezing, rhonchi or rales.  Abdominal:     General: There is no distension.     Palpations: Abdomen is soft.     Tenderness: There is no abdominal tenderness. There is no rebound.  Musculoskeletal:  General: No tenderness. Normal range of motion.     Cervical back: Normal range of motion and neck supple.  Skin:    General: Skin is warm and dry.     Findings: No abrasion or rash.  Neurological:     Mental Status: She is alert and oriented to person, place, and time. Mental status is at baseline.     GCS: GCS eye subscore is 4. GCS verbal subscore is 5. GCS motor subscore is 6.     Cranial Nerves: No cranial nerve deficit.     Sensory: No sensory deficit.     Motor: Motor function is intact.  Psychiatric:        Attention and Perception: Attention normal.        Speech: Speech normal.        Behavior: Behavior normal.     ED Results / Procedures / Treatments   Labs (all labs ordered are listed, but only abnormal results are  displayed) Labs Reviewed - No data to display  EKG None  Radiology No results found.  Procedures Procedures    Medications Ordered in ED Medications - No data to display  ED Course/ Medical Decision Making/ A&P                             Medical Decision Making Amount and/or Complexity of Data Reviewed Labs: ordered. Radiology: ordered.  Risk Prescription drug management.   Presented with shortness of breath.  Concern for possible PE.  D-dimer was mildly positive.  Subsequently had chest CT which was negative for PE.  Dyspnea has resolved and patient be discharged home.        Final Clinical Impression(s) / ED Diagnoses Final diagnoses:  None    Rx / DC Orders ED Discharge Orders     None         Lacretia Leigh, MD 01/05/23 2228

## 2023-01-05 NOTE — ED Triage Notes (Signed)
Pt reports sob x few days. Also c/o right shoulder pain and bilateral hand and feet numbness

## 2023-01-05 NOTE — ED Notes (Signed)
Pt presents with intermittent ShOB and reports of tingling in her hands and on her legs. Pt also reports intermittent pinching feeling in her R shoulder.

## 2023-01-11 ENCOUNTER — Emergency Department (HOSPITAL_COMMUNITY)
Admission: EM | Admit: 2023-01-11 | Discharge: 2023-01-12 | Disposition: A | Discharge status: Home / Self Care | Payer: Medicaid Other | Attending: Emergency Medicine | Admitting: Emergency Medicine

## 2023-01-11 ENCOUNTER — Encounter (HOSPITAL_COMMUNITY): Payer: Self-pay | Admitting: Emergency Medicine

## 2023-01-11 ENCOUNTER — Other Ambulatory Visit: Payer: Self-pay

## 2023-01-11 DIAGNOSIS — R0600 Dyspnea, unspecified: Secondary | ICD-10-CM | POA: Diagnosis not present

## 2023-01-11 DIAGNOSIS — N179 Acute kidney failure, unspecified: Secondary | ICD-10-CM | POA: Insufficient documentation

## 2023-01-11 DIAGNOSIS — E860 Dehydration: Secondary | ICD-10-CM | POA: Diagnosis not present

## 2023-01-11 DIAGNOSIS — R0602 Shortness of breath: Secondary | ICD-10-CM | POA: Diagnosis present

## 2023-01-11 LAB — CBC WITH DIFFERENTIAL/PLATELET
Abs Immature Granulocytes: 0.02 K/uL (ref 0.00–0.07)
Basophils Absolute: 0 K/uL (ref 0.0–0.1)
Basophils Relative: 1 %
Eosinophils Absolute: 0.1 K/uL (ref 0.0–0.5)
Eosinophils Relative: 1 %
HCT: 38.9 % (ref 36.0–46.0)
Hemoglobin: 12.9 g/dL (ref 12.0–15.0)
Immature Granulocytes: 0 %
Lymphocytes Relative: 33 %
Lymphs Abs: 2 K/uL (ref 0.7–4.0)
MCH: 25.7 pg — ABNORMAL LOW (ref 26.0–34.0)
MCHC: 33.2 g/dL (ref 30.0–36.0)
MCV: 77.6 fL — ABNORMAL LOW (ref 80.0–100.0)
Monocytes Absolute: 0.5 K/uL (ref 0.1–1.0)
Monocytes Relative: 8 %
Neutro Abs: 3.5 K/uL (ref 1.7–7.7)
Neutrophils Relative %: 57 %
Platelets: 289 K/uL (ref 150–400)
RBC: 5.01 MIL/uL (ref 3.87–5.11)
RDW: 16.9 % — ABNORMAL HIGH (ref 11.5–15.5)
WBC: 6.1 K/uL (ref 4.0–10.5)
nRBC: 0 % (ref 0.0–0.2)

## 2023-01-11 NOTE — ED Provider Triage Note (Signed)
Emergency Medicine Provider Triage Evaluation Note  Lydia Gross , a 47 y.o. female  was evaluated in triage.  Pt complains of generally not feeling well-- feels dehydrated, tight feeling in throat, "heavy heart" and SOB.  Seen recently at [facility] for SOB as well with negative work-up.  Denies hx of CAD.  Review of Systems  Positive: SOB, chest pain Negative: fever  Physical Exam  BP 119/65   Pulse 100   Temp 98.3 F (36.8 C) (Oral)   Resp 16   SpO2 99%  Gen:   Awake, no distress   Resp:  Normal effort  MSK:   Moves extremities without difficulty  Other:  Rambling in triage, appears somewhat anxious  Medical Decision Making  Medically screening exam initiated at 11:13 PM.  Appropriate orders placed.  Lydia Gross was informed that the remainder of the evaluation will be completed by another provider, this initial triage assessment does not replace that evaluation, and the importance of remaining in the ED until their evaluation is complete.    Multiple complaints, notably SOB/chest discomfort. Seen recently at [facility] for similar with negative work-up including CTA chest that was negative for PE or other acute findings. EKG, labs, CXR ordered.   Larene Pickett, PA-C 01/11/23 2319

## 2023-01-11 NOTE — ED Triage Notes (Signed)
Pt reports SOB, throat pain, feeling like throat closing. Multiple complaints such as dry mouth.

## 2023-01-12 ENCOUNTER — Emergency Department (HOSPITAL_COMMUNITY): Payer: Medicaid Other

## 2023-01-12 LAB — BASIC METABOLIC PANEL
Anion gap: 9 (ref 5–15)
BUN: 11 mg/dL (ref 6–20)
CO2: 26 mmol/L (ref 22–32)
Calcium: 9.2 mg/dL (ref 8.9–10.3)
Chloride: 100 mmol/L (ref 98–111)
Creatinine, Ser: 1.18 mg/dL — ABNORMAL HIGH (ref 0.44–1.00)
GFR, Estimated: 58 mL/min — ABNORMAL LOW (ref >60)
Glucose, Bld: 162 mg/dL — ABNORMAL HIGH (ref 70–99)
Potassium: 3.2 mmol/L — ABNORMAL LOW (ref 3.5–5.1)
Sodium: 135 mmol/L (ref 135–145)

## 2023-01-12 LAB — BRAIN NATRIURETIC PEPTIDE: B Natriuretic Peptide: 4.7 pg/mL (ref 0.0–100.0)

## 2023-01-12 LAB — TROPONIN I (HIGH SENSITIVITY)
Troponin I (High Sensitivity): 3 ng/L (ref <18)
Troponin I (High Sensitivity): 3 ng/L (ref <18)

## 2023-01-12 MED ORDER — LACTATED RINGERS IV BOLUS
1000.0000 mL | Freq: Once | INTRAVENOUS | Status: AC
  Administered 2023-01-12: 1000 mL via INTRAVENOUS

## 2023-01-12 MED ORDER — LIDOCAINE VISCOUS HCL 2 % MT SOLN
15.0000 mL | Freq: Once | OROMUCOSAL | Status: AC
  Administered 2023-01-12: 15 mL via ORAL
  Filled 2023-01-12: qty 15

## 2023-01-12 MED ORDER — ALUM & MAG HYDROXIDE-SIMETH 200-200-20 MG/5ML PO SUSP
30.0000 mL | Freq: Once | ORAL | Status: AC
  Administered 2023-01-12: 30 mL via ORAL
  Filled 2023-01-12: qty 30

## 2023-01-12 NOTE — ED Notes (Signed)
Patient ambulatory to restroom without assist.

## 2023-01-12 NOTE — ED Provider Notes (Signed)
[facility] Provider Note   CSN: [id number] Arrival date & time: 01/11/23  2240     History {Add pertinent medical, surgical, social history, OB history to HPI:1} Chief Complaint  Patient presents with   Shortness of Breath    Lydia Gross is a 47 y.o. female.  47 year old female who presents the ER today secondary to persistent shortness of breath.  She was seen a week ago at [facility] had a PE study and a further workup done that was negative.  Saw her physician in the meantime thought maybe it could be anxiety.  She states she had another episode tonight where she felt short of breath and some chest tightness.  She states that it made her mouth wet.  She states it was kind of sharp in nature went from her epigastric area upwards.  Has history of indigestion.  She states that they started Prozac recently.  No fever or cough.  No diarrhea or constipation.  No other associated symptoms.  Has never been tested for sleep apnea.  Has never had an EGD. States sometimes she has a sore throat and feels that her mouth/throat are dry. Has had intermittent episodes of LLE edema at work that improves when she wakes up.   Shortness of Breath      Home Medications Prior to Admission medications   Medication Sig Start Date End Date Taking? Authorizing Provider  ALPRAZolam Duanne Moron) 0.5 MG tablet Take 0.5 mg by mouth 2 (two) times daily as needed. 04/22/22   [provider]  atorvastatin (LIPITOR) 20 MG tablet Take by mouth. 02/14/22   [provider]  baclofen (LIORESAL) 10 MG tablet Take by mouth. 01/26/21   [provider]  DULoxetine (CYMBALTA) 60 MG capsule Take 60 mg by mouth daily. 04/11/22   [provider]  famotidine (PEPCID) 20 MG tablet Take by mouth. 10/05/20   [provider]  hydrochlorothiazide (HYDRODIURIL) 25 MG tablet Take 25 mg by mouth every morning.     [provider]  IRON PO  Take 1 tablet by mouth every morning.     [provider]  olmesartan (BENICAR) 20 MG tablet Take 20 mg by mouth daily. 02/27/22   [provider]  pantoprazole (PROTONIX) 40 MG tablet Take 40 mg by mouth daily. 04/17/22   [provider]  Prenatal Vit-Fe Fumarate-FA (MULTIVITAMIN-PRENATAL) 27-0.8 MG TABS tablet Take 1 tablet by mouth daily at 12 noon.    [provider]      Allergies    Patient has no known allergies.    Review of Systems   Review of Systems  Respiratory:  Positive for shortness of breath.     Physical Exam Updated Vital Signs BP 136/64 (BP Location: Left Arm)   Pulse 86   Temp 97.8 F (36.6 C) (Oral)   Resp 16   LMP 12/30/2022   SpO2 100%  Physical Exam Vitals and nursing note reviewed.  Constitutional:      Appearance: She is well-developed. She is obese.  HENT:     Head: Normocephalic and atraumatic.  Cardiovascular:     Rate and Rhythm: Normal rate and regular rhythm.  Pulmonary:     Effort: No tachypnea or respiratory distress.     Breath sounds: No stridor. No decreased breath sounds, wheezing or rales.  Abdominal:     General: There is no distension.  Musculoskeletal:        General: Normal range  of motion.     Cervical back: Normal range of motion.     Right lower leg: No edema.     Left lower leg: No edema.  Skin:    General: Skin is warm and dry.  Neurological:     Mental Status: She is alert.     ED Results / Procedures / Treatments   Labs (all labs ordered are listed, but only abnormal results are displayed) Labs Reviewed  CBC WITH DIFFERENTIAL/PLATELET - Abnormal; Notable for the following components:      Result Value   MCV 77.6 (*)    MCH 25.7 (*)    RDW 16.9 (*)    All other components within normal limits  BASIC METABOLIC PANEL - Abnormal; Notable for the following components:   Potassium 3.2 (*)    Glucose, Bld 162 (*)    Creatinine, Ser 1.18 (*)    GFR, Estimated 58 (*)    All other  components within normal limits  BRAIN NATRIURETIC PEPTIDE  TROPONIN I (HIGH SENSITIVITY)  TROPONIN I (HIGH SENSITIVITY)    EKG EKG Interpretation  Date/Time:  Friday January 11 2023 22:41:15 EDT Ventricular Rate:  89 PR Interval:  168 QRS Duration: 84 QT Interval:  378 QTC Calculation: 459 R Axis:   -6 Text Interpretation: Normal sinus rhythm Nonspecific T wave abnormality Abnormal ECG When compared with ECG of 04-Dec-2013 11:30, Premature ventricular complexes are no longer present Confirmed by Delora Fuel ([id number]) on 01/11/2023 11:31:08 PM  Radiology DG Chest 2 View  Result Date: 01/12/2023 CLINICAL DATA:  Chest pain and shortness of breath, initial encounter EXAM: CHEST - 2 VIEW COMPARISON:  01/05/2023 FINDINGS: The heart size and mediastinal contours are within normal limits. Both lungs are clear. The visualized skeletal structures are unremarkable. IMPRESSION: No active cardiopulmonary disease. Electronically Signed   By: Inez Catalina M.D.   On: 01/12/2023 00:21    Procedures Procedures    Medications Ordered in ED Medications  lactated ringers bolus 1,000 mL (1,000 mLs Intravenous New Bag/Given 01/12/23 0413)  alum & mag hydroxide-simeth (MAALOX/MYLANTA) 200-200-20 MG/5ML suspension 30 mL (30 mLs Oral Given 01/12/23 0416)    And  lidocaine (XYLOCAINE) 2 % viscous mouth solution 15 mL (15 mLs Oral Given 01/12/23 0416)    ED Course/ Medical Decision Making/ A&P                             Medical Decision Making Amount and/or Complexity of Data Reviewed Labs: ordered.  Risk OTC drugs. Prescription drug management.   Patient has a mild AKI here.  Will give some fluids to explain her dry throat.  I reviewed her CT scan from last week does not show any esophageal, tracheal or pulmonary abnormalities.  Ambulated with pulse ox without any abnormalities.  No indication for further workup, shortness of breath.  She continue following with her doctor for the same.  Would also  consider referring her to GI for possible EGD.  {Document critical care time when appropriate:1} {Document review of labs and clinical decision tools ie heart score, Chads2Vasc2 etc:1}  {Document your independent review of radiology images, and any outside records:1} {Document your discussion with family members, caretakers, and with consultants:1} {Document social determinants of health affecting pt's care:1} {Document your decision making why or why not admission, treatments were needed:1} Final Clinical Impression(s) / ED Diagnoses Final diagnoses:  None    Rx / DC Orders ED Discharge Orders  None       

## 2023-01-12 NOTE — ED Notes (Signed)
AVS reviewed with pt prior to discharge. Pt verbalizes understanding. Belongings with pt upon depart. Ambulatory to POV by self. 

## 2023-01-12 NOTE — ED Notes (Signed)
Pt ambulated to yellow zone from blue zone with O2 remaining at 100% room air and heart rate steady at 95 bpm. Patient stated she did not feel short of breath once arriving back into her room. Pt placed back onto the monitor, with call bell in reach.

## 2023-01-13 ENCOUNTER — Encounter (HOSPITAL_COMMUNITY): Payer: Self-pay

## 2023-01-13 ENCOUNTER — Emergency Department (HOSPITAL_COMMUNITY)
Admission: EM | Admit: 2023-01-13 | Discharge: 2023-01-13 | Disposition: A | Discharge status: Home / Self Care | Payer: Medicaid Other | Attending: Emergency Medicine | Admitting: Emergency Medicine

## 2023-01-13 ENCOUNTER — Emergency Department (HOSPITAL_COMMUNITY): Payer: Medicaid Other

## 2023-01-13 ENCOUNTER — Other Ambulatory Visit: Payer: Self-pay

## 2023-01-13 DIAGNOSIS — Z79899 Other long term (current) drug therapy: Secondary | ICD-10-CM | POA: Diagnosis not present

## 2023-01-13 DIAGNOSIS — R09A0 Foreign body sensation, unspecified: Secondary | ICD-10-CM | POA: Insufficient documentation

## 2023-01-13 DIAGNOSIS — I10 Essential (primary) hypertension: Secondary | ICD-10-CM | POA: Diagnosis not present

## 2023-01-13 DIAGNOSIS — R09A2 Foreign body sensation, throat: Secondary | ICD-10-CM

## 2023-01-13 DIAGNOSIS — R079 Chest pain, unspecified: Secondary | ICD-10-CM | POA: Diagnosis present

## 2023-01-13 LAB — BASIC METABOLIC PANEL
Anion gap: 11 (ref 5–15)
BUN: 16 mg/dL (ref 6–20)
CO2: 25 mmol/L (ref 22–32)
Calcium: 9.2 mg/dL (ref 8.9–10.3)
Chloride: 103 mmol/L (ref 98–111)
Creatinine, Ser: 1.14 mg/dL — ABNORMAL HIGH (ref 0.44–1.00)
GFR, Estimated: >60 mL/min (ref >60)
Glucose, Bld: 117 mg/dL — ABNORMAL HIGH (ref 70–99)
Potassium: 3.1 mmol/L — ABNORMAL LOW (ref 3.5–5.1)
Sodium: 139 mmol/L (ref 135–145)

## 2023-01-13 LAB — CBC
HCT: 37.4 % (ref 36.0–46.0)
Hemoglobin: 12.2 g/dL (ref 12.0–15.0)
MCH: 25.5 pg — ABNORMAL LOW (ref 26.0–34.0)
MCHC: 32.6 g/dL (ref 30.0–36.0)
MCV: 78.1 fL — ABNORMAL LOW (ref 80.0–100.0)
Platelets: 285 10*3/uL (ref 150–400)
RBC: 4.79 MIL/uL (ref 3.87–5.11)
RDW: 17 % — ABNORMAL HIGH (ref 11.5–15.5)
WBC: 7.5 10*3/uL (ref 4.0–10.5)
nRBC: 0 % (ref 0.0–0.2)

## 2023-01-13 LAB — TROPONIN I (HIGH SENSITIVITY)
Troponin I (High Sensitivity): 3 ng/L (ref <18)
Troponin I (High Sensitivity): 3 ng/L (ref <18)

## 2023-01-13 LAB — I-STAT BETA HCG BLOOD, ED (MC, WL, AP ONLY): I-stat hCG, quantitative: <5 m[IU]/mL (ref <5)

## 2023-01-13 MED ORDER — FAMOTIDINE 20 MG PO TABS
20.0000 mg | ORAL_TABLET | Freq: Once | ORAL | Status: AC
  Administered 2023-01-13: 20 mg via ORAL
  Filled 2023-01-13: qty 1

## 2023-01-13 MED ORDER — ALUM & MAG HYDROXIDE-SIMETH 200-200-20 MG/5ML PO SUSP
30.0000 mL | Freq: Once | ORAL | Status: AC
  Administered 2023-01-13: 30 mL via ORAL
  Filled 2023-01-13: qty 30

## 2023-01-13 MED ORDER — IOHEXOL 300 MG/ML  SOLN
75.0000 mL | Freq: Once | INTRAMUSCULAR | Status: AC | PRN
  Administered 2023-01-13: 75 mL via INTRAVENOUS

## 2023-01-13 MED ORDER — FAMOTIDINE 20 MG PO TABS: 20.0000 mg | ORAL_TABLET | Freq: Two times a day (BID) | ORAL | 0 refills | Status: DC

## 2023-01-13 NOTE — Discharge Instructions (Addendum)
Please take your medications as prescribed. Take tylenol/ibuprofen for pain. I recommend close follow-up with your primary care physician and ENT for reevaluation.  Please do not hesitate to return to emergency department if worrisome signs symptoms we discussed become apparent.

## 2023-01-13 NOTE — ED Provider Notes (Signed)
[facility] EMERGENCY DEPARTMENT AT [facility] Provider Note   CSN: [id number] Arrival date & time: 01/13/23  1812     History {Add pertinent medical, surgical, social history, OB history to HPI:1} Chief Complaint  Patient presents with  . Chest Pain  . Shortness of Breath    Lydia Gross is a 47 y.o. female past medical history of acid reflux, anxiety, hypertension presents today for evaluation of chest pain and shortness of breath.  Patient states she has had chest pain in the last 7 days.  States when she lay down in bed it is hard for her to catch her breath and she cannot swallow.  Denies any fever, cough, nausea, vomiting, bowel changes, urinary symptoms.  She saw her physician who thought maybe it could be anxiety.  States sometimes she has a sore throat and feels that her mouth are dry. Per chart review patient was seen twice in the last 7 days.  She got CT angio of the chest which was negative.   Chest Pain Associated symptoms: shortness of breath   Shortness of Breath Associated symptoms: chest pain       Past Medical History:  Diagnosis Date  . Acid reflux   . Anxiety   . Chronic neck pain   . Gout   . Hypertension   . Obesity    Past Surgical History:  Procedure Laterality Date  . CESAREAN SECTION    . CHOLECYSTECTOMY    . TUBAL LIGATION        Home Medications Prior to Admission medications   Medication Sig Start Date End Date Taking? Authorizing Provider  ALPRAZolam Duanne Moron) 0.5 MG tablet Take 0.5 mg by mouth 2 (two) times daily as needed. 04/22/22   [provider]  atorvastatin (LIPITOR) 20 MG tablet Take by mouth. 02/14/22   [provider]  baclofen (LIORESAL) 10 MG tablet Take by mouth. 01/26/21   [provider]  DULoxetine (CYMBALTA) 60 MG capsule Take 60 mg by mouth daily. 04/11/22   [provider]  famotidine (PEPCID) 20 MG tablet Take by mouth. 10/05/20   [provider]   hydrochlorothiazide (HYDRODIURIL) 25 MG tablet Take 25 mg by mouth every morning.     [provider]  IRON PO Take 1 tablet by mouth every morning.     [provider]  olmesartan (BENICAR) 20 MG tablet Take 20 mg by mouth daily. 02/27/22   [provider]  pantoprazole (PROTONIX) 40 MG tablet Take 40 mg by mouth daily. 04/17/22   [provider]  Prenatal Vit-Fe Fumarate-FA (MULTIVITAMIN-PRENATAL) 27-0.8 MG TABS tablet Take 1 tablet by mouth daily at 12 noon.    [provider]      Allergies    Patient has no known allergies.    Review of Systems   Review of Systems  Respiratory:  Positive for shortness of breath.   Cardiovascular:  Positive for chest pain.    Physical Exam Updated Vital Signs BP 136/81   Pulse 76   Temp 98.1 F (36.7 C) (Oral)   Resp 19   Ht 5\' 9"  (1.753 m)   Wt (!) 148.8 kg   LMP 12/30/2022   SpO2 100%   BMI 48.44 kg/m  Physical Exam  ED Results / Procedures / Treatments   Labs (all labs ordered are listed, but only abnormal results are displayed) Labs Reviewed  BASIC METABOLIC PANEL - Abnormal; Notable for the following components:  Result Value   Potassium 3.1 (*)    Glucose, Bld 117 (*)    Creatinine, Ser 1.14 (*)    All other components within normal limits  CBC - Abnormal; Notable for the following components:   MCV 78.1 (*)    MCH 25.5 (*)    RDW 17.0 (*)    All other components within normal limits  I-STAT BETA HCG BLOOD, ED ([facility], [facility], [facility] ONLY)  TROPONIN I (HIGH SENSITIVITY)  TROPONIN I (HIGH SENSITIVITY)    EKG None  Radiology DG Chest 2 View  Result Date: 01/13/2023 CLINICAL DATA:  Shortness of breath EXAM: CHEST - 2 VIEW COMPARISON:  CXR 01/12/23 FINDINGS: No pleural effusion. No pneumothorax. No focal airspace opacity. Normal cardiac and mediastinal contours. No radiographically apparent displaced rib fractures. Visualized upper abdomen is unremarkable. Vertebral body heights are  maintained IMPRESSION: No focal airspace opacity. Electronically Signed   By: Marin Roberts M.D.   On: 01/13/2023 19:02   DG Chest 2 View  Result Date: 01/12/2023 CLINICAL DATA:  Chest pain and shortness of breath, initial encounter EXAM: CHEST - 2 VIEW COMPARISON:  01/05/2023 FINDINGS: The heart size and mediastinal contours are within normal limits. Both lungs are clear. The visualized skeletal structures are unremarkable. IMPRESSION: No active cardiopulmonary disease. Electronically Signed   By: Inez Catalina M.D.   On: 01/12/2023 00:21    Procedures Procedures  {Document cardiac monitor, telemetry assessment procedure when appropriate:1}  Medications Ordered in ED Medications  famotidine (PEPCID) tablet 20 mg (has no administration in time range)  alum & mag hydroxide-simeth (MAALOX/MYLANTA) 200-200-20 MG/5ML suspension 30 mL (has no administration in time range)    ED Course/ Medical Decision Making/ A&P   {   Click here for ABCD2, HEART and other calculatorsREFRESH Note before signing :1}                          Medical Decision Making Amount and/or Complexity of Data Reviewed Labs: ordered. Radiology: ordered.  Risk OTC drugs.   This patient presents to the ED for ***, this involves an extensive number of treatment options, and is a complaint that carries with a high risk of complications and morbidity.  The differential diagnosis includes ***.  This is not an exhaustive list.  Lab tests: I ordered and personally interpreted labs.  The pertinent results include: WBC unremarkable. Hbg unremarkable. Platelets unremarkable. Electrolytes unremarkable. BUN, creatinine unremarkable. ***  Imaging studies: I ordered imaging studies. I personally reviewed, interpreted imaging and agree with the radiologist's interpretations. The results include: ***   Problem list/ ED course/ Critical interventions/ Medical management: HPI: See above Vital signs ***within normal range and  stable throughout visit. Laboratory/imaging studies significant for: See above. On physical examination, patient is afebrile and appears in no acute distress. ***Based on patient's clinical presentations and laboratory/imaging studies I suspect ***. Reevaluation of the patient after these medications showed that the patient {resolved/improved/worsened:23923::"improved"}.  I have reviewed the patient home medicines and have made adjustments as needed.  Cardiac monitoring/EKG: The patient was maintained on a cardiac monitor.  I personally reviewed and interpreted the cardiac monitor which showed an underlying rhythm of: sinus rhythm.  Additional history obtained: External records from outside source obtained and reviewed including: Chart review including previous notes, labs, imaging.  Consultations obtained: I requested consultation with Dr. ***, and discussed lab and imaging findings as well as pertinent plan.  He/she ***.  Disposition Continued outpatient therapy. Follow-up  with PCP*** recommended for reevaluation of symptoms. Treatment plan discussed with patient.  Pt acknowledged understanding was agreeable to the plan. Worrisome signs and symptoms were discussed with patient, and patient acknowledged understanding to return to the ED if they noticed these signs and symptoms. Patient was stable upon discharge.   This chart was dictated using voice recognition software.  Despite best efforts to proofread,  errors can occur which can change the documentation meaning.    {Document critical care time when appropriate:1} {Document review of labs and clinical decision tools ie heart score, Chads2Vasc2 etc:1}  {Document your independent review of radiology images, and any outside records:1} {Document your discussion with family members, caretakers, and with consultants:1} {Document social determinants of health affecting pt's care:1} {Document your decision making why or why not admission,  treatments were needed:1} Final Clinical Impression(s) / ED Diagnoses Final diagnoses:  None    Rx / DC Orders ED Discharge Orders     None

## 2023-01-13 NOTE — ED Triage Notes (Signed)
Pt states that she has been having ongoing DOE and orthopnea. Endorses mid sternal non-radiating CP. Pt states "my throat gets dry when I'm laying down and I can't swallow". Denies leg swelling. No hx of CHF per pt.

## 2023-01-17 ENCOUNTER — Emergency Department (HOSPITAL_COMMUNITY): Payer: Medicaid Other

## 2023-01-17 ENCOUNTER — Encounter (HOSPITAL_COMMUNITY): Payer: Self-pay

## 2023-01-17 ENCOUNTER — Emergency Department (HOSPITAL_COMMUNITY)
Admission: EM | Admit: 2023-01-17 | Discharge: 2023-01-17 | Disposition: A | Discharge status: Home / Self Care | Payer: Medicaid Other | Attending: Emergency Medicine | Admitting: Emergency Medicine

## 2023-01-17 DIAGNOSIS — Z79899 Other long term (current) drug therapy: Secondary | ICD-10-CM | POA: Insufficient documentation

## 2023-01-17 DIAGNOSIS — I10 Essential (primary) hypertension: Secondary | ICD-10-CM | POA: Insufficient documentation

## 2023-01-17 DIAGNOSIS — R1310 Dysphagia, unspecified: Secondary | ICD-10-CM | POA: Diagnosis present

## 2023-01-17 LAB — CBC WITH DIFFERENTIAL/PLATELET
Abs Immature Granulocytes: 0.01 10*3/uL (ref 0.00–0.07)
Basophils Absolute: 0 10*3/uL (ref 0.0–0.1)
Basophils Relative: 1 %
Eosinophils Absolute: 0.1 10*3/uL (ref 0.0–0.5)
Eosinophils Relative: 2 %
HCT: 38.1 % (ref 36.0–46.0)
Hemoglobin: 12.3 g/dL (ref 12.0–15.0)
Immature Granulocytes: 0 %
Lymphocytes Relative: 30 %
Lymphs Abs: 1.4 10*3/uL (ref 0.7–4.0)
MCH: 25.7 pg — ABNORMAL LOW (ref 26.0–34.0)
MCHC: 32.3 g/dL (ref 30.0–36.0)
MCV: 79.5 fL — ABNORMAL LOW (ref 80.0–100.0)
Monocytes Absolute: 0.3 10*3/uL (ref 0.1–1.0)
Monocytes Relative: 7 %
Neutro Abs: 2.7 10*3/uL (ref 1.7–7.7)
Neutrophils Relative %: 60 %
Platelets: 257 10*3/uL (ref 150–400)
RBC: 4.79 MIL/uL (ref 3.87–5.11)
RDW: 17.1 % — ABNORMAL HIGH (ref 11.5–15.5)
WBC: 4.5 10*3/uL (ref 4.0–10.5)
nRBC: 0 % (ref 0.0–0.2)

## 2023-01-17 LAB — COMPREHENSIVE METABOLIC PANEL
ALT: 23 U/L (ref 0–44)
AST: 19 U/L (ref 15–41)
Albumin: 3.8 g/dL (ref 3.5–5.0)
Alkaline Phosphatase: 35 U/L — ABNORMAL LOW (ref 38–126)
Anion gap: 7 (ref 5–15)
BUN: 9 mg/dL (ref 6–20)
CO2: 28 mmol/L (ref 22–32)
Calcium: 9 mg/dL (ref 8.9–10.3)
Chloride: 103 mmol/L (ref 98–111)
Creatinine, Ser: 0.84 mg/dL (ref 0.44–1.00)
GFR, Estimated: >60 mL/min (ref >60)
Glucose, Bld: 113 mg/dL — ABNORMAL HIGH (ref 70–99)
Potassium: 3.1 mmol/L — ABNORMAL LOW (ref 3.5–5.1)
Sodium: 138 mmol/L (ref 135–145)
Total Bilirubin: 1.3 mg/dL — ABNORMAL HIGH (ref 0.3–1.2)
Total Protein: 6.9 g/dL (ref 6.5–8.1)

## 2023-01-17 LAB — TROPONIN I (HIGH SENSITIVITY)
Troponin I (High Sensitivity): 3 ng/L (ref <18)
Troponin I (High Sensitivity): 3 ng/L (ref <18)

## 2023-01-17 LAB — LIPASE, BLOOD: Lipase: 28 U/L (ref 11–51)

## 2023-01-17 MED ORDER — POTASSIUM CHLORIDE CRYS ER 20 MEQ PO TBCR: 40.0000 meq | EXTENDED_RELEASE_TABLET | Freq: Once | ORAL | Status: DC

## 2023-01-17 MED ORDER — POTASSIUM CHLORIDE 20 MEQ PO PACK
40.0000 meq | PACK | Freq: Once | ORAL | Status: AC
  Administered 2023-01-17: 40 meq via ORAL
  Filled 2023-01-17: qty 2

## 2023-01-17 MED ORDER — LORAZEPAM 1 MG PO TABS
1.0000 mg | ORAL_TABLET | Freq: Once | ORAL | Status: AC
  Administered 2023-01-17: 1 mg via ORAL
  Filled 2023-01-17: qty 1

## 2023-01-17 NOTE — ED Notes (Signed)
Patient passes po challenge

## 2023-01-17 NOTE — ED Provider Notes (Signed)
[place] EMERGENCY DEPARTMENT AT [facility] Provider Note   CSN: [id number] Arrival date & time: 01/17/23  0440     History  Chief Complaint  Patient presents with   Foreign Body    Lydia Gross is a 47 y.o. female.  Patient with a history of hyper tension, acid reflux, obesity, anxiety presenting with foreign body sensation in her throat and difficulty swallowing pills.  States symptoms have been going on for about 10 days.  She has had multiple evaluations for this since March 23 and most emergency room with her PCP.  She is frustrated that she has not gotten the follow-up she has been recommended.  She comes in today because she is still having foreign body sensation when she takes her medications.  She did not take her medications for several days and felt improved but after taking her medications again last night she feels a foreign body sensation in her throat and difficulty breathing and swallowing.  Symptoms are worse when she tries to lie down.  States symptoms started about 10 days ago when she took all her medications at once and she has tried to take them individually since.  She has had constant pain across her chest and her anterior throat associated with some shortness of breath.  She has not had any vomiting.  She has not had any cough or fever.  Feels like she has trouble breathing and trouble swallowing when she tries to lie down.  She has been seen by her PCP multiple times as well as emergency room multiple times had CT scans and x-rays.  CT scans were unrevealing for pulmonary embolism or foreign body in her throat.  She was referred to gastroenterology but is frustrated because no one has called her back.  She is also referred for a sleep study.  She has no known history of sleep apnea. She has no drooling or difficulty handling her secretions.  She states she is able to eat and drink without difficulty but just has problems with pills.  The history is  provided by the patient.  Foreign Body Associated symptoms: no abdominal pain, no congestion, no cough, no nausea, no rhinorrhea and no vomiting        Home Medications Prior to Admission medications   Medication Sig Start Date End Date Taking? Authorizing Provider  ALPRAZolam Duanne Moron) 0.5 MG tablet Take 0.5 mg by mouth 2 (two) times daily as needed. 04/22/22   [provider]  atorvastatin (LIPITOR) 20 MG tablet Take by mouth. 02/14/22   [provider]  baclofen (LIORESAL) 10 MG tablet Take by mouth. 01/26/21   [provider]  DULoxetine (CYMBALTA) 60 MG capsule Take 60 mg by mouth daily. 04/11/22   [provider]  famotidine (PEPCID) 20 MG tablet Take by mouth. 10/05/20   [provider]  famotidine (PEPCID) 20 MG tablet Take 1 tablet (20 mg total) by mouth 2 (two) times daily. 01/13/23   Rex Kras, PA  hydrochlorothiazide (HYDRODIURIL) 25 MG tablet Take 25 mg by mouth every morning.     [provider]  IRON PO Take 1 tablet by mouth every morning.     [provider]  olmesartan (BENICAR) 20 MG tablet Take 20 mg by mouth daily. 02/27/22   [provider]  pantoprazole (PROTONIX) 40 MG tablet Take 40 mg by mouth daily. 04/17/22   [provider]  Prenatal Vit-Fe Fumarate-FA (MULTIVITAMIN-PRENATAL) 27-0.8 MG TABS tablet Take 1 tablet by  mouth daily at 12 noon.    [provider]      Allergies    Patient has no known allergies.    Review of Systems   Review of Systems  Constitutional:  Positive for activity change, appetite change and fatigue. Negative for fever.  HENT:  Negative for congestion, dental problem and rhinorrhea.   Respiratory:  Negative for cough, chest tightness and shortness of breath.   Cardiovascular:  Negative for chest pain.  Gastrointestinal:  Negative for abdominal pain, nausea and vomiting.  Genitourinary:  Negative for dysuria and hematuria.  Musculoskeletal:  Negative for  arthralgias and myalgias.  Skin:  Negative for rash.  Neurological:  Negative for dizziness, weakness, light-headedness and headaches.    Physical Exam Updated Vital Signs BP (!) 181/92 (BP Location: Right Arm)   Pulse 85   Temp 97.7 F (36.5 C) (Oral)   Resp 18   LMP 12/30/2022   SpO2 99%  Physical Exam Vitals and nursing note reviewed.  Constitutional:      General: She is not in acute distress.    Appearance: She is well-developed.     Comments: Anxious appearing, tearful  HENT:     Head: Normocephalic and atraumatic.     Mouth/Throat:     Pharynx: No oropharyngeal exudate.     Comments: Uvula midline, controlling secretions, no foreign body seen, normal voice, no stridor Eyes:     Conjunctiva/sclera: Conjunctivae normal.     Pupils: Pupils are equal, round, and reactive to light.  Neck:     Comments: No meningismus. Cardiovascular:     Rate and Rhythm: Normal rate and regular rhythm.     Heart sounds: Normal heart sounds. No murmur heard. Pulmonary:     Effort: Pulmonary effort is normal. No respiratory distress.     Breath sounds: Normal breath sounds.  Chest:     Chest wall: No tenderness.  Abdominal:     Palpations: Abdomen is soft.     Tenderness: There is no abdominal tenderness. There is no guarding or rebound.  Musculoskeletal:        General: No tenderness. Normal range of motion.     Cervical back: Normal range of motion and neck supple.  Skin:    General: Skin is warm.     Capillary Refill: Capillary refill takes less than 2 seconds.  Neurological:     Mental Status: She is alert and oriented to person, place, and time. Mental status is at baseline.     Cranial Nerves: No cranial nerve deficit.     Motor: No abnormal muscle tone.     Coordination: Coordination normal.     Comments:  5/5 strength throughout. CN 2-12 intact.Equal grip strength.   Psychiatric:        Behavior: Behavior normal.     ED Results / Procedures / Treatments   Labs (all  labs ordered are listed, but only abnormal results are displayed) Labs Reviewed  CBC WITH DIFFERENTIAL/PLATELET - Abnormal; Notable for the following components:      Result Value   MCV 79.5 (*)    MCH 25.7 (*)    RDW 17.1 (*)    All other components within normal limits  COMPREHENSIVE METABOLIC PANEL - Abnormal; Notable for the following components:   Potassium 3.1 (*)    Glucose, Bld 113 (*)    Alkaline Phosphatase 35 (*)    Total Bilirubin 1.3 (*)    All other components within normal limits  LIPASE, BLOOD  TROPONIN I (HIGH SENSITIVITY)  TROPONIN I (HIGH SENSITIVITY)    EKG EKG Interpretation  Date/Time:  Thursday January 17 2023 05:23:52 EDT Ventricular Rate:  60 PR Interval:  179 QRS Duration: 97 QT Interval:  447 QTC Calculation: 447 R Axis:   14 Text Interpretation: Sinus arrhythmia Abnormal R-wave progression, early transition Nonspecific T wave abnormality nonspecific T wave changes III and v3 Confirmed by Ezequiel Essex [phone]) on 01/17/2023 5:33:39 AM  Radiology DG Neck Soft Tissue  Result Date: 01/17/2023 CLINICAL DATA:  Foreign body sensation EXAM: NECK SOFT TISSUES - 1+ VIEW COMPARISON:  None Available. FINDINGS: There is no evidence of retropharyngeal soft tissue swelling or epiglottic enlargement. The cervical airway is unremarkable and no radio-opaque foreign body identified. IMPRESSION: Negative. No radiopaque foreign body. Electronically Signed   By: Jacqulynn Cadet M.D.   On: 01/17/2023 06:03   DG Chest 2 View  Result Date: 01/17/2023 CLINICAL DATA:  47 year old female with sensation of foreign body stuck in the throat. EXAM: CHEST - 2 VIEW COMPARISON:  Chest x-ray 01/13/2023. FINDINGS: Lung volumes are normal. No consolidative airspace disease. No pleural effusions. No pneumothorax. No pulmonary nodule or mass noted. Pulmonary vasculature and the cardiomediastinal silhouette are within normal limits. IMPRESSION: No radiographic evidence of acute  cardiopulmonary disease. Electronically Signed   By: Vinnie Langton M.D.   On: 01/17/2023 06:03    Procedures Procedures    Medications Ordered in ED Medications  LORazepam (ATIVAN) tablet 1 mg (has no administration in time range)    ED Course/ Medical Decision Making/ A&P                             Medical Decision Making Amount and/or Complexity of Data Reviewed Labs: ordered. Decision-making details documented in ED Course. Radiology: ordered and independent interpretation performed. Decision-making details documented in ED Course. ECG/medicine tests: ordered and independent interpretation performed. Decision-making details documented in ED Course.  Risk Prescription drug management.   Difficulty swallowing pills for the past 10 days with multiple evaluations for same.  No hypoxia or increased work of breathing.  Lungs are clear.  No difficulty swallowing secretions.  Patient had a negative CT angiogram of her chest on March 23.  Had negative soft tissue neck CT on March 31.  Patient given viscous lidocaine.  She is able to swallow food and liquid without difficulty.  No evidence of esophageal impaction.  Concern for pill esophagitis.  Patient is on a PPI already.  Discussed avoiding alcohol, caffeine, NSAIDs, spicy foods and proceed with clear clear liquid and soft diet.  Patient frustrated and requesting admission for EGD today.  Discussed there is no emergent indication for this.  Messaged Dr. Randel Pigg of [facility] gastroenterology to help ensure patient gets follow-up.  Also some concern for underlying sleep apnea.  Patient states she has been scheduled for a sleep study by her PCP.  X-rays today are negative for foreign body or other acute pathology.  Results reviewed and interpreted by me.  Labs are reassuring.  Troponin negative.  Recent CT angiogram negative for pulmonary embolism.  Recent CT soft tissue neck negative.  Patient feels improved after viscous lidocaine  and p.o. Ativan.  She swallowing without difficulty.  Labs are reassuring.  Troponin negative x 1 is adequate in the setting of constant pain for 1 week.  Discussed with Dr. Randel Pigg gastroenterology who help arrange outpatient follow-up for EGD. Discussed with patient she should also be evaluated  for sleep apnea as is being scheduled by her doctor.  Return to the ED with difficulty breathing, difficulty swallowing, exertional chest pain, shortness of breath, intractable nausea and vomiting or any other concerns.        Final Clinical Impression(s) / ED Diagnoses Final diagnoses:  Dysphagia, unspecified type    Rx / DC Orders ED Discharge Orders     None         Dymon Summerhill, Annie Main, MD 01/17/23 [phone]

## 2023-01-17 NOTE — ED Notes (Signed)
Provided water to patient for fluid challenge

## 2023-01-17 NOTE — ED Triage Notes (Signed)
Pt states that she feels like every time she takes her medication that it gets stuck in her throat.  Able to swallow secretions, has appt with ENT

## 2023-01-17 NOTE — Discharge Instructions (Signed)
As we discussed we think you have pill esophagitis.  No evidence of heart attack or blood clot in the lung.  Take your stomach medication as prescribed.  Avoid alcohol, caffeine, NSAID medications, spicy foods.  The gastroenterology office should call you today to arrange an appointment for an endoscopy.  Return to the ED with difficulty breathing, chest pain, not able to eat or drink, difficulty swallowing, intractable vomiting or other concerns.

## 2023-01-22 ENCOUNTER — Emergency Department (HOSPITAL_COMMUNITY): Payer: Medicaid Other

## 2023-01-22 ENCOUNTER — Other Ambulatory Visit: Payer: Self-pay

## 2023-01-22 ENCOUNTER — Emergency Department (HOSPITAL_COMMUNITY)
Admission: EM | Admit: 2023-01-22 | Discharge: 2023-01-22 | Disposition: A | Discharge status: Home / Self Care | Payer: Medicaid Other | Attending: Emergency Medicine | Admitting: Emergency Medicine

## 2023-01-22 DIAGNOSIS — R519 Headache, unspecified: Secondary | ICD-10-CM

## 2023-01-22 LAB — CBC WITH DIFFERENTIAL/PLATELET
Abs Immature Granulocytes: 0.02 10*3/uL (ref 0.00–0.07)
Basophils Absolute: 0 10*3/uL (ref 0.0–0.1)
Basophils Relative: 1 %
Eosinophils Absolute: 0.1 10*3/uL (ref 0.0–0.5)
Eosinophils Relative: 2 %
HCT: 41.4 % (ref 36.0–46.0)
Hemoglobin: 13.3 g/dL (ref 12.0–15.0)
Immature Granulocytes: 0 %
Lymphocytes Relative: 27 %
Lymphs Abs: 2.2 10*3/uL (ref 0.7–4.0)
MCH: 25.5 pg — ABNORMAL LOW (ref 26.0–34.0)
MCHC: 32.1 g/dL (ref 30.0–36.0)
MCV: 79.3 fL — ABNORMAL LOW (ref 80.0–100.0)
Monocytes Absolute: 0.6 10*3/uL (ref 0.1–1.0)
Monocytes Relative: 7 %
Neutro Abs: 5 10*3/uL (ref 1.7–7.7)
Neutrophils Relative %: 63 %
Platelets: 251 10*3/uL (ref 150–400)
RBC: 5.22 MIL/uL — ABNORMAL HIGH (ref 3.87–5.11)
RDW: 16.6 % — ABNORMAL HIGH (ref 11.5–15.5)
WBC: 7.9 10*3/uL (ref 4.0–10.5)
nRBC: 0 % (ref 0.0–0.2)

## 2023-01-22 LAB — BASIC METABOLIC PANEL
Anion gap: 7 (ref 5–15)
BUN: 11 mg/dL (ref 6–20)
CO2: 26 mmol/L (ref 22–32)
Calcium: 9.2 mg/dL (ref 8.9–10.3)
Chloride: 103 mmol/L (ref 98–111)
Creatinine, Ser: 0.76 mg/dL (ref 0.44–1.00)
GFR, Estimated: >60 mL/min (ref >60)
Glucose, Bld: 100 mg/dL — ABNORMAL HIGH (ref 70–99)
Potassium: 3.1 mmol/L — ABNORMAL LOW (ref 3.5–5.1)
Sodium: 136 mmol/L (ref 135–145)

## 2023-01-22 MED ORDER — POTASSIUM CHLORIDE 20 MEQ PO PACK
40.0000 meq | PACK | Freq: Once | ORAL | Status: AC
  Administered 2023-01-22: 40 meq via ORAL
  Filled 2023-01-22: qty 2

## 2023-01-22 MED ORDER — POTASSIUM CHLORIDE 10 MEQ/100ML IV SOLN
10.0000 meq | Freq: Once | INTRAVENOUS | Status: AC
  Administered 2023-01-22: 10 meq via INTRAVENOUS
  Filled 2023-01-22: qty 100

## 2023-01-22 MED ORDER — LACTATED RINGERS IV BOLUS
2000.0000 mL | Freq: Once | INTRAVENOUS | Status: AC
  Administered 2023-01-22: 2000 mL via INTRAVENOUS

## 2023-01-22 MED ORDER — MAGNESIUM OXIDE -MG SUPPLEMENT 400 (240 MG) MG PO TABS
400.0000 mg | ORAL_TABLET | Freq: Once | ORAL | Status: AC
  Administered 2023-01-22: 400 mg via ORAL
  Filled 2023-01-22: qty 1

## 2023-01-22 MED ORDER — KETOROLAC TROMETHAMINE 15 MG/ML IJ SOLN
15.0000 mg | Freq: Once | INTRAMUSCULAR | Status: AC
  Administered 2023-01-22: 15 mg via INTRAVENOUS
  Filled 2023-01-22: qty 1

## 2023-01-22 NOTE — ED Triage Notes (Signed)
C/o burning to bilateral eyes with headache behind right eye with intermittent spots. Seen in ED 3/31 for same with no improvement.  Pt reports decreased intake due to dysphagia. Denies sob and cp

## 2023-01-22 NOTE — ED Provider Notes (Signed)
[place] EMERGENCY DEPARTMENT AT [facility] Provider [facility]   CSN: [id number] Arrival date & time: 01/22/23  1619     History Chief Complaint  Patient presents with   Headache    HPI Lydia Gross is a 47 y.o. female presenting for weeks of headache.  She is a 47 year old female historically minimal medical history but recently more extensive history.  States this is her fifth visit this week for a variety of symptoms.  She started having dysphagia a few months ago and poor p.o. intake throughout the week this week.  Every time she eats or drinks anything starts having dyspepsia.  She denies fevers chills nausea vomiting syncope or shortness of breath.  She is otherwise ambulatory tolerating p.o. intake at this time. States that she had a headache that returned today and had some visual symptoms though now these are all resolved.  Went to her eye doctor who saw no abnormalities but comes in today for further evaluation management.  Had an extensive wait in the emergency room my first conversation with her was approximately 4 and half hours into her care and she states her symptoms have resolved at this time..   Patient's recorded medical, surgical, social, medication list and allergies were reviewed in the Snapshot window as part of the initial history.   Review of Systems   Review of Systems  Constitutional:  Negative for chills and fever.  HENT:  Negative for ear pain and sore throat.   Eyes:  Positive for visual disturbance. Negative for pain.  Respiratory:  Negative for cough and shortness of breath.   Cardiovascular:  Negative for chest pain and palpitations.  Gastrointestinal:  Negative for abdominal pain and vomiting.  Genitourinary:  Negative for dysuria and hematuria.  Musculoskeletal:  Negative for arthralgias and back pain.  Skin:  Negative for color change and rash.  Neurological:  Positive for headaches. Negative for seizures and syncope.  All other  systems reviewed and are negative.   Physical Exam Updated Vital Signs BP 132/77   Pulse 92   Temp 97.6 F (36.4 C)   Resp 17   LMP 12/30/2022   SpO2 97%  Physical Exam Vitals and nursing note reviewed.  Constitutional:      General: She is not in acute distress.    Appearance: She is well-developed.  HENT:     Head: Normocephalic and atraumatic.  Eyes:     Conjunctiva/sclera: Conjunctivae normal.  Cardiovascular:     Rate and Rhythm: Normal rate and regular rhythm.     Heart sounds: No murmur heard. Pulmonary:     Effort: Pulmonary effort is normal. No respiratory distress.     Breath sounds: Normal breath sounds.  Abdominal:     General: There is no distension.     Palpations: Abdomen is soft.     Tenderness: There is no abdominal tenderness. There is no right CVA tenderness or left CVA tenderness.  Musculoskeletal:        General: No swelling or tenderness. Normal range of motion.     Cervical back: Neck supple.  Skin:    General: Skin is warm and dry.  Neurological:     General: No focal deficit present.     Mental Status: She is alert and oriented to person, place, and time. Mental status is at baseline.     Cranial Nerves: No cranial nerve deficit.      ED Course/ Medical Decision Making/ A&P    Procedures Procedures  Medications Ordered in ED Medications  ketorolac (TORADOL) 15 MG/ML injection 15 mg (has no administration in time range)  lactated ringers bolus 2,000 mL (2,000 mLs Intravenous New Bag/Given 01/22/23 2119)  potassium chloride (KLOR-CON) packet 40 mEq (40 mEq Oral Given 01/22/23 2120)  potassium chloride 10 mEq in 100 mL IVPB (0 mEq Intravenous Stopped 01/22/23 2220)  magnesium oxide (MAG-OX) tablet 400 mg (400 mg Oral Given 01/22/23 2120)   Medical Decision Making:   Lydia Gross is a 47 y.o. female who presented to the ED today with multiple complaints detailed above.    Complete initial physical exam performed, notably the patient  was  hemodynamically stable in no acute distress.  No papilledema on exam.    Reviewed and confirmed nursing documentation for past medical history, family history, social history.    Initial Assessment:   With the patient's presentation of headache, most likely diagnosis is tension type headaches vs atypical migraines. Other diagnoses were considered including (but not limited to) intracranial mass, intracranial hemorrhage, intracranial infection including meningitis vs encephalitis, GCA, trigeminal neuralgia. These are considered less likely due to history of present illness and physical exam findings.    This is most consistent with an acute life/limb threatening illness complicated by underlying chronic conditions.   Timeline and slow onset is not consistent with SAH/ICH   Age and description of pain is not consistent with GCA   Lack of fever,meningismus is not consistent with meningitis/encephalitis   Initial Plan:  Screening labs including CBC and Metabolic panel to evaluate for infectious or metabolic etiology of disease.  CTH to evaluate for structural IC etiology  Objective evaluation as below reviewed   Initial Study Results:   Laboratory  All laboratory results reviewed without evidence of clinically relevant pathology.     EKG EKG was reviewed independently. Rate, rhythm, axis, intervals all examined and without medically relevant abnormality. ST segments without concerns for elevations.    Radiology:  All images reviewed independently. Agree with radiology report at this time.   CT Head Wo Contrast  Final Result      Final Assessment and Plan:   Patient's objective findings are without any acute pathology.  CT head with no acute pathology Symptoms resolved on reassessment.  She does have metabolic derangements which were treated in emergency room.  She needs follow-up with her primary care provider in the outpatient setting she also needs to follow-up with neurology for  headaches, I considered IIH though this seems less likely with no papilledema, interval resolution and description of dry eyes with burning symptoms.  No acute visual changes with concern for intracranial emergency.  Disposition:  I have considered need for hospitalization, however, considering all of the above, I believe this patient is stable for discharge at this time.  Patient/family educated about specific return precautions for given chief complaint and symptoms.  Patient/family educated about follow-up with PCP.     Patient/family expressed understanding of return precautions and need for follow-up. Patient spoken to regarding all imaging and laboratory results and appropriate follow up for these results. All education provided in verbal form with additional information in written form. Time was allowed for answering of patient questions. Patient discharged.    Emergency Department Medication Summary:   Medications  ketorolac (TORADOL) 15 MG/ML injection 15 mg (has no administration in time range)  lactated ringers bolus 2,000 mL (2,000 mLs Intravenous New Bag/Given 01/22/23 2119)  potassium chloride (KLOR-CON) packet 40 mEq (40 mEq Oral Given 01/22/23 2120)  potassium chloride 10 mEq in 100 mL IVPB (0 mEq Intravenous Stopped 01/22/23 2220)  magnesium oxide (MAG-OX) tablet 400 mg (400 mg Oral Given 01/22/23 2120)        Clinical Impression:  1. Nonintractable headache, unspecified chronicity pattern, unspecified headache type      Discharge   Final Clinical Impression(s) / ED Diagnoses Final diagnoses:  Nonintractable headache, unspecified chronicity pattern, unspecified headache type    Rx / DC Orders ED Discharge Orders          Ordered    Ambulatory referral to Neurology       Comments: An appointment is requested in approximately: 1 week   01/22/23 2049              Glyn Ade, MD 01/22/23 2310

## 2023-01-22 NOTE — ED Provider Triage Note (Signed)
Emergency Medicine Provider Triage Evaluation Note  MARGURETE HOEPNER , a 47 y.o. female  was evaluated in triage.  Pt complains of right sided headache with visual disturbances onset this morning. Patient with history of dysphagia, GERD. EGD pending awaiting completion of cardiac stress test on Thursday. Patient is frustrated that she continues to have symptoms, and that no one seems to be taking her seriously. She is unable to eat or take medications..  Review of Systems  Positive: Headache, visual disturbance, shortness of breath Negative: Abdominal pain  Physical Exam  BP (!) 160/97 (BP Location: Left Arm)   Pulse 84   Temp 97.6 F (36.4 C) (Oral)   Resp 18   LMP 12/30/2022   SpO2 99%  Gen:   Awake, no distress   Resp:  Normal effort. Lungs CTA bilaterally MSK:   Moves extremities without difficulty  Other:  Neuro exam grossly normal  Medical Decision Making  Medically screening exam initiated at 4:54 PM.  Appropriate orders placed.  ADELEIGH HOSAKA was informed that the remainder of the evaluation will be completed by another provider, this initial triage assessment does not replace that evaluation, and the importance of remaining in the ED until their evaluation is complete.     Felicie Morn, NP 01/22/23 1712

## 2023-01-23 ENCOUNTER — Telehealth: Payer: Self-pay

## 2023-01-23 NOTE — Telephone Encounter (Signed)
Received referral for Sleep disordered breathing from [place] PA-C at [facility]. Placed in sleep mailbox

## 2023-02-08 ENCOUNTER — Other Ambulatory Visit: Payer: Self-pay

## 2023-02-08 ENCOUNTER — Emergency Department (HOSPITAL_COMMUNITY)
Admission: EM | Admit: 2023-02-08 | Discharge: 2023-02-08 | Disposition: A | Discharge status: Home / Self Care | Payer: Medicaid Other | Attending: Emergency Medicine | Admitting: Emergency Medicine

## 2023-02-08 ENCOUNTER — Emergency Department (HOSPITAL_COMMUNITY): Payer: Medicaid Other

## 2023-02-08 ENCOUNTER — Encounter (HOSPITAL_COMMUNITY): Payer: Self-pay

## 2023-02-08 DIAGNOSIS — R1310 Dysphagia, unspecified: Secondary | ICD-10-CM | POA: Diagnosis present

## 2023-02-08 DIAGNOSIS — R519 Headache, unspecified: Secondary | ICD-10-CM | POA: Insufficient documentation

## 2023-02-08 LAB — CBC WITH DIFFERENTIAL/PLATELET
Abs Immature Granulocytes: 0.01 10*3/uL (ref 0.00–0.07)
Basophils Absolute: 0 10*3/uL (ref 0.0–0.1)
Basophils Relative: 1 %
Eosinophils Absolute: 0.1 10*3/uL (ref 0.0–0.5)
Eosinophils Relative: 2 %
HCT: 37.8 % (ref 36.0–46.0)
Hemoglobin: 12.3 g/dL (ref 12.0–15.0)
Immature Granulocytes: 0 %
Lymphocytes Relative: 32 %
Lymphs Abs: 2.1 10*3/uL (ref 0.7–4.0)
MCH: 25.7 pg — ABNORMAL LOW (ref 26.0–34.0)
MCHC: 32.5 g/dL (ref 30.0–36.0)
MCV: 78.9 fL — ABNORMAL LOW (ref 80.0–100.0)
Monocytes Absolute: 0.5 10*3/uL (ref 0.1–1.0)
Monocytes Relative: 7 %
Neutro Abs: 3.8 10*3/uL (ref 1.7–7.7)
Neutrophils Relative %: 58 %
Platelets: 297 10*3/uL (ref 150–400)
RBC: 4.79 MIL/uL (ref 3.87–5.11)
RDW: 15.5 % (ref 11.5–15.5)
WBC: 6.5 10*3/uL (ref 4.0–10.5)
nRBC: 0 % (ref 0.0–0.2)

## 2023-02-08 LAB — URINALYSIS, ROUTINE W REFLEX MICROSCOPIC
Bilirubin Urine: NEGATIVE
Glucose, UA: NEGATIVE mg/dL
Hgb urine dipstick: NEGATIVE
Ketones, ur: NEGATIVE mg/dL
Leukocytes,Ua: NEGATIVE
Nitrite: NEGATIVE
Protein, ur: NEGATIVE mg/dL
Specific Gravity, Urine: 1.015 (ref 1.005–1.030)
pH: 6 (ref 5.0–8.0)

## 2023-02-08 LAB — PREGNANCY, URINE: Preg Test, Ur: NEGATIVE

## 2023-02-08 LAB — COMPREHENSIVE METABOLIC PANEL
ALT: 123 U/L — ABNORMAL HIGH (ref 0–44)
AST: 67 U/L — ABNORMAL HIGH (ref 15–41)
Albumin: 3.4 g/dL — ABNORMAL LOW (ref 3.5–5.0)
Alkaline Phosphatase: 39 U/L (ref 38–126)
Anion gap: 7 (ref 5–15)
BUN: 11 mg/dL (ref 6–20)
CO2: 27 mmol/L (ref 22–32)
Calcium: 8.8 mg/dL — ABNORMAL LOW (ref 8.9–10.3)
Chloride: 103 mmol/L (ref 98–111)
Creatinine, Ser: 0.89 mg/dL (ref 0.44–1.00)
GFR, Estimated: >60 mL/min (ref >60)
Glucose, Bld: 120 mg/dL — ABNORMAL HIGH (ref 70–99)
Potassium: 3.2 mmol/L — ABNORMAL LOW (ref 3.5–5.1)
Sodium: 137 mmol/L (ref 135–145)
Total Bilirubin: 0.8 mg/dL (ref 0.3–1.2)
Total Protein: 6.8 g/dL (ref 6.5–8.1)

## 2023-02-08 LAB — LIPASE, BLOOD: Lipase: 36 U/L (ref 11–51)

## 2023-02-08 NOTE — ED Provider Notes (Signed)
[place] EMERGENCY DEPARTMENT AT [facility] Provider Note   CSN: [id number] Arrival date & time: 02/08/23  1405     History  Chief Complaint  Patient presents with   Tingling    ROSAMUND NYLAND is a 47 y.o. female.  Patient is a 47 year old female who presents with various complaints.  She states for about the last month or month and a half she has had some difficulty swallowing.  She can swallow liquids but not pills.  She has had some intermittent headaches where she feels like there is some fluid sloshing around in her head.  Sometimes she has pressure behind her eye.  Sometimes she will have some tingling with her headaches down both sides of her face.  These are intermittent headaches and has been going on for over a month.  She has had some intermittent shortness of breath and some cough which is productive of sputum.  She does not have any neurologic deficits.  No numbness or weakness to her extremities.  No vision changes.  No speech deficits.  No difficulty with her balance.  She has been seen multiple times in the ED over the last 4 to 6 weeks.  On chart review, she was seen in March and had a CT chest which was negative for PE.  She is also had a CT soft tissue neck which was negative.  She has had a recent head CT which was negative.  She has been seen by gastroenterology and had a recent EGD.  They were not able to completely finish the procedure as she had some complications but reportedly and per notes, she did not have any abnormal esophageal findings.  She is scheduled to have an esophagram on Monday.  She also has a neurologic appointment on May 15.       Home Medications Prior to Admission medications   Medication Sig Start Date End Date Taking? Authorizing Provider  ALPRAZolam Prudy Feeler) 0.5 MG tablet Take 0.5 mg by mouth 2 (two) times daily as needed. 04/22/22   [provider]  atorvastatin (LIPITOR) 20 MG tablet Take by mouth. 02/14/22   [provider]  baclofen (LIORESAL) 10 MG tablet Take by mouth. 01/26/21   [provider]  DULoxetine (CYMBALTA) 60 MG capsule Take 60 mg by mouth daily. 04/11/22   [provider]  famotidine (PEPCID) 20 MG tablet Take by mouth. 10/05/20   [provider]  famotidine (PEPCID) 20 MG tablet Take 1 tablet (20 mg total) by mouth 2 (two) times daily. 01/13/23   Jeanelle Malling, PA  hydrochlorothiazide (HYDRODIURIL) 25 MG tablet Take 25 mg by mouth every morning.     [provider]  IRON PO Take 1 tablet by mouth every morning.     [provider]  olmesartan (BENICAR) 20 MG tablet Take 20 mg by mouth daily. 02/27/22   [provider]  pantoprazole (PROTONIX) 40 MG tablet Take 40 mg by mouth daily. 04/17/22   [provider]  Prenatal Vit-Fe Fumarate-FA (MULTIVITAMIN-PRENATAL) 27-0.8 MG TABS tablet Take 1 tablet by mouth daily at 12 noon.    [provider]      Allergies    Patient has no known allergies.    Review of Systems   Review of Systems  Constitutional:  Negative for chills, diaphoresis, fatigue and fever.  HENT:  Positive for trouble swallowing. Negative for congestion, rhinorrhea, sneezing and voice change.   Eyes: Negative.  Negative for visual disturbance.  Respiratory:  Positive for cough and shortness of breath. Negative for chest tightness.   Cardiovascular:  Negative for chest pain and leg swelling.  Gastrointestinal:  Negative for abdominal pain, blood in stool, diarrhea, nausea and vomiting.  Genitourinary:  Negative for difficulty urinating, flank pain, frequency and hematuria.  Musculoskeletal:  Negative for arthralgias and back pain.  Skin:  Negative for rash.  Neurological:  Positive for headaches. Negative for dizziness, speech difficulty, weakness and numbness.    Physical Exam Updated Vital Signs BP 137/74   Pulse 79   Temp 97.9 F (36.6 C) (Oral)   Resp 14   LMP 01/31/2023 (Approximate)   SpO2 96%   Physical Exam Constitutional:      Appearance: She is well-developed.  HENT:     Head: Normocephalic and atraumatic.     Mouth/Throat:     Pharynx: No oropharyngeal exudate or posterior oropharyngeal erythema.  Eyes:     Extraocular Movements: Extraocular movements intact.     Pupils: Pupils are equal, round, and reactive to light.  Cardiovascular:     Rate and Rhythm: Normal rate and regular rhythm.     Heart sounds: Normal heart sounds.  Pulmonary:     Effort: Pulmonary effort is normal. No respiratory distress.     Breath sounds: Normal breath sounds. No wheezing or rales.  Chest:     Chest wall: No tenderness.  Abdominal:     General: Bowel sounds are normal.     Palpations: Abdomen is soft.     Tenderness: There is no abdominal tenderness. There is no guarding or rebound.  Musculoskeletal:        General: Normal range of motion.     Cervical back: Normal range of motion and neck supple.  Lymphadenopathy:     Cervical: No cervical adenopathy.  Skin:    General: Skin is warm and dry.     Findings: No rash.  Neurological:     Mental Status: She is alert and oriented to person, place, and time.     Comments: Motor 5/5 all extremities Sensation grossly intact to LT all extremities Finger to Nose intact, no pronator drift CN II-XII grossly intact Gait normal      ED Results / Procedures / Treatments   Labs (all labs ordered are listed, but only abnormal results are displayed) Labs Reviewed  COMPREHENSIVE METABOLIC PANEL - Abnormal; Notable for the following components:      Result Value   Potassium 3.2 (*)    Glucose, Bld 120 (*)    Calcium 8.8 (*)    Albumin 3.4 (*)    AST 67 (*)    ALT 123 (*)    All other components within normal limits  CBC WITH DIFFERENTIAL/PLATELET - Abnormal; Notable for the following components:   MCV 78.9 (*)    MCH 25.7 (*)    All other components within normal limits  LIPASE, BLOOD  PREGNANCY, URINE  URINALYSIS, ROUTINE W REFLEX  MICROSCOPIC    EKG EKG Interpretation  Date/Time:  Friday February 08 2023 17:14:50 EDT Ventricular Rate:  70 PR Interval:  180 QRS Duration: 99 QT Interval:  425 QTC Calculation: 459 R Axis:   -6 Text Interpretation: Sinus rhythm Low voltage, precordial leads Abnormal R-wave progression, early transition since last tracing no significant change Confirmed by Rolan Bucco [phone]) on 02/08/2023 5:19:14 PM  Radiology DG Chest 2 View  Result Date: 02/08/2023 CLINICAL DATA:  Productive sputum.  Cough EXAM: CHEST - 2 VIEW COMPARISON:  X-ray 01/17/2023 and older FINDINGS: No consolidation, pneumothorax or effusion. No edema. Normal cardiopericardial silhouette. Degenerative changes along the spine. Surgical clips in the upper abdomen. IMPRESSION: No acute cardiopulmonary disease Electronically Signed   By: Karen Kays M.D.   On: 02/08/2023 15:40    Procedures Procedures    Medications Ordered in ED Medications - No data to display  ED Course/ Medical Decision Making/ A&P                             Medical Decision Making  Patient is a 47 year old female who presents with multiple complaints, her prominent complaints are intermittent frontal headaches ossea with some tingling in her bilateral face and difficulty swallowing.  She can swallow liquids okay.  This is been going on for several weeks.  She is currently being seen by gastroenterology and has an esophagram study as well for Monday.  She is also being followed by her PCP and has an upcoming neurology appointment.  She does not have any focal neurologic deficits or concerns for an acute stroke.  She has had a recent CT scan of her neck which did not show any masses or infection.  She has had some shortness of breath and had a recent PE study which was negative.  She had a chest x-ray today which was interpreted by me and confirmed by the radiologist to show no evidence of pneumonia.  No pulmonary edema.  No pneumothorax.  She does not  have other symptoms that are concerning for ACS.  Her EKG does not show any ischemic changes.  Her labs are nonconcerning other than her LFTs are mildly elevated and she is being followed by gastroenterology for this.  At this point I had a long discussion with her and advised that she needs to continue working with her specialist.  She was discharged from the ED in good condition.  Return precautions were given.  Final Clinical Impression(s) / ED Diagnoses Final diagnoses:  Frequent headaches  Dysphagia, unspecified type    Rx / DC Orders ED Discharge Orders     None         Rolan Bucco, MD 02/08/23 1821

## 2023-02-08 NOTE — ED Provider Triage Note (Signed)
Emergency Medicine Provider Triage Evaluation Note  Lydia Gross , a 47 y.o. female  was evaluated in triage.  Pt complains of multiple concerns including bilateral neck numbness, dysphagia, headache, productive cough.  Symptoms been present for the past month the patient has been seen multiple times for this.  Patient states she is still able to swallow her blood pressure medications and fluids.  Patient endorsed same symptoms.  Patient does see primary care on the outside.  Patient denied fevers, abdominal pain, shortness of breath, chest pain  Review of Systems  Positive: See HPI Negative: See HPI  Physical Exam  BP 133/67 (BP Location: Left Wrist)   Pulse 85   Temp 97.9 F (36.6 C) (Oral)   Resp 17   LMP 12/30/2022   SpO2 98%  Gen:   Awake, no distress   Resp:  Normal effort  MSK:   Moves extremities without difficulty  Other:    Medical Decision Making  Medically screening exam initiated at 3:08 PM.  Appropriate orders placed.  Lydia Gross was informed that the remainder of the evaluation will be completed by another provider, this initial triage assessment does not replace that evaluation, and the importance of remaining in the ED until their evaluation is complete.  Workup initiated, patient stable this time   Remi Deter 02/08/23 1513

## 2023-02-08 NOTE — Discharge Instructions (Signed)
Follow-up with your primary care doctor, neurologist and gastroenterologist as discussed.  Return to emergency room if you have any worsening symptoms.

## 2023-02-08 NOTE — ED Triage Notes (Addendum)
C/o tingling to face and pressure behind bilateral eyes that is worse upon waking x3 days. Relief with sitting up.  Denies headache, blurred vision, double vision.

## 2023-02-13 ENCOUNTER — Ambulatory Visit (INDEPENDENT_AMBULATORY_CARE_PROVIDER_SITE_OTHER): Payer: Medicaid Other | Admitting: Pulmonary Disease

## 2023-02-13 ENCOUNTER — Encounter: Payer: Self-pay | Admitting: Pulmonary Disease

## 2023-02-13 VITALS — BP 130/80 | HR 90 | Ht 70.0 in | Wt 323.0 lb

## 2023-02-13 DIAGNOSIS — G478 Other sleep disorders: Secondary | ICD-10-CM | POA: Insufficient documentation

## 2023-02-13 DIAGNOSIS — G4719 Other hypersomnia: Secondary | ICD-10-CM | POA: Insufficient documentation

## 2023-02-13 DIAGNOSIS — I10 Essential (primary) hypertension: Secondary | ICD-10-CM | POA: Diagnosis not present

## 2023-02-13 DIAGNOSIS — R0683 Snoring: Secondary | ICD-10-CM

## 2023-02-13 NOTE — Patient Instructions (Signed)
Concern about sleep apnea  We will schedule you for home sleep study We will update you with results as soon as reviewed  Continue weight loss efforts  Follow-up in about 3 months  Living With Sleep Apnea Sleep apnea is a condition in which breathing pauses or becomes shallow during sleep. Sleep apnea is most commonly caused by a collapsed or blocked airway. People with sleep apnea usually snore loudly. They may have times when they gasp and stop breathing for 10 seconds or more during sleep. This may happen many times during the night. The breaks in breathing also interrupt the deep sleep that you need to feel rested. Even if you do not completely wake up from the gaps in breathing, your sleep may not be restful and you feel tired during the day. You may also have a headache in the morning and low energy during the day, and you may feel anxious or depressed. How can sleep apnea affect me? Sleep apnea increases your chances of extreme tiredness during the day (daytime fatigue). It can also increase your risk for health conditions, such as: Heart attack. Stroke. Obesity. Type 2 diabetes. Heart failure. Irregular heartbeat. High blood pressure. If you have daytime fatigue as a result of sleep apnea, you may be more likely to: Perform poorly at school or work. Fall asleep while driving. Have difficulty with attention. Develop depression or anxiety. Have sexual dysfunction. What actions can I take to manage sleep apnea? Sleep apnea treatment  If you were given a device to open your airway while you sleep, use it only as told by your health care provider. You may be given: An oral appliance. This is a custom-made mouthpiece that shifts your lower jaw forward. A continuous positive airway pressure (CPAP) device. This device blows air through a mask when you breathe out (exhale). A nasal expiratory positive airway pressure (EPAP) device. This device has valves that you put into each  nostril. A bi-level positive airway pressure (BIPAP) device. This device blows air through a mask when you breathe in (inhale) and breathe out (exhale). You may need surgery if other treatments do not work for you. Sleep habits Go to sleep and wake up at the same time every day. This helps set your internal clock (circadian rhythm) for sleeping. If you stay up later than usual, such as on weekends, try to get up in the morning within 2 hours of your normal wake time. Try to get at least 7-9 hours of sleep each night. Stop using a computer, tablet, and mobile phone a few hours before bedtime. Do not take long naps during the day. If you nap, limit it to 30 minutes. Have a relaxing bedtime routine. Reading or listening to music may relax you and help you sleep. Use your bedroom only for sleep. Keep your television and computer out of your bedroom. Keep your bedroom cool, dark, and quiet. Use a supportive mattress and pillows. Follow your health care provider's instructions for other changes to sleep habits. Nutrition Do not eat heavy meals in the evening. Do not have caffeine in the later part of the day. The effects of caffeine can last for more than 5 hours. Follow your health care provider's or dietitian's instructions for any diet changes. Lifestyle     Do not drink alcohol before bedtime. Alcohol can cause you to fall asleep at first, but then it can cause you to wake up in the middle of the night and have trouble getting back to  sleep. Do not use any products that contain nicotine or tobacco. These products include cigarettes, chewing tobacco, and vaping devices, such as e-cigarettes. If you need help quitting, ask your health care provider. Medicines Take over-the-counter and prescription medicines only as told by your health care provider. Do not use over-the-counter sleep medicine. You can become dependent on this medicine, and it can make sleep apnea worse. Do not use medicines,  such as sedatives and narcotics, unless told by your health care provider. Activity Exercise on most days, but avoid exercising in the evening. Exercising near bedtime can interfere with sleeping. If possible, spend time outside every day. Natural light helps regulate your circadian rhythm. General information Lose weight if you need to, and maintain a healthy weight. Keep all follow-up visits. This is important. If you are having surgery, make sure to tell your health care provider that you have sleep apnea. You may need to bring your device with you. Where to find more information Learn more about sleep apnea and daytime fatigue from: American Sleep Association: sleepassociation.org National Sleep Foundation: sleepfoundation.org National Heart, Lung, and Blood Institute: [employer] Summary Sleep apnea is a condition in which breathing pauses or becomes shallow during sleep. Sleep apnea can cause daytime fatigue and other serious health conditions. You may need to wear a device while sleeping to help keep your airway open. If you are having surgery, make sure to tell your health care provider that you have sleep apnea. You may need to bring your device with you. Making changes to sleep habits, diet, lifestyle, and activity can help you manage sleep apnea. This information is not intended to replace advice given to you by your health care provider. Make sure you discuss any questions you have with your health care provider. Document Revised: 05/10/2021 Document Reviewed: 09/09/2020 Elsevier Patient Education  2023 [employer].

## 2023-02-13 NOTE — Progress Notes (Signed)
Lydia Gross    [id number]    Sep 08, 1976  Primary Care Physician:Medicine, [facility]  Referring Physician: Medicine, [facility] No address on file  Chief complaint:   Patient with a history of snoring, nonrestorative sleep  HPI:  Concern for sleep apnea with gasping and choking at night Significant snoring nonrestorative and interrupted sleep Usually goes to bed between 8 and 11 PM Takes about 15 minutes to fall asleep  Wakes up many times during the night very significantly Final wake up time about 6 AM  Weight is down about 40 pounds from having swallowing issues, has followed up with GI and other providers  She does have a history of hypertension, hypercholesterolemia No family history of obstructive sleep apnea known to her Admits to dryness of her mouth in the mornings Sometimes has a headache in the morning, rare night sweats  Smoked in the past quit about a month ago was only smoking about 1 to 2 cigarettes a day  No pertinent occupational history  She does get sleepy during the day  Outpatient Encounter Medications as of 02/13/2023  Medication Sig   FLUoxetine (PROZAC) 10 MG capsule Take 10 mg by mouth daily.   hydrochlorothiazide (HYDRODIURIL) 25 MG tablet Take 25 mg by mouth every morning.    omeprazole (PRILOSEC) 40 MG capsule Take by mouth.   atorvastatin (LIPITOR) 20 MG tablet Take by mouth. (Patient not taking: Reported on 02/13/2023)   baclofen (LIORESAL) 10 MG tablet Take by mouth. (Patient not taking: Reported on 02/13/2023)   DULoxetine (CYMBALTA) 60 MG capsule Take 60 mg by mouth daily. (Patient not taking: Reported on 02/13/2023)   famotidine (PEPCID) 20 MG tablet Take by mouth. (Patient not taking: Reported on 02/13/2023)   famotidine (PEPCID) 20 MG tablet Take 1 tablet (20 mg total) by mouth 2 (two) times daily. (Patient not taking: Reported on 02/13/2023)   IRON PO Take 1 tablet by mouth every morning.   (Patient not taking: Reported on 02/13/2023)   olmesartan (BENICAR) 20 MG tablet Take 20 mg by mouth daily. (Patient not taking: Reported on 02/13/2023)   pantoprazole (PROTONIX) 40 MG tablet Take 40 mg by mouth daily. (Patient not taking: Reported on 02/13/2023)   Prenatal Vit-Fe Fumarate-FA (MULTIVITAMIN-PRENATAL) 27-0.8 MG TABS tablet Take 1 tablet by mouth daily at 12 noon. (Patient not taking: Reported on 02/13/2023)   [DISCONTINUED] ALPRAZolam (XANAX) 0.5 MG tablet Take 0.5 mg by mouth 2 (two) times daily as needed.   No facility-administered encounter medications on file as of 02/13/2023.    Allergies as of 02/13/2023   (No Known Allergies)    Past Medical History:  Diagnosis Date   Acid reflux    Anxiety    Chronic neck pain    Gout    Hypertension    Obesity     Past Surgical History:  Procedure Laterality Date   CESAREAN SECTION     CHOLECYSTECTOMY     TUBAL LIGATION      Family History  Problem Relation Age of Onset   Hypertension Mother    Stroke Daughter    Thyroid disease Neg Hx     Social History   Socioeconomic History   Marital status: Single    Spouse name: Not on file   Number of children: Not on file   Years of education: Not on file   Highest education level: Not on file  Occupational History   Not  on file  Tobacco Use   Smoking status: Former    Types: Cigarettes    Passive exposure: Never   Smokeless tobacco: Not on file   Tobacco comments:    Stopped over a month ago   Substance and Sexual Activity   Alcohol use: Yes    Comment: little   Drug use: No   Sexual activity: Not Currently    Birth control/protection: Surgical  Other Topics Concern   Not on file  Social History Narrative   Not on file   Social Determinants of Health   Financial Resource Strain: Not on file  Food Insecurity: Food Insecurity Present (07/09/2022)   Hunger Vital Sign    Worried About Running Out of Food in the Last Year: Sometimes true    Ran Out of Food in the  Last Year: Never true  Transportation Needs: No Transportation Needs (07/09/2022)   PRAPARE - Administrator, Civil Service (Medical): No    Lack of Transportation (Non-Medical): No  Physical Activity: Not on file  Stress: Not on file  Social Connections: Not on file  Intimate Partner Violence: Not on file    Review of Systems  Constitutional:  Positive for fatigue.  Psychiatric/Behavioral:  Positive for sleep disturbance.     Vitals:   02/13/23 0934  BP: 130/80  Pulse: 90  SpO2: 96%     Physical Exam Constitutional:      Appearance: She is obese.  HENT:     Head: Normocephalic.     Nose: Nose normal.     Mouth/Throat:     Comments: Mallampati 3, crowded oropharynx Eyes:     General: No scleral icterus.    Pupils: Pupils are equal, round, and reactive to light.  Cardiovascular:     Rate and Rhythm: Normal rate and regular rhythm.     Heart sounds: No murmur heard.    No friction rub.  Pulmonary:     Effort: No respiratory distress.     Breath sounds: No stridor. No wheezing or rhonchi.  Musculoskeletal:     Cervical back: No rigidity or tenderness.  Neurological:     Mental Status: She is alert.  Psychiatric:        Mood and Affect: Mood normal.       02/13/2023    9:00 AM  Results of the Epworth flowsheet  Sitting and reading 3  Watching TV 3  Sitting, inactive in a public place (e.g. a theatre or a meeting) 0  As a passenger in a car for an hour without a break 1  Lying down to rest in the afternoon when circumstances permit 3  Sitting and talking to someone 0  Sitting quietly after a lunch without alcohol 1  In a car, while stopped for a few minutes in traffic 0  Total score 11   Data Reviewed: No previous sleep study on record  Assessment:  Excessive daytime sleepiness -Likely related to untreated sleep disordered breathing  Moderate to high probability of significant obstructive sleep apnea -Patient will benefit from further  evaluation with a sleep study  Class III obesity -Importance of weight management discussed with the patient  Hypertension -On medications  Cardiac dysrhythmia  History of depression  Pathophysiology of sleep disordered breathing discussed with the patient Treatment options for sleep disordered breathing discussed with patient  Plan/Recommendations: Will schedule patient for a home sleep test  Weight loss efforts encouraged  Tentative follow-up scheduled for about 3 months  Encouraged  to call with any significant concerns   Virl Diamond MD [facility] 02/13/2023, 9:54 AM  CC: Medicine, [facility]*

## 2023-03-02 ENCOUNTER — Emergency Department (HOSPITAL_BASED_OUTPATIENT_CLINIC_OR_DEPARTMENT_OTHER)
Admission: EM | Admit: 2023-03-02 | Discharge: 2023-03-02 | Disposition: A | Discharge status: Home / Self Care | Payer: Medicaid Other | Attending: Emergency Medicine | Admitting: Emergency Medicine

## 2023-03-02 ENCOUNTER — Encounter (HOSPITAL_BASED_OUTPATIENT_CLINIC_OR_DEPARTMENT_OTHER): Payer: Self-pay

## 2023-03-02 ENCOUNTER — Other Ambulatory Visit: Payer: Self-pay

## 2023-03-02 DIAGNOSIS — K029 Dental caries, unspecified: Secondary | ICD-10-CM | POA: Insufficient documentation

## 2023-03-02 DIAGNOSIS — K0889 Other specified disorders of teeth and supporting structures: Secondary | ICD-10-CM | POA: Diagnosis present

## 2023-03-02 MED ORDER — PENICILLIN V POTASSIUM 250 MG/5ML PO SOLR: 500.0000 mg | Freq: Four times a day (QID) | ORAL | 0 refills | Status: AC

## 2023-03-02 NOTE — ED Provider Notes (Signed)
[place] EMERGENCY DEPARTMENT AT [facility] Provider Note   CSN: [id number] Arrival date & time: 03/02/23  4098     History  No chief complaint on file.   Lydia Gross is a 47 y.o. female.  The history is provided by the patient and medical records. No language interpreter was used.  Dental Pain Location:  Lower Lower teeth location:  19/LL 1st molar Quality:  Aching and dull Severity:  Moderate Onset quality:  Gradual Duration:  1 week Timing:  Constant Progression:  Worsening Chronicity:  New Context: dental caries and poor dentition   Context: not recent dental surgery and not trauma   Relieved by:  Nothing Worsened by:  Nothing Ineffective treatments:  None tried Associated symptoms: difficulty swallowing (at baseline) and facial pain   Associated symptoms: no congestion, no drooling, no facial swelling, no fever, no gum swelling, no headaches, no neck pain, no oral bleeding and no oral lesions        Home Medications Prior to Admission medications   Medication Sig Start Date End Date Taking? Authorizing Provider  atorvastatin (LIPITOR) 20 MG tablet Take by mouth. Patient not taking: Reported on 02/13/2023 02/14/22   [provider]  baclofen (LIORESAL) 10 MG tablet Take by mouth. Patient not taking: Reported on 02/13/2023 01/26/21   [provider]  DULoxetine (CYMBALTA) 60 MG capsule Take 60 mg by mouth daily. Patient not taking: Reported on 02/13/2023 04/11/22   [provider]  famotidine (PEPCID) 20 MG tablet Take by mouth. Patient not taking: Reported on 02/13/2023 10/05/20   [provider]  famotidine (PEPCID) 20 MG tablet Take 1 tablet (20 mg total) by mouth 2 (two) times daily. Patient not taking: Reported on 02/13/2023 01/13/23   Jeanelle Malling, PA  FLUoxetine (PROZAC) 10 MG capsule Take 10 mg by mouth daily. 01/10/23   [provider]  hydrochlorothiazide (HYDRODIURIL) 25 MG tablet Take 25 mg by mouth every  morning.     [provider]  IRON PO Take 1 tablet by mouth every morning.  Patient not taking: Reported on 02/13/2023    [provider]  olmesartan (BENICAR) 20 MG tablet Take 20 mg by mouth daily. Patient not taking: Reported on 02/13/2023 02/27/22   [provider]  omeprazole (PRILOSEC) 40 MG capsule Take by mouth. 01/17/23 07/16/23  [provider]  pantoprazole (PROTONIX) 40 MG tablet Take 40 mg by mouth daily. Patient not taking: Reported on 02/13/2023 04/17/22   [provider]  Prenatal Vit-Fe Fumarate-FA (MULTIVITAMIN-PRENATAL) 27-0.8 MG TABS tablet Take 1 tablet by mouth daily at 12 noon. Patient not taking: Reported on 02/13/2023    [provider]      Allergies    Patient has no known allergies.    Review of Systems   Review of Systems  Constitutional:  Negative for chills, fatigue and fever.  HENT:  Negative for congestion, drooling, facial swelling and mouth sores.   Eyes:  Negative for visual disturbance.  Respiratory:  Negative for cough, chest tightness, shortness of breath and wheezing.   Cardiovascular:  Negative for chest pain.  Gastrointestinal:  Negative for abdominal pain.  Genitourinary:  Negative for dysuria.  Musculoskeletal:  Negative for back pain and neck pain.  Skin:  Negative for rash and wound.  Neurological:  Negative for light-headedness and headaches.  All other systems reviewed and are negative.   Physical Exam Updated Vital Signs LMP 01/31/2023 (Approximate)  Physical Exam Vitals and nursing note reviewed.  Constitutional:      General: She is not in acute distress.    Appearance: She is well-developed. She is not ill-appearing, toxic-appearing or diaphoretic.  HENT:     Head: Atraumatic.     Nose: No congestion or rhinorrhea.     Mouth/Throat:     Mouth: Mucous membranes are moist. No lacerations or angioedema.     Dentition: Dental tenderness and dental caries present.     Tongue: No  lesions.     Pharynx: No oropharyngeal exudate or posterior oropharyngeal erythema.     Tonsils: No tonsillar exudate or tonsillar abscesses.      Comments: Cracked left lower molar with tenderness.  No significant erythema or evident abscess amenable to drainage.  No evidence of PTA or RPA on distal exam.  No tonsillar exudate.  Uvula midline.  No stridor.  No palpable findings of Ludwig's angina.  No purulence seen.  Tooth not loose on my exam. Eyes:     Extraocular Movements: Extraocular movements intact.     Conjunctiva/sclera: Conjunctivae normal.     Pupils: Pupils are equal, round, and reactive to light.  Neck:     Vascular: No carotid bruit.  Cardiovascular:     Rate and Rhythm: Normal rate and regular rhythm.     Heart sounds: No murmur heard. Pulmonary:     Effort: Pulmonary effort is normal. No respiratory distress.     Breath sounds: Normal breath sounds. No wheezing, rhonchi or rales.  Chest:     Chest wall: No tenderness.  Abdominal:     General: Abdomen is flat.     Palpations: Abdomen is soft.     Tenderness: There is no abdominal tenderness.  Musculoskeletal:        General: No swelling or tenderness.     Cervical back: Neck supple. No rigidity or tenderness.  Lymphadenopathy:     Cervical: No cervical adenopathy.  Skin:    General: Skin is warm and dry.     Capillary Refill: Capillary refill takes less than 2 seconds.     Findings: No erythema.  Neurological:     Mental Status: She is alert.  Psychiatric:        Mood and Affect: Mood normal.     ED Results / Procedures / Treatments   Labs (all labs ordered are listed, but only abnormal results are displayed) Labs Reviewed - No data to display  EKG None  Radiology No results found.  Procedures Procedures    Medications Ordered in ED Medications - No data to display  ED Course/ Medical Decision Making/ A&P                             Medical Decision Making   Lydia Gross is a 47  y.o. female with a past medical history significant for chronic dysphagia currently being managed and worked up by GI, chronic cervical pain, reflux, hypertension, and gout who presents with left lower dental pain.  According to patient, for the last week or so she has had pain in her left lower jaw near a tooth that is cracked.  She reports she has had a foul taste like she has an infection as she has in the past.  She reports she chronically is difficulty swallowing and it is not any different.  She denies any fevers, chills, congestion, or cough.  Denies any nausea, vomiting, or other physical complaints.  She reports the  left lower jaw pain is bothering her and she tried to get into your dentist on Friday but was closed.  She will see them on Monday but is concerned she needs antibiotics for a dental infection.  She reports she had great tooth cracked many years ago and has not caused problems until this week.  On exam, lungs clear and chest nontender.  Abdomen nontender.  Back and neck nontender.  No tenderness under the jaw concerning for Ludwig's angina.  No evidence of PTA or RPA.  Uvula midline.  No posterior oropharyngeal erythema or tonsillar exudate.  She does have tenderness on her left lower jaw about a cracked left lower tooth.  There is no purulence that I can see and no evidence of large abscess but clinically it does appear like there could be an infection with the tenderness.  Rest of exam unremarkable.  No stridor.  Patient well-appearing.  We agree that given her otherwise well appearance and lack of evidence of deep space neck infection we agreed to hold on labs and CT but do feel is reasonable to start her on some antibiotics until she can see her dentist in several days.  Patient agrees with this plan.  She understands return precautions and follow-up instructions and was discharged in good condition.           Final Clinical Impression(s) / ED Diagnoses Final diagnoses:   Pain, dental    Rx / DC Orders ED Discharge Orders          Ordered    penicillin v potassium (VEETID) 250 MG/5ML solution  4 times daily        03/02/23 0814            Clinical Impression: 1. Pain, dental     Disposition: Discharge  Condition: Good  I have discussed the results, Dx and Tx plan with the pt(& family if present). He/she/they expressed understanding and agree(s) with the plan. Discharge instructions discussed at great length. Strict return precautions discussed and pt &/or family have verbalized understanding of the instructions. No further questions at time of discharge.    New Prescriptions   PENICILLIN V POTASSIUM (VEETID) 250 MG/5ML SOLUTION    Take 10 mLs (500 mg total) by mouth 4 (four) times daily for 7 days.    Follow Up: your dentist ASAP         Daijha Leggio, Canary Brim, MD 03/02/23 [phone]

## 2023-03-02 NOTE — ED Triage Notes (Signed)
"  Having problems with left lower tooth for a few months. Need an antibiotic until I can get in with my dentist because it feels like infection is getting in to my jaw" per pt

## 2023-03-02 NOTE — Discharge Instructions (Signed)
Your history, exam, evaluation today are consistent with an early dental infection causing your aching and pain on the left lower tooth where it is cracked.  I did not see evidence of drainage or a large abscess that would permit bedside drainage in the emergency department nor do I feel your exam is consistent with deep space neck infection such as peritonsillar abscess so we together agreed to hold on extensive labs and imaging today however please start the antibiotics and follow-up with dentistry soon as possible.  If any symptoms change or worsen acutely, please return to the nearest emergency department.

## 2023-03-05 HISTORY — PX: DENTAL RESTORATION/EXTRACTION WITH X-RAY: SHX5796

## 2023-03-11 ENCOUNTER — Encounter (HOSPITAL_COMMUNITY): Payer: Self-pay

## 2023-03-11 ENCOUNTER — Emergency Department (HOSPITAL_COMMUNITY)
Admission: EM | Admit: 2023-03-11 | Discharge: 2023-03-11 | Disposition: A | Discharge status: Home / Self Care | Payer: Medicaid Other | Attending: Emergency Medicine | Admitting: Emergency Medicine

## 2023-03-11 ENCOUNTER — Other Ambulatory Visit: Payer: Self-pay

## 2023-03-11 DIAGNOSIS — J029 Acute pharyngitis, unspecified: Secondary | ICD-10-CM | POA: Diagnosis present

## 2023-03-11 DIAGNOSIS — R09A2 Foreign body sensation, throat: Secondary | ICD-10-CM

## 2023-03-11 DIAGNOSIS — R0989 Other specified symptoms and signs involving the circulatory and respiratory systems: Secondary | ICD-10-CM | POA: Insufficient documentation

## 2023-03-11 MED ORDER — SUCRALFATE 1 G PO TABS: 1.0000 g | ORAL_TABLET | Freq: Three times a day (TID) | ORAL | 0 refills | Status: DC

## 2023-03-11 NOTE — ED Provider Notes (Signed)
[place] EMERGENCY DEPARTMENT AT [facility] Provider Note   CSN: [id number] Arrival date & time: 03/11/23  6578     History  Chief Complaint  Patient presents with   Sore Throat    Lydia Gross is a 47 y.o. female.  47 yo F with a cc of a sensation like something is in her throat. She has had this going on for the past few months.  Has had multiple ED visits, seen GI, had endoscopy that was reportedly unremarkable and had an empiric dilatation.  Patient thinks it has worsened after stopping antibiotics for dental pain.  She is worried she has an infection.  Feels like her eyes are burning.     Sore Throat       Home Medications Prior to Admission medications   Medication Sig Start Date End Date Taking? Authorizing Provider  sucralfate (CARAFATE) 1 g tablet Take 1 tablet (1 g total) by mouth 4 (four) times daily -  with meals and at bedtime for 7 days. 03/11/23 03/18/23 Yes Melene Plan, DO  atorvastatin (LIPITOR) 20 MG tablet Take by mouth. Patient not taking: Reported on 02/13/2023 02/14/22   [provider]  baclofen (LIORESAL) 10 MG tablet Take by mouth. Patient not taking: Reported on 02/13/2023 01/26/21   [provider]  DULoxetine (CYMBALTA) 60 MG capsule Take 60 mg by mouth daily. Patient not taking: Reported on 02/13/2023 04/11/22   [provider]  famotidine (PEPCID) 20 MG tablet Take by mouth. Patient not taking: Reported on 02/13/2023 10/05/20   [provider]  famotidine (PEPCID) 20 MG tablet Take 1 tablet (20 mg total) by mouth 2 (two) times daily. Patient not taking: Reported on 02/13/2023 01/13/23   Jeanelle Malling, PA  FLUoxetine (PROZAC) 10 MG capsule Take 10 mg by mouth daily. 01/10/23   [provider]  hydrochlorothiazide (HYDRODIURIL) 25 MG tablet Take 25 mg by mouth every morning.     [provider]  IRON PO Take 1 tablet by mouth every morning.  Patient not taking: Reported on 02/13/2023    [provider]  olmesartan (BENICAR) 20 MG tablet Take 20 mg by mouth daily. Patient not taking: Reported on 02/13/2023 02/27/22   [provider]  omeprazole (PRILOSEC) 40 MG capsule Take by mouth. 01/17/23 07/16/23  [provider]  pantoprazole (PROTONIX) 40 MG tablet Take 40 mg by mouth daily. Patient not taking: Reported on 02/13/2023 04/17/22   [provider]  Prenatal Vit-Fe Fumarate-FA (MULTIVITAMIN-PRENATAL) 27-0.8 MG TABS tablet Take 1 tablet by mouth daily at 12 noon. Patient not taking: Reported on 02/13/2023    [provider]      Allergies    Patient has no known allergies.    Review of Systems   Review of Systems  Physical Exam Updated Vital Signs BP (!) 162/92 (BP Location: Right Arm)   Pulse 79   Temp 98.5 F (36.9 C) (Oral)   Resp 18   Ht 5\' 10"  (1.778 m)   Wt (!) 142.9 kg   LMP 03/03/2023 (Approximate)   SpO2 99%   BMI 45.20 kg/m  Physical Exam Vitals and nursing note reviewed.  Constitutional:      General: She is not in acute distress.    Appearance: She is well-developed. She is not diaphoretic.     Comments: BMI 45.  Smells of marijuana.   HENT:     Head: Normocephalic and atraumatic.     Mouth/Throat:  Comments: No posterior oropharyngeal edema or erythema.  No tonsillar swelling or exudates.  Uvula midline, tolerating secretions without difficulty.  No cervical adenopathy.  Able to rotate her head without eliciting discomfort.  No edema or erythema adjacent to her reported dental procedure.   Eyes:     Pupils: Pupils are equal, round, and reactive to light.     Comments: Dry eyes bilaterally.  No significant injection.  Fake colored contact lenses.   Cardiovascular:     Rate and Rhythm: Normal rate and regular rhythm.     Heart sounds: No murmur heard.    No friction rub. No gallop.  Pulmonary:     Effort: Pulmonary effort is normal.     Breath sounds: No wheezing or rales.  Abdominal:     General: There is no  distension.     Palpations: Abdomen is soft.     Tenderness: There is no abdominal tenderness.  Musculoskeletal:        General: No tenderness.     Cervical back: Normal range of motion and neck supple.  Skin:    General: Skin is warm and dry.  Neurological:     Mental Status: She is alert and oriented to person, place, and time.  Psychiatric:        Behavior: Behavior normal.     ED Results / Procedures / Treatments   Labs (all labs ordered are listed, but only abnormal results are displayed) Labs Reviewed - No data to display  EKG None  Radiology No results found.  Procedures Procedures    Medications Ordered in ED Medications - No data to display  ED Course/ Medical Decision Making/ A&P                             Medical Decision Making Risk Prescription drug management.   47 yo F with a cc of a sensation of having something in her throat.  This is her 8th ED visit for the same since March.  She has had a CT of the neck, CT chest, endoscopy, multiple troponins, chest xrays, xray of her soft tissue of her neck.     She has also been seen in the [facility] with a CT abdpelvis, stress echo and an Esophagram.   Despite all this an organic cause of her symptoms has not yet been found.   She feels like her symptoms have reoccurred after stopping her antibiotics for dental pain.  Her exam here is benign, no signs of posterior oropharyngeal edema or erythema.  No stridor, clear lung sounds.  Tolerating secretions without difficulty.    She was worried about her eyes burning.  Clinically had dry eye, probably worsened by her contact lens use.  No signs of bacterial conjunctivitis.    I had a long discussion with her about her symptoms.  Encouraged her to follow up with her doctor.  She became upset and demanded testing.  Told me she was not leaving until I figured out what was wrong.  I explained why I did not feel that further ED testing would be helpful.   Encouraged her to follow up with her doctor in the office.   6:10 AM:  I have discussed the diagnosis/risks/treatment options with the patient.  Evaluation and diagnostic testing in the emergency department does not suggest an emergent condition requiring admission or immediate intervention beyond what has been performed at this time.  They will follow  up with PCP. We also discussed returning to the ED immediately if new or worsening sx occur. We discussed the sx which are most concerning (e.g., sudden worsening pain, fever, inability to tolerate by mouth) that necessitate immediate return. Medications administered to the patient during their visit and any new prescriptions provided to the patient are listed below.  Medications given during this visit Medications - No data to display   The patient appears reasonably screen and/or stabilized for discharge and I doubt any other medical condition or other [facility] requiring further screening, evaluation, or treatment in the ED at this time prior to discharge.          Final Clinical Impression(s) / ED Diagnoses Final diagnoses:  Globus sensation    Rx / DC Orders ED Discharge Orders          Ordered    sucralfate (CARAFATE) 1 g tablet  3 times daily with meals & bedtime        03/11/23 0559              Melene Plan, DO 03/11/23 6578

## 2023-03-11 NOTE — Discharge Instructions (Signed)
Follow up with your doctor in the office.  Follow up with GI.  They may want to refer you to another specialist.  Return for worsening or persistent difficulty breathing.

## 2023-03-11 NOTE — ED Triage Notes (Signed)
Pt arrives c/o sore throat/feeling like something is stuck in her throat. States that these symptoms started a couple weeks ago, she was seen and completed a course of antibiotics. Symptoms started going away with the antibiotics, but have returned. Pt states that she is unable to swallow pills. Pt also had an infected L lower molar pulled 6 days ago and is having continued pain to area. Pt states that she couldn't sleep last night due to difficulty breathing when she lays down. Pt also states that she has had rash intermittently.

## 2023-03-11 NOTE — ED Notes (Signed)
Pt concerned with her workup today. MD, charge and this nurse in to speak with pt. Advised pt that Dr Adela Lank felt like in an emergency setting standpoint there was no further workup warranted. Recommended pt to follow up with her specialist and informed pt she could come back for a second opinion or for new/worsening s/s. Upon discharge pt understanding of situation. Ambulatory from ED

## 2023-03-19 ENCOUNTER — Emergency Department (HOSPITAL_BASED_OUTPATIENT_CLINIC_OR_DEPARTMENT_OTHER)
Admission: EM | Admit: 2023-03-19 | Discharge: 2023-03-19 | Disposition: A | Discharge status: Home / Self Care | Payer: Medicaid Other | Attending: Emergency Medicine | Admitting: Emergency Medicine

## 2023-03-19 ENCOUNTER — Encounter (HOSPITAL_BASED_OUTPATIENT_CLINIC_OR_DEPARTMENT_OTHER): Payer: Self-pay

## 2023-03-19 ENCOUNTER — Other Ambulatory Visit: Payer: Self-pay

## 2023-03-19 DIAGNOSIS — I10 Essential (primary) hypertension: Secondary | ICD-10-CM | POA: Diagnosis not present

## 2023-03-19 DIAGNOSIS — R1310 Dysphagia, unspecified: Secondary | ICD-10-CM

## 2023-03-19 LAB — CBC
HCT: 35.2 % — ABNORMAL LOW (ref 36.0–46.0)
Hemoglobin: 11.5 g/dL — ABNORMAL LOW (ref 12.0–15.0)
MCH: 25.4 pg — ABNORMAL LOW (ref 26.0–34.0)
MCHC: 32.7 g/dL (ref 30.0–36.0)
MCV: 77.9 fL — ABNORMAL LOW (ref 80.0–100.0)
Platelets: 277 10*3/uL (ref 150–400)
RBC: 4.52 MIL/uL (ref 3.87–5.11)
RDW: 15.8 % — ABNORMAL HIGH (ref 11.5–15.5)
WBC: 4.9 10*3/uL (ref 4.0–10.5)
nRBC: 0 % (ref 0.0–0.2)

## 2023-03-19 LAB — COMPREHENSIVE METABOLIC PANEL
ALT: 29 U/L (ref 0–44)
AST: 19 U/L (ref 15–41)
Albumin: 3.9 g/dL (ref 3.5–5.0)
Alkaline Phosphatase: 39 U/L (ref 38–126)
Anion gap: 6 (ref 5–15)
BUN: 10 mg/dL (ref 6–20)
CO2: 29 mmol/L (ref 22–32)
Calcium: 9.1 mg/dL (ref 8.9–10.3)
Chloride: 105 mmol/L (ref 98–111)
Creatinine, Ser: 0.83 mg/dL (ref 0.44–1.00)
GFR, Estimated: >60 mL/min (ref >60)
Glucose, Bld: 111 mg/dL — ABNORMAL HIGH (ref 70–99)
Potassium: 3.5 mmol/L (ref 3.5–5.1)
Sodium: 140 mmol/L (ref 135–145)
Total Bilirubin: 0.8 mg/dL (ref 0.3–1.2)
Total Protein: 6.6 g/dL (ref 6.5–8.1)

## 2023-03-19 LAB — HCG, SERUM, QUALITATIVE: Preg, Serum: NEGATIVE

## 2023-03-19 MED ORDER — LIDOCAINE VISCOUS HCL 2 % MT SOLN: 15.0000 mL | Freq: Four times a day (QID) | OROMUCOSAL | 0 refills | Status: DC | PRN

## 2023-03-19 NOTE — ED Notes (Signed)
Reviewed AVS/discharge instruction with patient. Time allotted for and all questions answered. Patient is agreeable for d/c and escorted to ed exit by staff.  

## 2023-03-19 NOTE — ED Triage Notes (Signed)
Patient here POV from Home.  Endorses having Dysphagia, Neck Pain for a few weeks. Symptoms improved once she had a Tooth Pulled but did not subsided fully.   Has some Tingling to Right Facial and Head Area for a few weeks as well. No Known Fevers. No emesis. Nausea began mor recently. All other Symptoms worsened recently as well.   NAD Noted during Triage. A&Ox4. GCS 15. Ambulatory.

## 2023-03-19 NOTE — Discharge Instructions (Addendum)
Please keep your appointment with the ENT tomorrow.  Use the Magic mouthwash by gargling it and swallowing.  You may use this up to 4 times daily.  I recommend soft food diet for the time being.  Specifically I recommend avoiding chicken or steak and with your cutting up into very small pieces.

## 2023-03-19 NOTE — ED Provider Notes (Signed)
[facility] EMERGENCY DEPARTMENT AT [facility] Provider Note   CSN: [id number] Arrival date & time: 03/19/23  1330     History  Chief Complaint  Patient presents with   Dysphagia    Lydia Gross is a 47 y.o. female.  HPI  Patient is a 47 year old female with past medical history significant for thyroid nodule, hypertension, obesity, anxiety, reflux, gout, chronic back pain  She is present emergency room today with complaints of intermittent difficulty swallowing over the past 3 days.  Seems that this occurs intermittently and sometimes she has no problems swallowing since she has trouble initiating swallowing.  She does not have any significant pain but does state some discomfort.  She did have some recent dental work done and was treated with antibiotics for specifically penicillin for an entire course and then had dental extraction that seem to go well and she had resolution of her symptoms sometime after this--all of this occurred several weeks ago--she is seeing an ear nose and throat specialist tomorrow but did have another episode of dysphagia which prompted her to come to the emergency room.  She states she is not having trouble swallowing now.  During that of these episodes that she is drooling or having any difficulty breathing.  No chest pain or difficulty breathing no lightheadedness or dizziness.  No fevers      Home Medications Prior to Admission medications   Medication Sig Start Date End Date Taking? Authorizing Provider  magic mouthwash (lidocaine, diphenhydrAMINE, alum & mag hydroxide) suspension Swish and swallow 15 mLs 4 (four) times daily as needed (Gargle and swallow when you are experiencing symptoms (up to four times daily)). 03/19/23  Yes Mylik Pro S, PA  famotidine (PEPCID) 20 MG tablet Take 1 tablet (20 mg total) by mouth 2 (two) times daily. Patient not taking: Reported on 02/13/2023 01/13/23   Jeanelle Malling, PA  FLUoxetine (PROZAC) 10 MG capsule  Take 10 mg by mouth daily. 01/10/23   [provider]  hydrochlorothiazide (HYDRODIURIL) 25 MG tablet Take 25 mg by mouth every morning.     [provider]  omeprazole (PRILOSEC) 40 MG capsule Take by mouth. 01/17/23 07/16/23  [provider]  sucralfate (CARAFATE) 1 g tablet Take 1 tablet (1 g total) by mouth 4 (four) times daily -  with meals and at bedtime for 7 days. 03/11/23 03/18/23  Melene Plan, DO      Allergies    Patient has no known allergies.    Review of Systems   Review of Systems  Physical Exam Updated Vital Signs BP 133/88 (BP Location: Right Arm)   Pulse 74   Temp 98.3 F (36.8 C) (Oral)   Resp 18   Ht 5\' 10"  (1.778 m)   Wt (!) 142.9 kg   LMP 03/03/2023 (Approximate)   SpO2 100%   BMI 45.20 kg/m  Physical Exam Vitals and nursing note reviewed.  Constitutional:      General: She is not in acute distress.    Comments: Pleasant well-appearing 47 year old.  In no acute distress.  Sitting comfortably in bed.  Able answer questions appropriately follow commands. No increased work of breathing. Speaking in full sentences.   HENT:     Head: Normocephalic and atraumatic.     Nose: Nose normal.     Mouth/Throat:     Mouth: Mucous membranes are moist.     Comments: Some poor dentition specifically a single partially eroded molar in the right lower jaw, multiple teeth  extracted but no other significant worrisome teeth.  No periapical abscesses, full range of motion of tongue, no.  Buccal abscesses.  No fluctuance.  No trismus uvula midline normal phonation no drooling tolerating secretions well and speaking with a normal voice Eyes:     General: No scleral icterus. Cardiovascular:     Rate and Rhythm: Normal rate and regular rhythm.     Pulses: Normal pulses.     Heart sounds: Normal heart sounds.  Pulmonary:     Effort: Pulmonary effort is normal. No respiratory distress.     Breath sounds: No wheezing.  Abdominal:     Palpations: Abdomen is  soft.     Tenderness: There is no abdominal tenderness. There is no guarding or rebound.  Musculoskeletal:     Cervical back: Normal range of motion.     Right lower leg: No edema.     Left lower leg: No edema.  Skin:    General: Skin is warm and dry.     Capillary Refill: Capillary refill takes less than 2 seconds.  Neurological:     Mental Status: She is alert. Mental status is at baseline.  Psychiatric:        Mood and Affect: Mood normal.        Behavior: Behavior normal.     ED Results / Procedures / Treatments   Labs (all labs ordered are listed, but only abnormal results are displayed) Labs Reviewed  COMPREHENSIVE METABOLIC PANEL - Abnormal; Notable for the following components:      Result Value   Glucose, Bld 111 (*)    All other components within normal limits  CBC - Abnormal; Notable for the following components:   Hemoglobin 11.5 (*)    HCT 35.2 (*)    MCV 77.9 (*)    MCH 25.4 (*)    RDW 15.8 (*)    All other components within normal limits  HCG, SERUM, QUALITATIVE    EKG None  Radiology No results found.  Procedures Procedures    Medications Ordered in ED Medications - No data to display  ED Course/ Medical Decision Making/ A&P                             Medical Decision Making Amount and/or Complexity of Data Reviewed Labs: ordered.    Patient is a 47 year old female with past medical history significant for thyroid nodule, hypertension, obesity, anxiety, reflux, gout, chronic back pain  She is present emergency room today with complaints of intermittent difficulty swallowing over the past 3 days.  Seems that this occurs intermittently and sometimes she has no problems swallowing since she has trouble initiating swallowing.  She does not have any significant pain but does state some discomfort.  She did have some recent dental work done and was treated with antibiotics for specifically penicillin for an entire course and then had dental  extraction that seem to go well and she had resolution of her symptoms sometime after this--all of this occurred several weeks ago--she is seeing an ear nose and throat specialist tomorrow but did have another episode of dysphagia which prompted her to come to the emergency room.  She states she is not having trouble swallowing now.  During that of these episodes that she is drooling or having any difficulty breathing.  No chest pain or difficulty breathing no lightheadedness or dizziness.  No fevers   Patient symptoms have now resolved  Physical  exam is unremarkable vital signs normal   She has a urine pregnancy test that was negative her basic labs are normal which were obtained in triage.  I reviewed all of these and I think that she is reasonable to be discharged home at this time.  Will give her Magic mouthwash to use as needed for intermittent episodes and recommend that she keep her appointment tomorrow with the ear nose and throat physician for further evaluation.  Return precautions discussed  Final Clinical Impression(s) / ED Diagnoses Final diagnoses:  Dysphagia, unspecified type    Rx / DC Orders ED Discharge Orders          Ordered    magic mouthwash (lidocaine, diphenhydrAMINE, alum & mag hydroxide) suspension  4 times daily PRN        03/19/23 1624              Gailen Shelter, [place] 03/19/23 1631    Ernie Avena, MD 03/19/23 1808

## 2023-03-25 ENCOUNTER — Encounter (HOSPITAL_BASED_OUTPATIENT_CLINIC_OR_DEPARTMENT_OTHER): Payer: Self-pay

## 2023-03-25 ENCOUNTER — Emergency Department (HOSPITAL_BASED_OUTPATIENT_CLINIC_OR_DEPARTMENT_OTHER)
Admission: EM | Admit: 2023-03-25 | Discharge: 2023-03-25 | Disposition: A | Discharge status: Home / Self Care | Payer: Medicaid Other | Source: Home / Self Care | Attending: Emergency Medicine | Admitting: Emergency Medicine

## 2023-03-25 ENCOUNTER — Other Ambulatory Visit: Payer: Self-pay

## 2023-03-25 ENCOUNTER — Emergency Department (HOSPITAL_COMMUNITY): Payer: Medicaid Other

## 2023-03-25 ENCOUNTER — Emergency Department (HOSPITAL_COMMUNITY)
Admission: EM | Admit: 2023-03-25 | Discharge: 2023-03-25 | Disposition: A | Discharge status: Home / Self Care | Payer: Medicaid Other | Attending: Emergency Medicine | Admitting: Emergency Medicine

## 2023-03-25 DIAGNOSIS — R5383 Other fatigue: Secondary | ICD-10-CM | POA: Insufficient documentation

## 2023-03-25 DIAGNOSIS — R52 Pain, unspecified: Secondary | ICD-10-CM | POA: Insufficient documentation

## 2023-03-25 DIAGNOSIS — M791 Myalgia, unspecified site: Secondary | ICD-10-CM | POA: Insufficient documentation

## 2023-03-25 DIAGNOSIS — R5381 Other malaise: Secondary | ICD-10-CM | POA: Insufficient documentation

## 2023-03-25 DIAGNOSIS — R030 Elevated blood-pressure reading, without diagnosis of hypertension: Secondary | ICD-10-CM | POA: Insufficient documentation

## 2023-03-25 DIAGNOSIS — R519 Headache, unspecified: Secondary | ICD-10-CM | POA: Insufficient documentation

## 2023-03-25 DIAGNOSIS — M79605 Pain in left leg: Secondary | ICD-10-CM

## 2023-03-25 DIAGNOSIS — M25572 Pain in left ankle and joints of left foot: Secondary | ICD-10-CM | POA: Insufficient documentation

## 2023-03-25 DIAGNOSIS — R0602 Shortness of breath: Secondary | ICD-10-CM | POA: Diagnosis present

## 2023-03-25 LAB — BASIC METABOLIC PANEL
Anion gap: 7 (ref 5–15)
BUN: 12 mg/dL (ref 6–20)
CO2: 27 mmol/L (ref 22–32)
Calcium: 8.8 mg/dL — ABNORMAL LOW (ref 8.9–10.3)
Chloride: 103 mmol/L (ref 98–111)
Creatinine, Ser: 0.92 mg/dL (ref 0.44–1.00)
GFR, Estimated: >60 mL/min (ref >60)
Glucose, Bld: 105 mg/dL — ABNORMAL HIGH (ref 70–99)
Potassium: 3.5 mmol/L (ref 3.5–5.1)
Sodium: 137 mmol/L (ref 135–145)

## 2023-03-25 LAB — CBC WITH DIFFERENTIAL/PLATELET
Abs Immature Granulocytes: 0.01 10*3/uL (ref 0.00–0.07)
Basophils Absolute: 0 10*3/uL (ref 0.0–0.1)
Basophils Relative: 1 %
Eosinophils Absolute: 0.1 10*3/uL (ref 0.0–0.5)
Eosinophils Relative: 2 %
HCT: 35.7 % — ABNORMAL LOW (ref 36.0–46.0)
Hemoglobin: 11.4 g/dL — ABNORMAL LOW (ref 12.0–15.0)
Immature Granulocytes: 0 %
Lymphocytes Relative: 27 %
Lymphs Abs: 1.3 10*3/uL (ref 0.7–4.0)
MCH: 25.2 pg — ABNORMAL LOW (ref 26.0–34.0)
MCHC: 31.9 g/dL (ref 30.0–36.0)
MCV: 78.8 fL — ABNORMAL LOW (ref 80.0–100.0)
Monocytes Absolute: 0.4 10*3/uL (ref 0.1–1.0)
Monocytes Relative: 9 %
Neutro Abs: 2.9 10*3/uL (ref 1.7–7.7)
Neutrophils Relative %: 61 %
Platelets: 240 10*3/uL (ref 150–400)
RBC: 4.53 MIL/uL (ref 3.87–5.11)
RDW: 15.5 % (ref 11.5–15.5)
WBC: 4.7 10*3/uL (ref 4.0–10.5)
nRBC: 0 % (ref 0.0–0.2)

## 2023-03-25 LAB — TROPONIN I (HIGH SENSITIVITY): Troponin I (High Sensitivity): 3 ng/L (ref <18)

## 2023-03-25 MED ORDER — FLUTICASONE PROPIONATE 50 MCG/ACT NA SUSP
1.0000 | Freq: Every day | NASAL | Status: DC
  Administered 2023-03-25: 1 via NASAL
  Filled 2023-03-25: qty 16

## 2023-03-25 MED ORDER — NAPROXEN 125 MG/5ML PO SUSP: 250.0000 mg | Freq: Two times a day (BID) | ORAL | 0 refills | Status: DC | PRN

## 2023-03-25 NOTE — ED Provider Notes (Signed)
[place] EMERGENCY DEPARTMENT AT [facility] Provider Note   CSN: [id number] Arrival date & time: 03/25/23  2135     History Chief Complaint  Patient presents with   Ankle Pain    HPI Lydia Gross is a 47 y.o. female presenting for multiple complaints. States that she is here with diffuse musculoskeletal pain, left leg swelling and pain, malaise, muscle aches, headache and fatigue.  She also endorses high blood pressure readings at home. Was seen this morning and presents tonight for repeat evaluation  Patient's recorded medical, surgical, social, medication list and allergies were reviewed in the Snapshot window as part of the initial history.   Review of Systems   Review of Systems  Constitutional:  Positive for fatigue. Negative for chills and fever.  HENT:  Negative for ear pain and sore throat.   Eyes:  Negative for pain and visual disturbance.  Respiratory:  Positive for chest tightness and shortness of breath. Negative for cough.   Cardiovascular:  Positive for chest pain. Negative for palpitations.  Gastrointestinal:  Negative for abdominal pain and vomiting.  Genitourinary:  Negative for dysuria and hematuria.  Musculoskeletal:  Positive for myalgias. Negative for arthralgias and back pain.  Skin:  Negative for color change and rash.  Neurological:  Positive for weakness. Negative for seizures and syncope.  All other systems reviewed and are negative.   Physical Exam Updated Vital Signs BP 109/70 (BP Location: Right Arm)   Pulse 83   Temp 98 F (36.7 C) (Oral)   Resp 18   Ht 5\' 10"  (1.778 m)   Wt (!) 142.9 kg   LMP 03/03/2023 (Approximate)   SpO2 100%   BMI 45.20 kg/m  Physical Exam Vitals and nursing note reviewed.  Constitutional:      General: She is not in acute distress.    Appearance: She is well-developed.  HENT:     Head: Normocephalic and atraumatic.  Eyes:     Conjunctiva/sclera: Conjunctivae normal.  Cardiovascular:      Rate and Rhythm: Normal rate and regular rhythm.     Heart sounds: No murmur heard. Pulmonary:     Effort: Pulmonary effort is normal. No respiratory distress.     Breath sounds: Normal breath sounds.  Abdominal:     General: There is no distension.     Palpations: Abdomen is soft.     Tenderness: There is no abdominal tenderness. There is no right CVA tenderness or left CVA tenderness.  Musculoskeletal:        General: No swelling or tenderness. Normal range of motion.     Cervical back: Neck supple.  Skin:    General: Skin is warm and dry.  Neurological:     General: No focal deficit present.     Mental Status: She is alert and oriented to person, place, and time. Mental status is at baseline.     Cranial Nerves: No cranial nerve deficit.      ED Course/ Medical Decision Making/ A&P    Procedures Procedures   Medications Ordered in ED Medications - No data to display  Medical Decision Making:    BRIDGIT KLIMA is a 47 y.o. female who presented to the ED today with multiple complaints detailed above.     Complete initial physical exam performed, notably the patient  was hemodynamically stable in no acute distress.      Reviewed and confirmed nursing documentation for past medical history, family history, social history.    Initial  Assessment:   With the patient's presentation of leg swelling, most likely diagnosis is musculoskeletal etiology. Other diagnoses were considered including (but not limited to) DVT. These are considered less likely due to history of present illness and physical exam findings.   This is most consistent with an acute life/limb threatening illness complicated by underlying chronic conditions.  Initial Plan:  DVT studies are not available at night.  Offered patient DVT study in a.m. and patient does not want to return for the study.  Patient states that if it cannot be done right now she wants to just leave.  Pretest probability is relatively  low given duration of symptoms, repeat extensive evaluations over the past 6 months.  Empiric anticoagulation likely not indicated. Patient does have multiple specialists as well as primary care appointments that she can follow-up with in the outpatient setting. Patient is stable to follow-up with her primary care provider for outpatient imaging should symptoms be persistent.   Clinical Impression: No diagnosis found.   Data Unavailable   Final Clinical Impression(s) / ED Diagnoses Final diagnoses:  None    Rx / DC Orders ED Discharge Orders     None         Glyn Ade, MD 03/25/23 2316

## 2023-03-25 NOTE — ED Notes (Signed)
Pt was discharged and left without discharge paperwork.Pt ambulated without difficulty and was alert and oriented x 4. Provider aware and reports that he discussed treatment/discharge plan with her.

## 2023-03-25 NOTE — ED Triage Notes (Addendum)
Patient coming to ED for evaluation of swelling and pain to L ankle.  Reports she did a lot of work and walking yesterday and woke up this morning with increased pain. States "I don't think it is sprained or broken.  It feels more like fluid."   Patient has multiple complaints that brought her to ED this morning.  States she is having pain to lymph node on L side of neck, nasal congestion, and body aches.  States she recently had a CT scan for symptoms, but "didn't have a MRI yet."  No reports of fevers.

## 2023-03-25 NOTE — ED Provider Notes (Signed)
[place] EMERGENCY DEPARTMENT AT [facility] Provider Note   CSN: [id number] Arrival date & time: 03/25/23  0351     History  Chief Complaint  Patient presents with   Joint Swelling    Lydia Gross is a 47 y.o. female.  The history is provided by the patient and medical records.  Lydia Gross is a 47 y.o. female who presents to the Emergency Department complaining of shortness of breath.  She presents to the emergency department for evaluation of shortness of breath that has been ongoing intermittently for the last several months.  She feels like she has fluid on her lungs and has difficulty with swallowing.  She is also concerned that she might have a sinus infection because she is not blowing her nose as much.  She also complains of pain at her left lymph node.  She was concerned this morning because when she woke up her left ankle hurt and she thought there was fluid moving inside of it.  But when she started to walk her pain resolved.  Her chest feels like she needs to catch her breath.  She also complains of pain throughout her body.  She has been undergoing extensive outpatient workup with evaluation by ENT, PCP, neurology.  She is currently scheduled for an MRI of her brain.  She has had thyroid nodule identified on thyroid ultrasound and had thyroid panels obtained as an outpatient in late May with borderline thyroid levels.  History of tubal ligation.  Home Medications Prior to Admission medications   Medication Sig Start Date End Date Taking? Authorizing Provider  naproxen (NAPROSYN) 125 MG/5ML suspension Take 10 mLs (250 mg total) by mouth 2 (two) times daily as needed. 03/25/23  Yes Tilden Fossa, MD  famotidine (PEPCID) 20 MG tablet Take 1 tablet (20 mg total) by mouth 2 (two) times daily. Patient not taking: Reported on 02/13/2023 01/13/23   Jeanelle Malling, PA  FLUoxetine (PROZAC) 10 MG capsule Take 10 mg by mouth daily. 01/10/23   [provider]   hydrochlorothiazide (HYDRODIURIL) 25 MG tablet Take 25 mg by mouth every morning.     [provider]  magic mouthwash (lidocaine, diphenhydrAMINE, alum & mag hydroxide) suspension Swish and swallow 15 mLs 4 (four) times daily as needed (Gargle and swallow when you are experiencing symptoms (up to four times daily)). 03/19/23   Gailen Shelter, PA  omeprazole (PRILOSEC) 40 MG capsule Take by mouth. 01/17/23 07/16/23  [provider]  sucralfate (CARAFATE) 1 g tablet Take 1 tablet (1 g total) by mouth 4 (four) times daily -  with meals and at bedtime for 7 days. 03/11/23 03/18/23  Melene Plan, DO      Allergies    Patient has no known allergies.    Review of Systems   Review of Systems  All other systems reviewed and are negative.   Physical Exam Updated Vital Signs BP 132/75   Pulse 72   Temp 98.2 F (36.8 C)   Resp 20   LMP 03/03/2023 (Approximate)   SpO2 100%  Physical Exam Vitals and nursing note reviewed.  Constitutional:      Appearance: She is well-developed.  HENT:     Head: Normocephalic and atraumatic.     Comments: TMs clear bilaterally.  Boggy nasal mucosa.  No erythema or edema in the posterior oropharynx.  Cardiovascular:     Rate and Rhythm: Normal rate and regular rhythm.     Heart sounds: No murmur  heard. Pulmonary:     Effort: Pulmonary effort is normal. No respiratory distress.     Breath sounds: Normal breath sounds.  Abdominal:     Palpations: Abdomen is soft.     Tenderness: There is no abdominal tenderness. There is no guarding or rebound.  Musculoskeletal:        General: No swelling or tenderness.     Cervical back: Neck supple.     Comments: 2+ DP pulses bilaterally.  Skin:    General: Skin is warm and dry.  Neurological:     Mental Status: She is alert and oriented to person, place, and time.     Comments: 5 out of 5 strength in all 4 extremities.  No asymmetry of facial movements.  Psychiatric:        Behavior: Behavior normal.      ED Results / Procedures / Treatments   Labs (all labs ordered are listed, but only abnormal results are displayed) Labs Reviewed  BASIC METABOLIC PANEL - Abnormal; Notable for the following components:      Result Value   Glucose, Bld 105 (*)    Calcium 8.8 (*)    All other components within normal limits  CBC WITH DIFFERENTIAL/PLATELET - Abnormal; Notable for the following components:   Hemoglobin 11.4 (*)    HCT 35.7 (*)    MCV 78.8 (*)    MCH 25.2 (*)    All other components within normal limits  TROPONIN I (HIGH SENSITIVITY)    EKG EKG Interpretation  Date/Time:  Monday March 25 2023 06:19:23 EDT Ventricular Rate:  62 PR Interval:  188 QRS Duration: 98 QT Interval:  437 QTC Calculation: 444 R Axis:   3 Text Interpretation: Sinus rhythm Low voltage, precordial leads Borderline T abnormalities, diffuse leads Confirmed by Tilden Fossa [phone]) on 03/25/2023 6:22:46 AM  Radiology DG Chest 2 View  Result Date: 03/25/2023 CLINICAL DATA:  Chest heaviness EXAM: CHEST - 2 VIEW COMPARISON:  02/08/2023 FINDINGS: Normal heart size and mediastinal contours. No acute infiltrate or edema. No effusion or pneumothorax. No acute osseous findings. IMPRESSION: Negative chest. Electronically Signed   By: Tiburcio Pea M.D.   On: 03/25/2023 06:03    Procedures Procedures    Medications Ordered in ED Medications  fluticasone (FLONASE) 50 MCG/ACT nasal spray 1 spray (1 spray Each Nare Given 03/25/23 6295)    ED Course/ Medical Decision Making/ A&P                             Medical Decision Making Amount and/or Complexity of Data Reviewed Labs: ordered. Radiology: ordered.  Risk Prescription drug management.   Patient here for multiple complaints, primary complaint is body aches and left ankle pain.  She also complains of 3 months of shortness of breath and chest pressure.  She is nontoxic-appearing on evaluation.  No significant soft tissue swelling to bilateral lower  extremities.  No evidence of acute fracture, septic arthritis, gouty arthritis/DVT to the left lower extremity.  She is nontoxic on examination with no respiratory distress and lungs are clear on exam.  Chest x-ray is negative for acute infiltrate or pulmonary edema-images personally reviewed and interpreted, agree with radiologist interpretation.  She also has no focal neurologic deficits.  Current clinical picture is not consistent with CVA, ACS, PE, dissection, DVT, GBS, myasthenic crisis.  Feel patient is stable for discharge home with outpatient follow-up and return precautions.  Will start on Flonase for  possible allergic component to her nasal symptoms as she does have a boggy mucosa.  No evidence of sinusitis.        Final Clinical Impression(s) / ED Diagnoses Final diagnoses:  Acute left ankle pain  Body aches    Rx / DC Orders ED Discharge Orders          Ordered    naproxen (NAPROSYN) 125 MG/5ML suspension  2 times daily PRN        03/25/23 0630              Tilden Fossa, MD 03/25/23 [phone]

## 2023-03-25 NOTE — ED Notes (Signed)
Pt verbalized understanding to discharge instructions. Pt has access to home. Pt dressed appropriately for discharge. Pt ambulated from ed with steady gait.

## 2023-03-25 NOTE — ED Triage Notes (Signed)
Patient here POV from Home.  Endorses Pain and Swelling to Left Ankle. Notes a "Knot" to the top of her foot. Also noted Generalized "Poking" Pain. Was seen in ED this AM and discharged but seeks Re-Evaluation for Same.   NAD Noted during Triage. A&Ox4. GCS 15. Ambulatory.

## 2023-03-27 ENCOUNTER — Emergency Department (HOSPITAL_COMMUNITY): Payer: Medicaid Other

## 2023-03-27 ENCOUNTER — Encounter (HOSPITAL_COMMUNITY): Payer: Self-pay

## 2023-03-27 ENCOUNTER — Emergency Department (HOSPITAL_COMMUNITY)
Admission: EM | Admit: 2023-03-27 | Discharge: 2023-03-27 | Disposition: A | Discharge status: Home / Self Care | Payer: Medicaid Other | Attending: Emergency Medicine | Admitting: Emergency Medicine

## 2023-03-27 ENCOUNTER — Other Ambulatory Visit: Payer: Self-pay

## 2023-03-27 DIAGNOSIS — H539 Unspecified visual disturbance: Secondary | ICD-10-CM

## 2023-03-27 DIAGNOSIS — M542 Cervicalgia: Secondary | ICD-10-CM | POA: Diagnosis not present

## 2023-03-27 DIAGNOSIS — R519 Headache, unspecified: Secondary | ICD-10-CM

## 2023-03-27 DIAGNOSIS — I10 Essential (primary) hypertension: Secondary | ICD-10-CM | POA: Diagnosis not present

## 2023-03-27 DIAGNOSIS — E876 Hypokalemia: Secondary | ICD-10-CM | POA: Diagnosis not present

## 2023-03-27 LAB — I-STAT BETA HCG BLOOD, ED (MC, WL, AP ONLY): I-stat hCG, quantitative: <5 m[IU]/mL (ref <5)

## 2023-03-27 LAB — BASIC METABOLIC PANEL
Anion gap: 13 (ref 5–15)
BUN: 6 mg/dL (ref 6–20)
CO2: 27 mmol/L (ref 22–32)
Calcium: 9.2 mg/dL (ref 8.9–10.3)
Chloride: 100 mmol/L (ref 98–111)
Creatinine, Ser: 0.87 mg/dL (ref 0.44–1.00)
GFR, Estimated: >60 mL/min (ref >60)
Glucose, Bld: 103 mg/dL — ABNORMAL HIGH (ref 70–99)
Potassium: 3.1 mmol/L — ABNORMAL LOW (ref 3.5–5.1)
Sodium: 140 mmol/L (ref 135–145)

## 2023-03-27 LAB — CBC
HCT: 38.7 % (ref 36.0–46.0)
Hemoglobin: 12.4 g/dL (ref 12.0–15.0)
MCH: 25.5 pg — ABNORMAL LOW (ref 26.0–34.0)
MCHC: 32 g/dL (ref 30.0–36.0)
MCV: 79.5 fL — ABNORMAL LOW (ref 80.0–100.0)
Platelets: 273 10*3/uL (ref 150–400)
RBC: 4.87 MIL/uL (ref 3.87–5.11)
RDW: 15.4 % (ref 11.5–15.5)
WBC: 5.7 10*3/uL (ref 4.0–10.5)
nRBC: 0 % (ref 0.0–0.2)

## 2023-03-27 LAB — TSH: TSH: 0.457 u[IU]/mL (ref 0.350–4.500)

## 2023-03-27 LAB — LACTIC ACID, PLASMA: Lactic Acid, Venous: 1 mmol/L (ref 0.5–1.9)

## 2023-03-27 MED ORDER — GADOBUTROL 1 MMOL/ML IV SOLN
10.0000 mL | Freq: Once | INTRAVENOUS | Status: AC | PRN
  Administered 2023-03-27: 10 mL via INTRAVENOUS

## 2023-03-27 MED ORDER — ACETAMINOPHEN 160 MG/5ML PO SOLN
650.0000 mg | Freq: Once | ORAL | Status: AC
  Administered 2023-03-27: 650 mg via ORAL
  Filled 2023-03-27: qty 20.3

## 2023-03-27 MED ORDER — IOHEXOL 350 MG/ML SOLN
75.0000 mL | Freq: Once | INTRAVENOUS | Status: AC | PRN
  Administered 2023-03-27: 75 mL via INTRAVENOUS

## 2023-03-27 MED ORDER — POTASSIUM CHLORIDE 20 MEQ PO PACK
40.0000 meq | PACK | Freq: Once | ORAL | Status: AC
  Administered 2023-03-27: 40 meq via ORAL
  Filled 2023-03-27: qty 2

## 2023-03-27 NOTE — ED Notes (Signed)
To ct

## 2023-03-27 NOTE — ED Provider Notes (Signed)
Signout from Dr. Julieanne Manson.  47 year old female with recent dental surgery here with pain in her neck along with intermittent visual symptoms.  CT neck unremarkable.  Getting MRI brain and cervical spine and eyes.  She has a neurosurgeon that she already follows with along with an ophthalmologist.  Plan is to follow-up on MRIs and disposition per results of testing.  Likely will be able to follow-up outpatient with her treatment team..  Physical Exam  BP 122/61 (BP Location: Right Arm)   Pulse 77   Temp 98.4 F (36.9 C) (Oral)   Resp 18   Ht 5\' 10"  (1.778 m)   Wt (!) 142.9 kg   LMP 03/03/2023 (Approximate)   SpO2 98%   BMI 45.20 kg/m   Physical Exam  Procedures  Procedures  ED Course / MDM    Medical Decision Making Amount and/or Complexity of Data Reviewed Labs: ordered. Radiology: ordered.  Risk Prescription drug management.   Her imaging does not show any acute significant findings.  She was aware of the idiopathic intracranial hypertension.  She has a neurosurgeon to follow-up with regarding her spine and an eye doctor.  Will give her dose of oral potassium here.  Recommended continuing her Tylenol and NSAIDs orally and following up with her treatment specialist.       Terrilee Files, MD 03/28/23 [phone]

## 2023-03-27 NOTE — ED Triage Notes (Signed)
Pt arrives via EMS from home. Pt reports headache and vision changes over the past 4 days. Pt states she is supposed be getting an outpatient MRI but has had some difficulty getting one. Pt AxOx4.

## 2023-03-27 NOTE — Discharge Instructions (Signed)
You are seen in the emergency department for neck pain, spots in your vision.  You had a CAT scan of your neck along with MRI of your brain orbits and cervical spine.  You will need to follow-up with your eye doctor and your spine specialist although there are no acute findings requiring intervention today.  You can continue Tylenol and naproxen as needed for pain.  Return to the emergency department if any worsening or concerning symptoms

## 2023-03-27 NOTE — ED Provider Notes (Signed)
[place] EMERGENCY DEPARTMENT AT [facility] Provider Note   CSN: [id number] Arrival date & time: 03/27/23  1241     History  Chief Complaint  Patient presents with   Headache   Eye Problem    Lydia Gross is a 47 y.o. female.  The history is provided by the patient and medical records. No language interpreter was used.  Headache Pain location:  Frontal Quality:  Dull Radiates to:  Eyes and face Severity currently:  Unable to specify Severity at highest:  Unable to specify Onset quality:  Gradual Duration:  3 weeks Timing:  Constant Progression:  Waxing and waning Chronicity:  New Similar to prior headaches: no   Relieved by:  Nothing Worsened by:  Nothing Ineffective treatments:  None tried Associated symptoms: blurred vision, diarrhea, fatigue, neck pain, numbness, swollen glands and visual change   Associated symptoms: no abdominal pain, no back pain, no congestion, no cough, no dizziness, no ear pain, no fever, no focal weakness, no loss of balance, no myalgias, no nausea, no near-syncope, no neck stiffness, no photophobia, no seizures, no vomiting and no weakness   Eye Problem Location:  Both eyes Associated symptoms: blurred vision, headaches and numbness   Associated symptoms: no itching, no nausea, no photophobia, no vomiting and no weakness        Home Medications Prior to Admission medications   Medication Sig Start Date End Date Taking? Authorizing Provider  famotidine (PEPCID) 20 MG tablet Take 1 tablet (20 mg total) by mouth 2 (two) times daily. Patient not taking: Reported on 02/13/2023 01/13/23   Jeanelle Malling, PA  FLUoxetine (PROZAC) 10 MG capsule Take 10 mg by mouth daily. 01/10/23   [provider]  hydrochlorothiazide (HYDRODIURIL) 25 MG tablet Take 25 mg by mouth every morning.     [provider]  magic mouthwash (lidocaine, diphenhydrAMINE, alum & mag hydroxide) suspension Swish and swallow 15 mLs 4 (four) times daily  as needed (Gargle and swallow when you are experiencing symptoms (up to four times daily)). 03/19/23   Gailen Shelter, PA  naproxen (NAPROSYN) 125 MG/5ML suspension Take 10 mLs (250 mg total) by mouth 2 (two) times daily as needed. 03/25/23   Tilden Fossa, MD  omeprazole (PRILOSEC) 40 MG capsule Take by mouth. 01/17/23 07/16/23  [provider]  sucralfate (CARAFATE) 1 g tablet Take 1 tablet (1 g total) by mouth 4 (four) times daily -  with meals and at bedtime for 7 days. 03/11/23 03/18/23  Melene Plan, DO      Allergies    Patient has no known allergies.    Review of Systems   Review of Systems  Constitutional:  Positive for fatigue. Negative for chills, diaphoresis and fever.  HENT:  Negative for congestion and ear pain.   Eyes:  Positive for blurred vision and visual disturbance. Negative for photophobia and itching.  Respiratory:  Negative for cough, chest tightness, shortness of breath and wheezing.   Cardiovascular:  Negative for chest pain, palpitations and near-syncope.  Gastrointestinal:  Positive for diarrhea. Negative for abdominal distention, abdominal pain, constipation, nausea and vomiting.  Genitourinary:  Negative for dysuria and frequency.  Musculoskeletal:  Positive for neck pain. Negative for back pain, myalgias and neck stiffness.  Skin:  Negative for rash and wound.  Neurological:  Positive for numbness and headaches. Negative for dizziness, focal weakness, seizures, syncope, facial asymmetry, speech difficulty, weakness, light-headedness and loss of balance.  Psychiatric/Behavioral:  Negative for agitation and confusion.  All other systems reviewed and are negative.   Physical Exam Updated Vital Signs BP 122/61 (BP Location: Right Arm)   Pulse 77   Temp 98.4 F (36.9 C) (Oral)   Resp 18   Ht 5\' 10"  (1.778 m)   Wt (!) 142.9 kg   LMP 03/03/2023 (Approximate)   SpO2 98%   BMI 45.20 kg/m  Physical Exam Vitals and nursing note reviewed.  Constitutional:       General: Lydia Gross is not in acute distress.    Appearance: Lydia Gross is well-developed. Lydia Gross is not ill-appearing, toxic-appearing or diaphoretic.  HENT:     Head: Normocephalic and atraumatic.     Mouth/Throat:     Mouth: Mucous membranes are moist.  Eyes:     Conjunctiva/sclera: Conjunctivae normal.  Cardiovascular:     Rate and Rhythm: Normal rate and regular rhythm.     Heart sounds: No murmur heard. Pulmonary:     Effort: Pulmonary effort is normal. No respiratory distress.     Breath sounds: Normal breath sounds.  Abdominal:     Palpations: Abdomen is soft.     Tenderness: There is no abdominal tenderness.  Musculoskeletal:        General: No swelling.     Cervical back: Neck supple.  Skin:    General: Skin is warm and dry.     Capillary Refill: Capillary refill takes less than 2 seconds.  Neurological:     Mental Status: Lydia Gross is alert.  Psychiatric:        Mood and Affect: Mood normal.     ED Results / Procedures / Treatments   Labs (all labs ordered are listed, but only abnormal results are displayed) Labs Reviewed  BASIC METABOLIC PANEL - Abnormal; Notable for the following components:      Result Value   Potassium 3.1 (*)    Glucose, Bld 103 (*)    All other components within normal limits  CBC - Abnormal; Notable for the following components:   MCV 79.5 (*)    MCH 25.5 (*)    All other components within normal limits  LACTIC ACID, PLASMA  TSH  URINALYSIS, ROUTINE W REFLEX MICROSCOPIC  PREGNANCY, URINE  LACTIC ACID, PLASMA  I-STAT BETA HCG BLOOD, ED ([facility], [facility], [facility] ONLY)    EKG None  Radiology CT Soft Tissue Neck W Contrast  Result Date: 03/27/2023 CLINICAL DATA:  Epiglottitis or tonsillitis suspected. Neck pain and swelling after recent dental infection. Difficulty swallowing. EXAM: CT NECK WITH CONTRAST TECHNIQUE: Multidetector CT imaging of the neck was performed using the standard protocol following the bolus administration of intravenous contrast. RADIATION  DOSE REDUCTION: This exam was performed according to the departmental dose-optimization program which includes automated exposure control, adjustment of the mA and/or kV according to patient size and/or use of iterative reconstruction technique. CONTRAST:  75mL OMNIPAQUE IOHEXOL 350 MG/ML SOLN COMPARISON:  01/13/2023 FINDINGS: Pharynx and larynx: No evidence of mucosal or submucosal mass or inflammatory disease. No imaging evidence of tonsillitis or epiglottitis. No parapharyngeal inflammatory changes. Salivary glands: Parotid and submandibular glands are normal. Thyroid: Normal Lymph nodes: No lymphadenopathy on either side of the neck. Vascular: No abnormal vascular finding. Limited intracranial: Normal Visualized orbits: Normal Mastoids and visualized paranasal sinuses: Clear Skeleton: No evidence of active dental or periodontal disease. Recent extraction of left mandibular tooth 20 without apparent complicating feature. Upper chest: Normal Other: None IMPRESSION: No evidence of tonsillitis or epiglottitis. No evidence of active dental or periodontal disease. Recent extraction of  left mandibular tooth 20 without apparent complicating feature. Electronically Signed   By: Paulina Fusi M.D.   On: 03/27/2023 14:41    Procedures Procedures    Medications Ordered in ED Medications  gadobutrol (GADAVIST) 1 MMOL/ML injection 10 mL (has no administration in time range)  iohexol (OMNIPAQUE) 350 MG/ML injection 75 mL (75 mLs Intravenous Contrast Given 03/27/23 1423)    ED Course/ Medical Decision Making/ A&P                             Medical Decision Making Amount and/or Complexity of Data Reviewed Labs: ordered. Radiology: ordered.  Risk Prescription drug management.    RONNETTE ASKELAND is a 47 y.o. female with a past medical history significant for hypertension, anxiety, obesity, gout, previous cholecystectomy, dental extraction for dental infection, cervical spine problem with myelopathy, and  previous headaches who presents with multiple complaints including worsening headache over the last few days, intermittent blurry vision with "kaleidoscope" appearance, and persistent pain and swelling on her left neck make it difficult to swallow.  According to patient, Lydia Gross has problem in her neck and needs a cervical fusion for myelopathy but due to her difficulty swallowing and dysphagia since Lydia Gross has had infection in her neck and jaw, her procedure has been delayed until that gets taken care of.  Lydia Gross reports that Lydia Gross is still having pain in her head and neck and feels like her left neck is still swollen and hurting.  Lydia Gross says that over the last few days Lydia Gross has had worsened headaches and has had intermittent vision changes that is a new problem for her.  Lydia Gross reports that over 17 years ago Lydia Gross had intracranial hypertension but has never had any vision changes.  Lydia Gross orts that several days ago when her vision change began Lydia Gross went to the eye doctor and was told Lydia Gross did not have evidence of extra pressure in her head or eyes on her eye exam so Lydia Gross thought that intracranial hypertension was less likely.  Patient says Lydia Gross is not having leg symptoms but was having intermittent tingling and numbness in both of her hands that comes and goes.  It has been going on for the last few days as well.  Otherwise Lydia Gross denies new fevers or chills and denies any chest pain shortness of breath or cough.  Denies palpitations.  Lydia Gross reports no current nausea or vomiting and denies any constipation diarrhea or urinary changes.  On exam, lungs clear.  Chest nontender.  Abdomen nontender.  Lydia Gross does have tenderness in her left neck but I do not appreciate stridor.  Symmetric smile and speech was clear.  Pupils are symmetric and reactive with normal extraocular movements.  I could not appreciate the back of her eye very well on for endoscopy.  No rash seen on her face.  No focal tenderness on the face.  No nuchal rigidity and Lydia Gross move  her neck around.  Grip strength and sensation in hands at this time although Lydia Gross reports the numbness comes and goes.  Lydia Gross did not have a positive Hoffmann on my exam.  Legs nontender and minimally edematous.  Patient otherwise resting.  Given the patient's report of new headache and intermittent vision changes I am somewhat concerned about things like MS versus complex migraine versus recurrent remote intracranial hypertension versus some other abnormality.  With the pain in her neck from this recent dental extraction and continued neck pain and  swelling I am somewhat concerned we need to rule out continued infection in the neck.  Lydia Gross is very concerned about this.  I also feels like MRI needs to be obtained to rule out MS or some other space-occupying lesion.  Optic neuritis is considered based on her presentation.  As the patient is also having the intermittent bilateral hand numbness and has a known myelopathy, Lydia Gross reports that Lydia Gross was supposed to have MRI of her head and neck but has not been able to get that.  We will get MRI of her brain, orbits, and neck to rule out concerning etiologies today and will also get the CT soft tissue of the neck.  Will get screening labs.  Anticipate reassessment after workup to determine disposition.       Care transferred to oncoming team to wait for results of MRI and reassessment.  Anticipate disposition based on findings.         Final Clinical Impression(s) / ED Diagnoses Final diagnoses:  Nonintractable headache, unspecified chronicity pattern, unspecified headache type  Transient vision disturbance of both eyes  Neck pain    Clinical Impression: 1. Nonintractable headache, unspecified chronicity pattern, unspecified headache type   2. Transient vision disturbance of both eyes   3. Neck pain     Disposition: Care transferred to oncoming team to wait for results of MRI and reassessment.  Anticipate disposition based on findings.  This note was  prepared with assistance of Conservation officer, historic buildings. Occasional wrong-word or sound-a-like substitutions may have occurred due to the inherent limitations of voice recognition software.      Yosselyn Tax, Canary Brim, MD 03/27/23 1601

## 2023-03-27 NOTE — ED Provider Triage Note (Addendum)
Emergency Medicine Provider Triage Evaluation Note  Lydia Gross , a 47 y.o. female  was evaluated in triage.  Pt complains of headache and blurry vision.  Started 4 days ago.  Headache primarily located in the forehead.  Gradual in onset not thunderclap.  States she often sees stars and at times it feels like she is looking "through a kaleidoscope".  Denies dizziness but states she has had issues with keeping her balance in the last month as well.  States her PCP was supposed to order an MRI but "did not do what he was post to do".  Denies chest pain shortness of breath.  Denies fever.  Review of Systems  Positive: See above Negative: See above  Physical Exam  BP 122/61 (BP Location: Right Arm)   Pulse 77   Temp 98.4 F (36.9 C) (Oral)   Resp 18   LMP 03/03/2023 (Approximate)   SpO2 98%  Gen:   Awake, no distress   Resp:  Normal effort  MSK:   Moves extremities without difficulty  Other:    Medical Decision Making  Medically screening exam initiated at 12:49 PM.  Appropriate orders placed.  Lydia Gross was informed that the remainder of the evaluation will be completed by another provider, this initial triage assessment does not replace that evaluation, and the importance of remaining in the ED until their evaluation is complete.  Work up started    Lydia Eagle, PA-C 03/27/23 1255

## 2023-03-27 NOTE — ED Notes (Signed)
Patient returned to room from MRI

## 2023-03-30 ENCOUNTER — Emergency Department (HOSPITAL_BASED_OUTPATIENT_CLINIC_OR_DEPARTMENT_OTHER)
Admission: RE | Admit: 2023-03-30 | Discharge: 2023-03-30 | Disposition: A | Discharge status: Home / Self Care | Payer: Medicaid Other | Source: Ambulatory Visit | Attending: Emergency Medicine | Admitting: Emergency Medicine

## 2023-03-30 ENCOUNTER — Encounter (HOSPITAL_COMMUNITY): Payer: Self-pay | Admitting: Emergency Medicine

## 2023-03-30 ENCOUNTER — Encounter (HOSPITAL_COMMUNITY): Payer: Self-pay

## 2023-03-30 ENCOUNTER — Other Ambulatory Visit: Payer: Self-pay

## 2023-03-30 ENCOUNTER — Emergency Department (HOSPITAL_COMMUNITY)
Admission: EM | Admit: 2023-03-30 | Discharge: 2023-03-30 | Disposition: A | Discharge status: Home / Self Care | Payer: Medicaid Other | Attending: Emergency Medicine | Admitting: Emergency Medicine

## 2023-03-30 ENCOUNTER — Emergency Department (HOSPITAL_COMMUNITY)
Admission: EM | Admit: 2023-03-30 | Discharge: 2023-03-30 | Disposition: A | Discharge status: Home / Self Care | Payer: Medicaid Other | Source: Home / Self Care | Attending: Emergency Medicine | Admitting: Emergency Medicine

## 2023-03-30 ENCOUNTER — Emergency Department (HOSPITAL_COMMUNITY): Payer: Medicaid Other

## 2023-03-30 DIAGNOSIS — M79672 Pain in left foot: Secondary | ICD-10-CM

## 2023-03-30 DIAGNOSIS — R2242 Localized swelling, mass and lump, left lower limb: Secondary | ICD-10-CM

## 2023-03-30 DIAGNOSIS — K0889 Other specified disorders of teeth and supporting structures: Secondary | ICD-10-CM

## 2023-03-30 DIAGNOSIS — M7989 Other specified soft tissue disorders: Secondary | ICD-10-CM | POA: Insufficient documentation

## 2023-03-30 DIAGNOSIS — I10 Essential (primary) hypertension: Secondary | ICD-10-CM | POA: Insufficient documentation

## 2023-03-30 DIAGNOSIS — M79609 Pain in unspecified limb: Secondary | ICD-10-CM

## 2023-03-30 LAB — I-STAT BETA HCG BLOOD, ED (MC, WL, AP ONLY): I-stat hCG, quantitative: <5 m[IU]/mL (ref <5)

## 2023-03-30 MED ORDER — KETOROLAC TROMETHAMINE 30 MG/ML IJ SOLN
30.0000 mg | Freq: Once | INTRAMUSCULAR | Status: AC
  Administered 2023-03-30: 30 mg via INTRAMUSCULAR
  Filled 2023-03-30: qty 1

## 2023-03-30 NOTE — Progress Notes (Signed)
VASCULAR LAB    Left lower extremity venous duplex has been performed.  See CV proc for preliminary results.   Kayshawn Ozburn, RVT 03/30/2023, 1:03 PM

## 2023-03-30 NOTE — Discharge Instructions (Addendum)
As we discussed I suspect that your foot pain is related to some swelling/irritation/inflammation of one of the bones of your left foot (specifically the navicular bone).   Please place ice on your foot intermittently to help cool the area down small swelling.  Tylenol 1000 mg  I have placed an order for an [place] since you are concerned about a DVT (blood clot). I suspect this is not the case. Next step will be following up with a primary care provider.

## 2023-03-30 NOTE — ED Triage Notes (Signed)
Pt arrived POV from home stating she was told to come back for a ultrasound of her leg for swelling but also wants to know if we can give her something for inflammation because she is having a toothache.

## 2023-03-30 NOTE — ED Triage Notes (Signed)
Pt in with c/o knot to top of L foot x a few days. Pt states she noticed that today, the knot has almost disappeared but she felt a pop to the area and is now having L leg pain and sob. Afraid she may have a blood clot to LLE

## 2023-03-30 NOTE — ED Provider Notes (Addendum)
[place] EMERGENCY DEPARTMENT AT [facility] Provider Note   CSN: [id number] Arrival date & time: 03/30/23  0331     History  Chief Complaint  Patient presents with   Knot on L Foot    Lydia Gross is a 47 y.o. female.  HPI  Patient presents emergency room today with complaints of left foot pain at the top of her foot.  She felt that she had a popping sensation in this area 2 days ago and states that she has some swelling on the top of her foot which is now mostly completely resolved.   No traumatic injuries to her foot.  No fevers chills nausea vomiting no pain in her ankle or in the toe joints.    Home Medications Prior to Admission medications   Medication Sig Start Date End Date Taking? Authorizing Provider  famotidine (PEPCID) 20 MG tablet Take 1 tablet (20 mg total) by mouth 2 (two) times daily. Patient not taking: Reported on 02/13/2023 01/13/23   Jeanelle Malling, PA  FLUoxetine (PROZAC) 10 MG capsule Take 10 mg by mouth daily. 01/10/23   [provider]  hydrochlorothiazide (HYDRODIURIL) 25 MG tablet Take 25 mg by mouth every morning.     [provider]  magic mouthwash (lidocaine, diphenhydrAMINE, alum & mag hydroxide) suspension Swish and swallow 15 mLs 4 (four) times daily as needed (Gargle and swallow when you are experiencing symptoms (up to four times daily)). 03/19/23   Gailen Shelter, PA  naproxen (NAPROSYN) 125 MG/5ML suspension Take 10 mLs (250 mg total) by mouth 2 (two) times daily as needed. 03/25/23   Tilden Fossa, MD  omeprazole (PRILOSEC) 40 MG capsule Take by mouth. 01/17/23 07/16/23  [provider]  sucralfate (CARAFATE) 1 g tablet Take 1 tablet (1 g total) by mouth 4 (four) times daily -  with meals and at bedtime for 7 days. 03/11/23 03/18/23  Melene Plan, DO      Allergies    Patient has no known allergies.    Review of Systems   Review of Systems  Physical Exam Updated Vital Signs BP (!) 121/108   Pulse 78    Temp 97.6 F (36.4 C) (Oral)   Resp 18   Wt (!) 142.9 kg   LMP 03/03/2023 (Approximate)   SpO2 96%   BMI 45.20 kg/m  Physical Exam Vitals and nursing note reviewed.  Constitutional:      General: She is not in acute distress.    Appearance: Normal appearance. She is not ill-appearing.  HENT:     Head: Normocephalic and atraumatic.     Mouth/Throat:     Mouth: Mucous membranes are moist.  Eyes:     General: No scleral icterus.       Right eye: No discharge.        Left eye: No discharge.     Conjunctiva/sclera: Conjunctivae normal.  Pulmonary:     Effort: Pulmonary effort is normal.     Breath sounds: No stridor.  Abdominal:     Tenderness: There is no abdominal tenderness.  Musculoskeletal:     Right lower leg: No edema.     Left lower leg: No edema.     Comments: Bilateral feet appear normal, bilateral lower extremities are symmetric with no calf tenderness.  There is no palpable tenderness of either lower extremity specifically there is no calf tenderness or tibial or malleoli or tenderness  Skin:    General: Skin is dry.  Neurological:  Mental Status: She is alert and oriented to person, place, and time. Mental status is at baseline.     ED Results / Procedures / Treatments   Labs (all labs ordered are listed, but only abnormal results are displayed) Labs Reviewed  I-STAT BETA HCG BLOOD, ED ([facility], [facility], [facility] ONLY)    EKG None  Radiology DG Foot Complete Left  Result Date: 03/30/2023 CLINICAL DATA:  47 year old female with history of left foot pain. EXAM: LEFT FOOT - COMPLETE 3+ VIEW COMPARISON:  No priors. FINDINGS: Three views of the left foot demonstrate no acute displaced fracture, subluxation or dislocation. There are enthesophyte extending off the dorsal and plantar aspect of the calcaneus. IMPRESSION: 1. No acute radiographic abnormality of the left foot. Electronically Signed   By: Trudie Reed M.D.   On: 03/30/2023 05:24    Procedures Procedures     Medications Ordered in ED Medications - No data to display  ED Course/ Medical Decision Making/ A&P                             Medical Decision Making Amount and/or Complexity of Data Reviewed Radiology: ordered.   Patient with reassuring physical exam DP PT pulses 3+ and symmetrical normal sensation and movement of toes.  Well-appearing on exam  I reviewed prior images and labs from visits that patient has had over the past few months  I personally viewed the images of the x-ray of left foot that were obtained in triage.  I-STAT hCG negative for pregnancy, no acute fracture on x-ray of foot.  Patient's foot appears normal there is some discomfort with me pushing on the navicular bone.  Patient is particularly concerned that she has a DVT.  I have very low suspicion for this given the location of the patient's pain is in her foot however will order left lower extremity DVT study given that she feels that she is having discomfort in her calf as well.  Patient is not on any statin medication.  No trauma or crush injury.  Soft compartments and is distally neurovascularly intact.  Outpatient workup reasonable after ultrasound is obtained later today.  Final Clinical Impression(s) / ED Diagnoses Final diagnoses:  Left foot pain    Rx / DC Orders ED Discharge Orders          Ordered    LE VENOUS        03/30/23 0612              Gailen Shelter, PA 03/30/23 0635    Gailen Shelter, PA 03/30/23 0981    Nicanor Alcon, April, MD 03/30/23 1914

## 2023-03-30 NOTE — Discharge Instructions (Signed)
Please keep your appointment with your dentist. You can take Naproxen at home as prescribed to help control pain and inflammation at home.

## 2023-03-30 NOTE — ED Provider Notes (Signed)
Emergency Department Provider Note   I have reviewed the triage vital signs and the nursing notes.   HISTORY  Chief Complaint Leg Swelling   HPI Lydia Gross is a 47 y.o. female with PMH reviewed returns to the ED with dental pain. She was seen in the ED last night and returned this AM for DVT US, which was normal. She checked back into the ED for evaluation of dental pain.  No fever. No difficulty opening the mouth or change in voice. She was started on Amoxicillin last week by her PCP and has an appointment with her dentist on Monday. She is hoping to get something today to help decrease inflammation.   Past Medical History:  Diagnosis Date   Acid reflux    Anxiety    Chronic neck pain    Gout    Hypertension    Obesity     Review of Systems  Constitutional: No fever/chills. Cardiovascular: Denies chest pain. Respiratory: Denies shortness of breath. Gastrointestinal: No abdominal pain.  Musculoskeletal: Negative for back pain. Skin: Negative for rash. Neurological: Negative for headaches.  ____________________________________________   PHYSICAL EXAM:  VITAL SIGNS: ED Triage Vitals  Enc Vitals Group     BP 03/30/23 1317 119/77     Pulse Rate 03/30/23 1317 81     Resp 03/30/23 1317 16     Temp 03/30/23 1317 98.5 F (36.9 C)     Temp src --      SpO2 03/30/23 1317 100 %     Weight 03/30/23 1324 (!) 305 lb (138.3 kg)     Height 03/30/23 1324 5\' 10"  (1.778 m)   Constitutional: Alert and oriented. Well appearing and in no acute distress. Mouth/Throat: Mucous membranes are moist.  No trismus. No angioedema. No visible dental abscess.  Neck: No stridor.   Cardiovascular: Good peripheral circulation.  Respiratory: Normal respiratory effort.   Gastrointestinal: No distention.  Musculoskeletal: No gross deformities of extremities. Neurologic:  Normal speech and language.  Skin: No rash  noted.  ____________________________________________  RADIOLOGY  LE VENOUS  Result Date: 03/30/2023  Lower Venous DVT Study Patient Name:  Lydia Gross  Date of Exam:   03/30/2023 Medical Rec #: [id number]           Accession #:    [id number] Date of Birth: Oct 29, 1975            Patient Gender: F Patient Age:   76 years Exam Location:  [facility] Procedure:      VAS [place] LOWER EXTREMITY VENOUS (DVT) Referring Phys: Stevphen Meuse FONDAW --------------------------------------------------------------------------------  Indications: Foot pain and swelling a few days ago, now resolved.  Limitations: Body habitus. Comparison Study: No prior study Performing Technologist: Sherren Kerns RVS  Examination Guidelines: A complete evaluation includes B-mode imaging, spectral Doppler, color Doppler, and power Doppler as needed of all accessible portions of each vessel. Bilateral testing is considered an integral part of a complete examination. Limited examinations for reoccurring indications may be performed as noted. The reflux portion of the exam is performed with the patient in reverse Trendelenburg.  +-----+---------------+---------+-----------+----------+--------------+ RIGHTCompressibilityPhasicitySpontaneityPropertiesThrombus Aging +-----+---------------+---------+-----------+----------+--------------+ CFV  Full           Yes      Yes                                 +-----+---------------+---------+-----------+----------+--------------+ SFJ  Full                                                        +-----+---------------+---------+-----------+----------+--------------+   +---------+---------------+---------+-----------+----------+-------------------+  LEFT     CompressibilityPhasicitySpontaneityPropertiesThrombus Aging      +---------+---------------+---------+-----------+----------+-------------------+ CFV      Full           Yes      Yes                                       +---------+---------------+---------+-----------+----------+-------------------+ SFJ      Full                                                             +---------+---------------+---------+-----------+----------+-------------------+ FV Prox  Full                                                             +---------+---------------+---------+-----------+----------+-------------------+ FV Mid   Full           Yes      Yes                                      +---------+---------------+---------+-----------+----------+-------------------+ FV Distal               Yes      Yes                  patent by color and                                                       Doppler             +---------+---------------+---------+-----------+----------+-------------------+ PFV      Full                                                             +---------+---------------+---------+-----------+----------+-------------------+ POP      Full           Yes      Yes                                      +---------+---------------+---------+-----------+----------+-------------------+ PTV      Full                                                             +---------+---------------+---------+-----------+----------+-------------------+ PERO     Full                                                             +---------+---------------+---------+-----------+----------+-------------------+  Summary: RIGHT: - No evidence of common femoral vein obstruction.  LEFT: - There is no evidence of deep vein thrombosis in the lower extremity.  - No cystic structure found in the popliteal fossa.  *See table(s) above for measurements and observations.    Preliminary    DG Foot Complete Left  Result Date: 03/30/2023 CLINICAL DATA:  46 year old female with history of left foot pain. EXAM: LEFT FOOT - COMPLETE 3+ VIEW COMPARISON:  No priors. FINDINGS: Three views of the left foot  demonstrate no acute displaced fracture, subluxation or dislocation. There are enthesophyte extending off the dorsal and plantar aspect of the calcaneus. IMPRESSION: 1. No acute radiographic abnormality of the left foot. Electronically Signed   By: Trudie Reed M.D.   On: 03/30/2023 05:24    ____________________________________________   PROCEDURES  Procedure(s) performed:   Procedures  None  ____________________________________________   INITIAL IMPRESSION / ASSESSMENT AND PLAN / ED COURSE  Pertinent labs & imaging results that were available during my care of the patient were reviewed by me and considered in my medical decision making (see chart for details).   This patient is Presenting for Evaluation of dental pain, which does require a range of treatment options, and is a complaint that involves a moderate risk of morbidity and mortality.  The Differential Diagnoses include dental carries, abscess, gingivitis, Ludwig's angina, etc.  Critical Interventions-    Medications  ketorolac (TORADOL) 30 MG/ML injection 30 mg (has no administration in time range)   I decided to review pertinent External Data, and in summary patient with DVT US this AM which is negative for DVT.    Medical Decision Making: Summary:  Presents emergency department for the evaluation of dental pain.  She is on antibiotics and based on exam and history I do not see an indication to change antibiotics at this point.  She will follow with her dentist on Monday and continue amoxicillin over the weekend.  She has liquid naproxen at home.  Plan for IM Toradol here to help with symptoms and patient can take the naproxen at home.  She has not tried it thus far.  I do not see an indication for advanced imaging of the neck or face with exceedingly low suspicion for deeper space infection.   Patient's presentation is most consistent with acute, uncomplicated illness.   Disposition:  discharge  ____________________________________________  FINAL CLINICAL IMPRESSION(S) / ED DIAGNOSES  Final diagnoses:  Pain, dental    Note:  This document was prepared using Dragon voice recognition software and may include unintentional dictation errors.  Alona Bene, MD, [facility]    Bijal Siglin, Arlyss Repress, MD 03/30/23 [phone]

## 2023-04-07 ENCOUNTER — Emergency Department (HOSPITAL_COMMUNITY)
Admission: EM | Admit: 2023-04-07 | Discharge: 2023-04-08 | Disposition: A | Discharge status: Home / Self Care | Payer: Medicaid Other | Attending: Emergency Medicine | Admitting: Emergency Medicine

## 2023-04-07 ENCOUNTER — Encounter (HOSPITAL_COMMUNITY): Payer: Self-pay

## 2023-04-07 ENCOUNTER — Other Ambulatory Visit: Payer: Self-pay

## 2023-04-07 DIAGNOSIS — R519 Headache, unspecified: Secondary | ICD-10-CM | POA: Diagnosis not present

## 2023-04-07 DIAGNOSIS — J029 Acute pharyngitis, unspecified: Secondary | ICD-10-CM

## 2023-04-07 DIAGNOSIS — R21 Rash and other nonspecific skin eruption: Secondary | ICD-10-CM | POA: Insufficient documentation

## 2023-04-07 NOTE — ED Triage Notes (Signed)
Pt complaining of breaking out on the arms for the last couple of days, throat feels tight, pain in certain areas of her legs, and has been taking amoxicillin for a tooth infection since Monday.

## 2023-04-08 MED ORDER — DEXAMETHASONE 10 MG/ML FOR PEDIATRIC ORAL USE
10.0000 mg | Freq: Once | INTRAMUSCULAR | Status: AC
  Administered 2023-04-08: 10 mg via ORAL
  Filled 2023-04-08: qty 1

## 2023-04-08 NOTE — ED Provider Notes (Addendum)
[place] EMERGENCY DEPARTMENT AT [facility] Provider Note   CSN: [id number] Arrival date & time: 04/07/23  2116     History  Chief Complaint  Patient presents with   Rash    Lydia Gross is a 47 y.o. female.  47 yo F with a chief complaints of a rash on her arms feeling like she is having pain with swallowing and right-sided facial pain.  She said that she has been struggling with this for a while.  She had had 2 teeth removed and had a root canal performed.  She now thinks that maybe she has a sinus infection.  Since then she is actually seeing ENT who did not think that she had a sinus infection.  She is wondering if her antibiotic should be changed.  She has been able to eat and drink without issue but has difficulty swallowing pills.   Rash      Home Medications Prior to Admission medications   Medication Sig Start Date End Date Taking? Authorizing Provider  famotidine (PEPCID) 20 MG tablet Take 1 tablet (20 mg total) by mouth 2 (two) times daily. Patient not taking: Reported on 02/13/2023 01/13/23   Jeanelle Malling, PA  FLUoxetine (PROZAC) 10 MG capsule Take 10 mg by mouth daily. 01/10/23   [provider]  hydrochlorothiazide (HYDRODIURIL) 25 MG tablet Take 25 mg by mouth every morning.     [provider]  magic mouthwash (lidocaine, diphenhydrAMINE, alum & mag hydroxide) suspension Swish and swallow 15 mLs 4 (four) times daily as needed (Gargle and swallow when you are experiencing symptoms (up to four times daily)). 03/19/23   Gailen Shelter, PA  naproxen (NAPROSYN) 125 MG/5ML suspension Take 10 mLs (250 mg total) by mouth 2 (two) times daily as needed. 03/25/23   Tilden Fossa, MD  omeprazole (PRILOSEC) 40 MG capsule Take by mouth. 01/17/23 07/16/23  [provider]  sucralfate (CARAFATE) 1 g tablet Take 1 tablet (1 g total) by mouth 4 (four) times daily -  with meals and at bedtime for 7 days. 03/11/23 03/18/23  Melene Plan, DO       Allergies    Patient has no known allergies.    Review of Systems   Review of Systems  Skin:  Positive for rash.    Physical Exam Updated Vital Signs BP 121/74 (BP Location: Right Arm)   Pulse 86   Temp 98.2 F (36.8 C) (Oral)   Resp 17   Ht 5\' 10"  (1.778 m)   Wt (!) 138.3 kg   LMP 03/03/2023 (Approximate)   SpO2 99%   BMI 43.76 kg/m  Physical Exam Vitals and nursing note reviewed.  Constitutional:      General: She is not in acute distress.    Appearance: She is well-developed. She is not diaphoretic.  HENT:     Head: Normocephalic and atraumatic.     Ears:     Comments: Serous appearing effusion to bilateral TMs without erythema or distortion of landmarks.    Mouth/Throat:     Comments: Poor dentition diffusely.  I do not appreciate any fluctuance or edema to the right upper aspect of the jaw.  She has no obvious edema to the right side of the face.  Tolerating secretions without difficulty able to rotate her head without pain. Eyes:     Pupils: Pupils are equal, round, and reactive to light.  Cardiovascular:     Rate and Rhythm: Normal rate and regular rhythm.  Heart sounds: No murmur heard.    No friction rub. No gallop.  Pulmonary:     Effort: Pulmonary effort is normal.     Breath sounds: No wheezing or rales.  Abdominal:     General: There is no distension.     Palpations: Abdomen is soft.     Tenderness: There is no abdominal tenderness.  Musculoskeletal:        General: No tenderness.     Cervical back: Normal range of motion and neck supple.  Skin:    General: Skin is warm and dry.  Neurological:     Mental Status: She is alert and oriented to person, place, and time.  Psychiatric:        Behavior: Behavior normal.     ED Results / Procedures / Treatments   Labs (all labs ordered are listed, but only abnormal results are displayed) Labs Reviewed - No data to display  EKG None  Radiology No results found.  Procedures Procedures     Medications Ordered in ED Medications  dexamethasone (DECADRON) 10 MG/ML injection for Pediatric ORAL use 10 mg (has no administration in time range)    ED Course/ Medical Decision Making/ A&P                             Medical Decision Making  47 yo F with a chief complaint of right-sided facial pain painful swallowing and rash to her arms.  She is worried that she needs a stronger antibiotic.  She thinks she developed a sinus infection.  She tells me in the same sentence that she has seen ENT and they did not think that she had a sinus infection.  Not sure that it makes sense for me to change her antibiotics at this time.  Encouraged her to follow-up with her family doctor in the office.  Will give a dose of Decadron and see if maybe that helps her with her sore throat  She has 2 small pustules on bilateral forearms, do not see any significant rash.  1:13 AM:  I have discussed the diagnosis/risks/treatment options with the patient.  Evaluation and diagnostic testing in the emergency department does not suggest an emergent condition requiring admission or immediate intervention beyond what has been performed at this time.  They will follow up with PCP. We also discussed returning to the ED immediately if new or worsening sx occur. We discussed the sx which are most concerning (e.g., sudden worsening pain, fever, inability to tolerate by mouth) that necessitate immediate return. Medications administered to the patient during their visit and any new prescriptions provided to the patient are listed below.  I was called back to the patient's bedside.  She expressed her concern about how she was having symptoms and felt that they were not appropriately evaluated.  I had a long discussion with her about how I felt she had been evaluated and did not require any further testing.  I encouraged her to follow-up with her family doctor and ear nose and throat physician in the office.  I told her I did  not feel comfortable saying she had sinusitis after an ear nose and throat physician had told her that she did not.   Medications given during this visit Medications  dexamethasone (DECADRON) 10 MG/ML injection for Pediatric ORAL use 10 mg (has no administration in time range)     The patient appears reasonably screen and/or stabilized for discharge  and I doubt any other medical condition or other [facility] requiring further screening, evaluation, or treatment in the ED at this time prior to discharge.          Final Clinical Impression(s) / ED Diagnoses Final diagnoses:  Rash and nonspecific skin eruption  Sore throat  Right facial pain    Rx / DC Orders ED Discharge Orders     None         Melene Plan, DO 04/08/23 0113    Melene Plan, DO 04/08/23 0151

## 2023-04-08 NOTE — Discharge Instructions (Signed)
Take 4 over the counter ibuprofen tablets 3 times a day or 2 over-the-counter naproxen tablets twice a day for pain. Also take tylenol 1000mg(2 extra strength) four times a day.    

## 2023-04-08 NOTE — ED Notes (Addendum)
Patient upset about discharge, requesting antibiotics and states "chills are not normal". MD requested to bedside to speak to patient. MD discussed examination and the need to follow up with PCP. Discharge instructions provided. Opportunity for questioning and answers were provided. Pt discharged from ED. Pt ambulatory to ED waiting room with steady gait.

## 2023-04-15 ENCOUNTER — Encounter (HOSPITAL_COMMUNITY): Payer: Self-pay

## 2023-04-15 ENCOUNTER — Emergency Department (HOSPITAL_COMMUNITY)
Admission: EM | Admit: 2023-04-15 | Discharge: 2023-04-15 | Disposition: A | Discharge status: Home / Self Care | Payer: Medicaid Other | Attending: Emergency Medicine | Admitting: Emergency Medicine

## 2023-04-15 ENCOUNTER — Emergency Department (HOSPITAL_COMMUNITY): Payer: Medicaid Other

## 2023-04-15 ENCOUNTER — Other Ambulatory Visit: Payer: Self-pay

## 2023-04-15 DIAGNOSIS — Z20822 Contact with and (suspected) exposure to covid-19: Secondary | ICD-10-CM | POA: Insufficient documentation

## 2023-04-15 DIAGNOSIS — R0789 Other chest pain: Secondary | ICD-10-CM

## 2023-04-15 DIAGNOSIS — Z79899 Other long term (current) drug therapy: Secondary | ICD-10-CM | POA: Diagnosis not present

## 2023-04-15 DIAGNOSIS — E876 Hypokalemia: Secondary | ICD-10-CM

## 2023-04-15 LAB — CBC WITH DIFFERENTIAL/PLATELET
Abs Immature Granulocytes: 0 10*3/uL (ref 0.00–0.07)
Basophils Absolute: 0 10*3/uL (ref 0.0–0.1)
Basophils Relative: 1 %
Eosinophils Absolute: 0.1 10*3/uL (ref 0.0–0.5)
Eosinophils Relative: 2 %
HCT: 34.9 % — ABNORMAL LOW (ref 36.0–46.0)
Hemoglobin: 11.1 g/dL — ABNORMAL LOW (ref 12.0–15.0)
Immature Granulocytes: 0 %
Lymphocytes Relative: 41 %
Lymphs Abs: 2.2 10*3/uL (ref 0.7–4.0)
MCH: 24.8 pg — ABNORMAL LOW (ref 26.0–34.0)
MCHC: 31.8 g/dL (ref 30.0–36.0)
MCV: 78.1 fL — ABNORMAL LOW (ref 80.0–100.0)
Monocytes Absolute: 0.4 10*3/uL (ref 0.1–1.0)
Monocytes Relative: 8 %
Neutro Abs: 2.6 10*3/uL (ref 1.7–7.7)
Neutrophils Relative %: 48 %
Platelets: 284 10*3/uL (ref 150–400)
RBC: 4.47 MIL/uL (ref 3.87–5.11)
RDW: 15.1 % (ref 11.5–15.5)
WBC: 5.3 10*3/uL (ref 4.0–10.5)
nRBC: 0 % (ref 0.0–0.2)

## 2023-04-15 LAB — URINALYSIS, ROUTINE W REFLEX MICROSCOPIC
Bacteria, UA: NONE SEEN
Bilirubin Urine: NEGATIVE
Glucose, UA: NEGATIVE mg/dL
Ketones, ur: NEGATIVE mg/dL
Leukocytes,Ua: NEGATIVE
Nitrite: NEGATIVE
Protein, ur: NEGATIVE mg/dL
Specific Gravity, Urine: 1.016 (ref 1.005–1.030)
pH: 6 (ref 5.0–8.0)

## 2023-04-15 LAB — COMPREHENSIVE METABOLIC PANEL
ALT: 61 U/L — ABNORMAL HIGH (ref 0–44)
AST: 39 U/L (ref 15–41)
Albumin: 3.2 g/dL — ABNORMAL LOW (ref 3.5–5.0)
Alkaline Phosphatase: 36 U/L — ABNORMAL LOW (ref 38–126)
Anion gap: 14 (ref 5–15)
BUN: 9 mg/dL (ref 6–20)
CO2: 25 mmol/L (ref 22–32)
Calcium: 9.1 mg/dL (ref 8.9–10.3)
Chloride: 100 mmol/L (ref 98–111)
Creatinine, Ser: 0.81 mg/dL (ref 0.44–1.00)
GFR, Estimated: >60 mL/min (ref >60)
Glucose, Bld: 91 mg/dL (ref 70–99)
Potassium: 3.1 mmol/L — ABNORMAL LOW (ref 3.5–5.1)
Sodium: 139 mmol/L (ref 135–145)
Total Bilirubin: 0.5 mg/dL (ref 0.3–1.2)
Total Protein: 6.1 g/dL — ABNORMAL LOW (ref 6.5–8.1)

## 2023-04-15 LAB — RESP PANEL BY RT-PCR (RSV, FLU A&B, COVID)  RVPGX2
Influenza A by PCR: NEGATIVE
Influenza B by PCR: NEGATIVE
Resp Syncytial Virus by PCR: NEGATIVE
SARS Coronavirus 2 by RT PCR: NEGATIVE

## 2023-04-15 LAB — MAGNESIUM: Magnesium: 1.8 mg/dL (ref 1.7–2.4)

## 2023-04-15 LAB — TROPONIN I (HIGH SENSITIVITY)
Troponin I (High Sensitivity): 5 ng/L (ref <18)
Troponin I (High Sensitivity): 4 ng/L (ref <18)

## 2023-04-15 LAB — D-DIMER, QUANTITATIVE: D-Dimer, Quant: <0.27 ug/mL-FEU (ref 0.00–0.50)

## 2023-04-15 MED ORDER — POTASSIUM CHLORIDE CRYS ER 20 MEQ PO TBCR
40.0000 meq | EXTENDED_RELEASE_TABLET | Freq: Once | ORAL | Status: AC
  Administered 2023-04-15: 40 meq via ORAL
  Filled 2023-04-15: qty 2

## 2023-04-15 NOTE — Discharge Instructions (Signed)
You were seen in the emergency department for chest pain. Your labs, EKG, and chest xray were all unremarkable. Please follow up with your neurologist for further evaluation. If you have any worsening of your symptoms, return to the ER.

## 2023-04-15 NOTE — ED Provider Notes (Signed)
[place] EMERGENCY DEPARTMENT AT [facility] Provider Note   CSN: [id number] Arrival date & time: 04/15/23  1109     History Chief Complaint  Patient presents with   Chest Pain    Lydia Gross is a 47 y.o. female. Patient presents to the ED with concerns of chest pain. She reports that she has been seen numerous times in multiple ERs with concerns that "something is wrong and doesn't feel right" on the left side of her body. Recently had dental infection treated on left side. Denies any fevers, but states chest pain and dizziness worsened today. No prior cardiac or pulmonary history. Patient has been imaging extensively with xray, CT, and MRI starting around March until now.   Chest Pain      Home Medications Prior to Admission medications   Medication Sig Start Date End Date Taking? Authorizing Provider  famotidine (PEPCID) 20 MG tablet Take 1 tablet (20 mg total) by mouth 2 (two) times daily. Patient not taking: Reported on 02/13/2023 01/13/23   Jeanelle Malling, PA  FLUoxetine (PROZAC) 10 MG capsule Take 10 mg by mouth daily. 01/10/23   [provider]  hydrochlorothiazide (HYDRODIURIL) 25 MG tablet Take 25 mg by mouth every morning.     [provider]  magic mouthwash (lidocaine, diphenhydrAMINE, alum & mag hydroxide) suspension Swish and swallow 15 mLs 4 (four) times daily as needed (Gargle and swallow when you are experiencing symptoms (up to four times daily)). 03/19/23   Gailen Shelter, PA  naproxen (NAPROSYN) 125 MG/5ML suspension Take 10 mLs (250 mg total) by mouth 2 (two) times daily as needed. 03/25/23   Tilden Fossa, MD  omeprazole (PRILOSEC) 40 MG capsule Take by mouth. 01/17/23 07/16/23  [provider]  sucralfate (CARAFATE) 1 g tablet Take 1 tablet (1 g total) by mouth 4 (four) times daily -  with meals and at bedtime for 7 days. 03/11/23 03/18/23  Melene Plan, DO      Allergies    Patient has no known allergies.    Review of Systems    Review of Systems  Cardiovascular:  Positive for chest pain.  All other systems reviewed and are negative.   Physical Exam Updated Vital Signs BP (!) 106/48   Pulse 85   Temp (!) 97.3 F (36.3 C) (Oral)   Resp 20   Ht 5\' 10"  (1.778 m)   Wt (!) 138.3 kg   LMP 04/11/2023   SpO2 100%   BMI 43.75 kg/m  Physical Exam Vitals and nursing note reviewed.  Constitutional:      General: She is not in acute distress.    Appearance: She is well-developed.  HENT:     Head: Normocephalic and atraumatic.  Eyes:     Conjunctiva/sclera: Conjunctivae normal.  Cardiovascular:     Rate and Rhythm: Normal rate and regular rhythm.     Heart sounds: No murmur heard. Pulmonary:     Effort: Pulmonary effort is normal. No respiratory distress.     Breath sounds: Normal breath sounds.  Abdominal:     Palpations: Abdomen is soft.     Tenderness: There is no abdominal tenderness.  Musculoskeletal:        General: No swelling.     Cervical back: Neck supple.  Skin:    General: Skin is warm and dry.     Capillary Refill: Capillary refill takes less than 2 seconds.  Neurological:     Mental Status: She is alert.  Psychiatric:  Mood and Affect: Mood normal.     ED Results / Procedures / Treatments   Labs (all labs ordered are listed, but only abnormal results are displayed) Labs Reviewed  COMPREHENSIVE METABOLIC PANEL - Abnormal; Notable for the following components:      Result Value   Potassium 3.1 (*)    Total Protein 6.1 (*)    Albumin 3.2 (*)    ALT 61 (*)    Alkaline Phosphatase 36 (*)    All other components within normal limits  CBC WITH DIFFERENTIAL/PLATELET - Abnormal; Notable for the following components:   Hemoglobin 11.1 (*)    HCT 34.9 (*)    MCV 78.1 (*)    MCH 24.8 (*)    All other components within normal limits  URINALYSIS, ROUTINE W REFLEX MICROSCOPIC - Abnormal; Notable for the following components:   APPearance HAZY (*)    Hgb urine dipstick MODERATE  (*)    All other components within normal limits  RESP PANEL BY RT-PCR (RSV, FLU A&B, COVID)  RVPGX2  MAGNESIUM  D-DIMER, QUANTITATIVE  TROPONIN I (HIGH SENSITIVITY)  TROPONIN I (HIGH SENSITIVITY)    EKG EKG Interpretation Date/Time:  Monday April 15 2023 11:14:44 EDT Ventricular Rate:  77 PR Interval:  187 QRS Duration:  100 QT Interval:  416 QTC Calculation: 471 R Axis:   27  Text Interpretation: Sinus rhythm Abnormal R-wave progression, early transition Confirmed by Kristine Royal [id number]) on 04/15/2023 3:27:44 PM  Radiology No results found.  Procedures Procedures   Medications Ordered in ED Medications  potassium chloride SA (KLOR-CON M) CR tablet 40 mEq (40 mEq Oral Given 04/15/23 1527)    ED Course/ Medical Decision Making/ A&P                           Medical Decision Making Amount and/or Complexity of Data Reviewed Labs: ordered. Radiology: ordered.  Risk Prescription drug management.   This patient presents to the ED for concern of chest pain. Differential diagnosis includes ACS, MI, PE, bronchitis, pneumonia    Additional history obtained:  Additional history obtained from chart review External records from outside source obtained and reviewed including multiple recent ED visit at numerous facilities for similar concerns that something doesn't feel right to her    Lab Tests:  I Ordered, and personally interpreted labs.  The pertinent results include:  CBC unremarkable, CMP unremarkable, UA with some blood but patient recently menstruated, magnesium normal, troponin and delta troponin negative, d-dimer negative, RVP negative for flu, COVID, and RSV   Imaging Studies ordered:  I ordered imaging studies including chest xray  I independently visualized and interpreted imaging which showed no acute cardiopulmonary process I agree with the radiologist interpretation   Medicines ordered and prescription drug management:  I ordered medication  including potassium  for hypokalemia  Reevaluation of the patient after these medicines showed that the patient unchanged I have reviewed the patients home medicines and have made adjustments as needed   Problem List / ED Course:  Patient presents to the ED with concerns of chest pain. She has numerous concerns which have been evaluated extensively here in the ED and other local EDs. She states that she recently had a dental infection and tooth extraction that has since resulted in continued problems for her. She endorses feeling that her left side of her body "feels off" and only improves with amoxicillin. She has been amoxicillin for 3 weeks per her report.  She is worried about a blood infection, but does not currently meet any SIRS criteria. Will evaluate primary concern of chest pain with cardiac workup. CBC, CMP, troponin, UA, and d-dimer all reassuring without signs of infection, infarction, clotting, or other acute abnormalities. EKG and CXR also unremarkable at this time. Patient does not appear to be evidently malingering as there is no external motivation that I am able to appreciate. She appears genuinely concerned that something is abnormal, but chart review shows extensive imaging and other workup without any particular abnormalities noted with most recent ED visit being 04/14/2023. Has been seen for paresthesia, rash, dental pain, foot pain, headache, lower extremity pain, ankle pain, and dysphagia all since 03/16/2023 for a total of 9 ED visits in this timeframe. She states that she has been seen by her PCP and specialist including GI, ENT, neurosurgery, and pulmonology as well. At this point there does not appear to be any acute abnormality noted on workup and informed patient that all of her results are unremarkable and within normal limits with the exception of hypokalemia. Encouraged patient to follow up with PCP and specialist team as needed or return to the ED if she has acute worsening  of her symptoms. Patient is agreeable with treatment plan and verbalized understanding all return precautions. All questions answered prior to patient discharge. Patient discharged home in good condition.  Final Clinical Impression(s) / ED Diagnoses Final diagnoses:  Other chest pain  Hypokalemia    Rx / DC Orders ED Discharge Orders     None         Smitty Knudsen, PA-C 04/17/23 1633    Wynetta Fines, MD 04/17/23 1718

## 2023-04-15 NOTE — ED Triage Notes (Signed)
Pt to ED via EMS from home with c/o CP and dizziness. Per EMS pt stated she started feeling dizzy today and also had cp. VSS, NADN. Pt is Aox4 in triage reports CP on left side radiating to back.

## 2023-06-02 ENCOUNTER — Encounter (HOSPITAL_COMMUNITY): Payer: Self-pay

## 2023-06-02 ENCOUNTER — Emergency Department (HOSPITAL_COMMUNITY)
Admission: EM | Admit: 2023-06-02 | Discharge: 2023-06-02 | Disposition: A | Discharge status: Home / Self Care | Payer: Medicaid Other | Attending: Emergency Medicine | Admitting: Emergency Medicine

## 2023-06-02 ENCOUNTER — Emergency Department (HOSPITAL_COMMUNITY): Payer: Medicaid Other

## 2023-06-02 DIAGNOSIS — R1084 Generalized abdominal pain: Secondary | ICD-10-CM

## 2023-06-02 DIAGNOSIS — I10 Essential (primary) hypertension: Secondary | ICD-10-CM | POA: Insufficient documentation

## 2023-06-02 DIAGNOSIS — Z79899 Other long term (current) drug therapy: Secondary | ICD-10-CM | POA: Diagnosis not present

## 2023-06-02 LAB — COMPREHENSIVE METABOLIC PANEL
ALT: 13 U/L (ref 0–44)
AST: 16 U/L (ref 15–41)
Albumin: 3.4 g/dL — ABNORMAL LOW (ref 3.5–5.0)
Alkaline Phosphatase: 34 U/L — ABNORMAL LOW (ref 38–126)
Anion gap: 7 (ref 5–15)
BUN: 13 mg/dL (ref 6–20)
CO2: 26 mmol/L (ref 22–32)
Calcium: 8.8 mg/dL — ABNORMAL LOW (ref 8.9–10.3)
Chloride: 104 mmol/L (ref 98–111)
Creatinine, Ser: 0.81 mg/dL (ref 0.44–1.00)
GFR, Estimated: >60 mL/min (ref >60)
Glucose, Bld: 94 mg/dL (ref 70–99)
Potassium: 3.2 mmol/L — ABNORMAL LOW (ref 3.5–5.1)
Sodium: 137 mmol/L (ref 135–145)
Total Bilirubin: 0.7 mg/dL (ref 0.3–1.2)
Total Protein: 6.8 g/dL (ref 6.5–8.1)

## 2023-06-02 LAB — URINALYSIS, ROUTINE W REFLEX MICROSCOPIC
Bacteria, UA: NONE SEEN
Bilirubin Urine: NEGATIVE
Glucose, UA: NEGATIVE mg/dL
Hgb urine dipstick: NEGATIVE
Ketones, ur: NEGATIVE mg/dL
Nitrite: NEGATIVE
Protein, ur: NEGATIVE mg/dL
Specific Gravity, Urine: 1.013 (ref 1.005–1.030)
pH: 6 (ref 5.0–8.0)

## 2023-06-02 LAB — CBC WITH DIFFERENTIAL/PLATELET
Abs Immature Granulocytes: 0.02 10*3/uL (ref 0.00–0.07)
Basophils Absolute: 0 10*3/uL (ref 0.0–0.1)
Basophils Relative: 1 %
Eosinophils Absolute: 0.1 10*3/uL (ref 0.0–0.5)
Eosinophils Relative: 1 %
HCT: 35.4 % — ABNORMAL LOW (ref 36.0–46.0)
Hemoglobin: 11.4 g/dL — ABNORMAL LOW (ref 12.0–15.0)
Immature Granulocytes: 0 %
Lymphocytes Relative: 41 %
Lymphs Abs: 2.8 10*3/uL (ref 0.7–4.0)
MCH: 24.4 pg — ABNORMAL LOW (ref 26.0–34.0)
MCHC: 32.2 g/dL (ref 30.0–36.0)
MCV: 75.6 fL — ABNORMAL LOW (ref 80.0–100.0)
Monocytes Absolute: 0.5 10*3/uL (ref 0.1–1.0)
Monocytes Relative: 7 %
Neutro Abs: 3.4 10*3/uL (ref 1.7–7.7)
Neutrophils Relative %: 50 %
Platelets: 279 10*3/uL (ref 150–400)
RBC: 4.68 MIL/uL (ref 3.87–5.11)
RDW: 15.3 % (ref 11.5–15.5)
WBC: 6.9 10*3/uL (ref 4.0–10.5)
nRBC: 0 % (ref 0.0–0.2)

## 2023-06-02 LAB — POC URINE PREG, ED: Preg Test, Ur: NEGATIVE

## 2023-06-02 LAB — LIPASE, BLOOD: Lipase: 29 U/L (ref 11–51)

## 2023-06-02 MED ORDER — IOHEXOL 300 MG/ML  SOLN
100.0000 mL | Freq: Once | INTRAMUSCULAR | Status: AC | PRN
  Administered 2023-06-02: 100 mL via INTRAVENOUS

## 2023-06-02 NOTE — ED Triage Notes (Signed)
Pt arrived via POV, c/o diffuse abd pain for several weeks, nausea and constipation. States pain will radiate to back. Has been seen with no dx by GI

## 2023-06-02 NOTE — ED Provider Notes (Signed)
[place] EMERGENCY DEPARTMENT AT [facility] Provider Note   CSN: [id number] Arrival date & time: 06/02/23  1220     History  Chief Complaint  Patient presents with   Abdominal Pain    Lydia Gross is a 47 y.o. female history of IIH, anemia, back pain, GERD, hypertension here for evaluation of abdominal pain.  Patient states she has had generalized abdominal pain over the last month or so.  Initially started out at her bilateral flanks however radiates into her front of her abdomen over the last 3 weeks.  She is followed by GI.  She had asked for a CT scan however they stated they need to see her for this issue and she has not been able to schedule appointment.  She has had intermittent nausea.  No vomiting.  Also states she previously was used to have bowel movements multiple times daily however she is now only having bowel movements every 4 to 5 days.  Bowel movement this morning.  States she is trying fiber supplements without relief.  Tried MiraLAX once, single capful, did not work so she did not try it again.  No headache, vision changes, neck pain, chest pain, shortness of breath, difficulty swallowing.  She did note that she has had multiple dental caries and has been on and off antibiotics over the last few months however she has been off of these for at least 6 weeks at this point.  Has noted some dark urine was post referred to urology however she was told by her PCP there was no infection and her kidney function was "normal."  She denies any dysuria hematuria at this time.  No recent colonoscopy however did have endoscopy in May which did not show significant findings.  She has chronic pill dysphagia followed by [facility] in [place]  HPI     Home Medications Prior to Admission medications   Medication Sig Start Date End Date Taking? Authorizing Provider  famotidine (PEPCID) 20 MG tablet Take 1 tablet (20 mg total) by mouth 2 (two) times  daily. Patient not taking: Reported on 02/13/2023 01/13/23   Jeanelle Malling, PA  FLUoxetine (PROZAC) 10 MG capsule Take 10 mg by mouth daily. 01/10/23   [provider]  hydrochlorothiazide (HYDRODIURIL) 25 MG tablet Take 25 mg by mouth every morning.     [provider]  magic mouthwash (lidocaine, diphenhydrAMINE, alum & mag hydroxide) suspension Swish and swallow 15 mLs 4 (four) times daily as needed (Gargle and swallow when you are experiencing symptoms (up to four times daily)). 03/19/23   Gailen Shelter, PA  naproxen (NAPROSYN) 125 MG/5ML suspension Take 10 mLs (250 mg total) by mouth 2 (two) times daily as needed. 03/25/23   Tilden Fossa, MD  omeprazole (PRILOSEC) 40 MG capsule Take by mouth. 01/17/23 07/16/23  [provider]  sucralfate (CARAFATE) 1 g tablet Take 1 tablet (1 g total) by mouth 4 (four) times daily -  with meals and at bedtime for 7 days. 03/11/23 03/18/23  Melene Plan, DO      Allergies    Patient has no known allergies.    Review of Systems   Review of Systems  Constitutional: Negative.   HENT: Negative.    Respiratory: Negative.    Cardiovascular: Negative.   Gastrointestinal:  Positive for abdominal pain, constipation and nausea. Negative for abdominal distention, anal bleeding, blood in stool, diarrhea and vomiting.  Genitourinary: Negative.   Musculoskeletal: Negative.   Skin: Negative.  Neurological: Negative.   All other systems reviewed and are negative.   Physical Exam Updated Vital Signs BP 127/72   Pulse 83   Temp 98.5 F (36.9 C)   Resp 18   SpO2 99%  Physical Exam Vitals and nursing note reviewed.  Constitutional:      General: She is not in acute distress.    Appearance: She is well-developed. She is not ill-appearing, toxic-appearing or diaphoretic.  HENT:     Head: Normocephalic and atraumatic.  Eyes:     Pupils: Pupils are equal, round, and reactive to light.  Cardiovascular:     Rate and Rhythm: Normal rate.      Heart sounds: Normal heart sounds.  Pulmonary:     Effort: Pulmonary effort is normal. No respiratory distress.     Breath sounds: Normal breath sounds.  Abdominal:     General: Bowel sounds are normal. There is no distension.     Palpations: Abdomen is soft.     Tenderness: There is no abdominal tenderness. There is no right CVA tenderness, left CVA tenderness, guarding or rebound. Negative signs include Murphy's sign and McBurney's sign.     Hernia: No hernia is present.  Musculoskeletal:        General: Normal range of motion.     Cervical back: Normal range of motion.  Skin:    General: Skin is warm and dry.     Capillary Refill: Capillary refill takes less than 2 seconds.  Neurological:     General: No focal deficit present.     Mental Status: She is alert.  Psychiatric:        Mood and Affect: Mood normal.    ED Results / Procedures / Treatments   Labs (all labs ordered are listed, but only abnormal results are displayed) Labs Reviewed  CBC WITH DIFFERENTIAL/PLATELET - Abnormal; Notable for the following components:      Result Value   Hemoglobin 11.4 (*)    HCT 35.4 (*)    MCV 75.6 (*)    MCH 24.4 (*)    All other components within normal limits  COMPREHENSIVE METABOLIC PANEL - Abnormal; Notable for the following components:   Potassium 3.2 (*)    Calcium 8.8 (*)    Albumin 3.4 (*)    Alkaline Phosphatase 34 (*)    All other components within normal limits  URINALYSIS, ROUTINE W REFLEX MICROSCOPIC - Abnormal; Notable for the following components:   APPearance HAZY (*)    Leukocytes,Ua TRACE (*)    All other components within normal limits  LIPASE, BLOOD  POC URINE PREG, ED    EKG None  Radiology CT ABDOMEN PELVIS W CONTRAST  Result Date: 06/02/2023 CLINICAL DATA:  Diffuse abdominal pain for several weeks. EXAM: CT ABDOMEN AND PELVIS WITH CONTRAST TECHNIQUE: Multidetector CT imaging of the abdomen and pelvis was performed using the standard protocol  following bolus administration of intravenous contrast. RADIATION DOSE REDUCTION: This exam was performed according to the departmental dose-optimization program which includes automated exposure control, adjustment of the mA and/or kV according to patient size and/or use of iterative reconstruction technique. CONTRAST:  OMNIPAQUE IOHEXOL 300 MG/ML  SOLN COMPARISON:  Pelvic ultrasound dated 05/01/2022. FINDINGS: Lower chest: No acute abnormality. Hepatobiliary: No focal liver abnormality is seen. Status post cholecystectomy. No biliary dilatation. Pancreas: Unremarkable. No pancreatic ductal dilatation or surrounding inflammatory changes. Spleen: Normal in size without focal abnormality. Adrenals/Urinary Tract: Adrenal glands are unremarkable. Kidneys are normal, without renal calculi, focal  lesion, or hydronephrosis. Bladder is unremarkable. Stomach/Bowel: Stomach is within normal limits. Appendix appears normal. No evidence of bowel wall thickening, distention, or inflammatory changes. Vascular/Lymphatic: No significant vascular findings are present. No enlarged abdominal or pelvic lymph nodes. Reproductive: Uterus and bilateral adnexa are unremarkable. Other: No abdominal wall hernia or abnormality. No abdominopelvic ascites. Musculoskeletal: Severe degenerative changes are seen at L5-S1. IMPRESSION: 1. No acute findings in the abdomen or pelvis. Electronically Signed   By: Romona Curls M.D.   On: 06/02/2023 15:24    Procedures Procedures    Medications Ordered in ED Medications  iohexol (OMNIPAQUE) 300 MG/ML solution 100 mL (100 mLs Intravenous Contrast Given 06/02/23 1450)    ED Course/ Medical Decision Making/ A&P   47 year old multiple medical comorbidities here for evaluation of diffuse intermittent abdominal pain over the last month or so.  Associated with change in bowel movements, now constipated, previously had multiple bowel movements daily.  No blood in stool.  She is tolerating p.o.  intake.  Last bowel movement was actually this morning. Benign abdominal exam appears clinically well-hydrated.  Will plan on labs, imaging and reassess  Labs and imaging personally viewed and interpreted:  CBC without leukocytosis, hemoglobin 11.4 at baseline Metabolic panel potassium 3.2 Pregnancy test negative Lipase 29 UA negative for infection   Reassessed.  We discussed labs and imaging.  CT scan  CT scan without acute abnormality.  Discussed results with patient.  Recommend increasing her MiraLAX as tolerated for constipation.  Had bowel movement this morning.  No fecal impaction on CT scan will have her follow-up with GI as well as urology  Patient is nontoxic, nonseptic appearing, in no apparent distress.  Patient's pain and other symptoms adequately managed in emergency department.  Fluid bolus given.  Labs, imaging and vitals reviewed.  Patient does not meet the SIRS or Sepsis criteria.  On repeat exam patient does not have a surgical abdomin and there are no peritoneal signs.  No indication of appendicitis, bowel obstruction, bowel perforation, cholecystitis, diverticulitis, PID, torsion, toa or ectopic pregnancy.  Patient discharged home with symptomatic treatment and given strict instructions for follow-up with their primary care physician.  I have also discussed reasons to return immediately to the ER.  Patient expresses understanding and agrees with plan.                                   Medical Decision Making Amount and/or Complexity of Data Reviewed External Data Reviewed: labs, radiology and notes. Labs: ordered. Decision-making details documented in ED Course. Radiology: ordered and independent interpretation performed. Decision-making details documented in ED Course.  Risk OTC drugs. Prescription drug management. Decision regarding hospitalization. Diagnosis or treatment significantly limited by social determinants of health.           Final Clinical  Impression(s) / ED Diagnoses Final diagnoses:  Generalized abdominal pain    Rx / DC Orders ED Discharge Orders     None         Dezire Turk A, PA-C 06/02/23 1533    Long, Arlyss Repress, MD 06/03/23 [phone]

## 2023-06-02 NOTE — Discharge Instructions (Signed)
Make sure to follow-up with your gastroenterologist and your urologist  Take 1 scoop full of MiraLAX and 8 ounces of water twice daily.  May increase as needed for constipation

## 2023-06-04 ENCOUNTER — Encounter: Payer: Self-pay | Admitting: Neurology

## 2023-06-15 ENCOUNTER — Encounter (HOSPITAL_COMMUNITY): Payer: Self-pay | Admitting: *Deleted

## 2023-06-15 ENCOUNTER — Emergency Department (HOSPITAL_COMMUNITY)
Admission: EM | Admit: 2023-06-15 | Discharge: 2023-06-15 | Disposition: A | Discharge status: Home / Self Care | Payer: Medicaid Other | Attending: Emergency Medicine | Admitting: Emergency Medicine

## 2023-06-15 ENCOUNTER — Emergency Department (HOSPITAL_COMMUNITY): Payer: Medicaid Other

## 2023-06-15 ENCOUNTER — Other Ambulatory Visit: Payer: Self-pay

## 2023-06-15 DIAGNOSIS — R209 Unspecified disturbances of skin sensation: Secondary | ICD-10-CM | POA: Diagnosis not present

## 2023-06-15 DIAGNOSIS — R590 Localized enlarged lymph nodes: Secondary | ICD-10-CM | POA: Insufficient documentation

## 2023-06-15 DIAGNOSIS — Z1152 Encounter for screening for COVID-19: Secondary | ICD-10-CM | POA: Diagnosis not present

## 2023-06-15 DIAGNOSIS — M791 Myalgia, unspecified site: Secondary | ICD-10-CM | POA: Diagnosis not present

## 2023-06-15 DIAGNOSIS — R519 Headache, unspecified: Secondary | ICD-10-CM | POA: Diagnosis not present

## 2023-06-15 DIAGNOSIS — Z79899 Other long term (current) drug therapy: Secondary | ICD-10-CM | POA: Diagnosis not present

## 2023-06-15 DIAGNOSIS — R202 Paresthesia of skin: Secondary | ICD-10-CM

## 2023-06-15 LAB — COMPREHENSIVE METABOLIC PANEL
ALT: 14 U/L (ref 0–44)
AST: 15 U/L (ref 15–41)
Albumin: 3.3 g/dL — ABNORMAL LOW (ref 3.5–5.0)
Alkaline Phosphatase: 33 U/L — ABNORMAL LOW (ref 38–126)
Anion gap: 10 (ref 5–15)
BUN: 11 mg/dL (ref 6–20)
CO2: 27 mmol/L (ref 22–32)
Calcium: 8.9 mg/dL (ref 8.9–10.3)
Chloride: 102 mmol/L (ref 98–111)
Creatinine, Ser: 0.79 mg/dL (ref 0.44–1.00)
GFR, Estimated: >60 mL/min (ref >60)
Glucose, Bld: 109 mg/dL — ABNORMAL HIGH (ref 70–99)
Potassium: 3.2 mmol/L — ABNORMAL LOW (ref 3.5–5.1)
Sodium: 139 mmol/L (ref 135–145)
Total Bilirubin: 0.8 mg/dL (ref 0.3–1.2)
Total Protein: 6.2 g/dL — ABNORMAL LOW (ref 6.5–8.1)

## 2023-06-15 LAB — CBC
HCT: 33.8 % — ABNORMAL LOW (ref 36.0–46.0)
Hemoglobin: 10.6 g/dL — ABNORMAL LOW (ref 12.0–15.0)
MCH: 23.8 pg — ABNORMAL LOW (ref 26.0–34.0)
MCHC: 31.4 g/dL (ref 30.0–36.0)
MCV: 75.8 fL — ABNORMAL LOW (ref 80.0–100.0)
Platelets: 332 10*3/uL (ref 150–400)
RBC: 4.46 MIL/uL (ref 3.87–5.11)
RDW: 15.2 % (ref 11.5–15.5)
WBC: 5.8 10*3/uL (ref 4.0–10.5)
nRBC: 0 % (ref 0.0–0.2)

## 2023-06-15 LAB — SARS CORONAVIRUS 2 BY RT PCR: SARS Coronavirus 2 by RT PCR: NEGATIVE

## 2023-06-15 LAB — HCG, SERUM, QUALITATIVE: Preg, Serum: NEGATIVE

## 2023-06-15 LAB — URINALYSIS, ROUTINE W REFLEX MICROSCOPIC
Bilirubin Urine: NEGATIVE
Glucose, UA: NEGATIVE mg/dL
Hgb urine dipstick: NEGATIVE
Ketones, ur: NEGATIVE mg/dL
Leukocytes,Ua: NEGATIVE
Nitrite: NEGATIVE
Protein, ur: NEGATIVE mg/dL
Specific Gravity, Urine: 1.006 (ref 1.005–1.030)
pH: 6 (ref 5.0–8.0)

## 2023-06-15 MED ORDER — DEXAMETHASONE 4 MG PO TABS
10.0000 mg | ORAL_TABLET | Freq: Once | ORAL | Status: AC
  Administered 2023-06-15: 10 mg via ORAL
  Filled 2023-06-15: qty 3

## 2023-06-15 NOTE — ED Triage Notes (Signed)
The pt has had rt facial numbness rt ear and some throat pain dizziness rt shoulder pain and forgetting things chills intermittently rt ear rt face  rt throat numbness for the past week

## 2023-06-15 NOTE — ED Provider Notes (Signed)
[place] EMERGENCY DEPARTMENT AT [facility] Provider Note   CSN: [id number] Arrival date & time: 06/15/23  0136     History  Chief Complaint  Patient presents with   Generalized Body Aches    Lydia Gross is a 47 y.o. female.  Patient here with multiple symptoms over multiple periods of time. Her main issue is right frontal and paranasal pain. Seen by ENT already. Sggested face ct which she hasn't had. Has some drainage. Has paresthesias pretty much in all her extremities and face on multiple sides.         Home Medications Prior to Admission medications   Medication Sig Start Date End Date Taking? Authorizing Provider  FLUoxetine (PROZAC) 10 MG capsule Take 10 mg by mouth daily. 01/10/23   [provider]  hydrochlorothiazide (HYDRODIURIL) 25 MG tablet Take 25 mg by mouth every morning.     [provider]  magic mouthwash (lidocaine, diphenhydrAMINE, alum & mag hydroxide) suspension Swish and swallow 15 mLs 4 (four) times daily as needed (Gargle and swallow when you are experiencing symptoms (up to four times daily)). 03/19/23   Gailen Shelter, PA  naproxen (NAPROSYN) 125 MG/5ML suspension Take 10 mLs (250 mg total) by mouth 2 (two) times daily as needed. 03/25/23   Tilden Fossa, MD  omeprazole (PRILOSEC) 40 MG capsule Take by mouth. 01/17/23 07/16/23  [provider]  sucralfate (CARAFATE) 1 g tablet Take 1 tablet (1 g total) by mouth 4 (four) times daily -  with meals and at bedtime for 7 days. 03/11/23 03/18/23  Melene Plan, DO      Allergies    Patient has no known allergies.    Review of Systems   Review of Systems  Physical Exam Updated Vital Signs BP 115/78   Pulse (!) 56   Temp 98 F (36.7 C) (Oral)   Resp 20   Ht 5\' 10"  (1.778 m)   Wt (!) 138.3 kg   LMP 06/03/2023   SpO2 100%   BMI 43.75 kg/m  Physical Exam Vitals and nursing note reviewed.  Constitutional:      Appearance: She is well-developed.  HENT:      Head: Normocephalic and atraumatic.     Mouth/Throat:     Mouth: Mucous membranes are moist.     Pharynx: Oropharynx is clear. Posterior oropharyngeal erythema present.  Eyes:     Pupils: Pupils are equal, round, and reactive to light.  Cardiovascular:     Rate and Rhythm: Normal rate and regular rhythm.  Pulmonary:     Effort: No respiratory distress.     Breath sounds: No stridor.  Abdominal:     General: There is no distension.  Musculoskeletal:        General: No swelling or tenderness. Normal range of motion.     Cervical back: Normal range of motion.  Lymphadenopathy:     Cervical: Cervical adenopathy present.  Skin:    General: Skin is warm and dry.  Neurological:     General: No focal deficit present.     Mental Status: She is alert.     ED Results / Procedures / Treatments   Labs (all labs ordered are listed, but only abnormal results are displayed) Labs Reviewed  COMPREHENSIVE METABOLIC PANEL - Abnormal; Notable for the following components:      Result Value   Potassium 3.2 (*)    Glucose, Bld 109 (*)    Total Protein 6.2 (*)  Albumin 3.3 (*)    Alkaline Phosphatase 33 (*)    All other components within normal limits  CBC - Abnormal; Notable for the following components:   Hemoglobin 10.6 (*)    HCT 33.8 (*)    MCV 75.8 (*)    MCH 23.8 (*)    All other components within normal limits  URINALYSIS, ROUTINE W REFLEX MICROSCOPIC - Abnormal; Notable for the following components:   Color, Urine STRAW (*)    All other components within normal limits  SARS CORONAVIRUS 2 BY RT PCR  HCG, SERUM, QUALITATIVE    EKG None  Radiology CT Maxillofacial Wo Contrast  Result Date: 06/15/2023 CLINICAL DATA:  47 year old female with facial pain. Possible sinusitis. EXAM: CT MAXILLOFACIAL WITHOUT CONTRAST TECHNIQUE: Multidetector CT imaging of the maxillofacial structures was performed. Multiplanar CT image reconstructions were also generated. RADIATION DOSE REDUCTION:  This exam was performed according to the departmental dose-optimization program which includes automated exposure control, adjustment of the mA and/or kV according to patient size and/or use of iterative reconstruction technique. COMPARISON:  CT neck, MRI head orbits and cervical spine 03/27/2023. Prior head CT 01/22/2023. FINDINGS: Osseous: Mandible intact and normally aligned. Mandible tooth extraction sites, the most recent appearing on the left at the posterior bicuspid. No adverse features identified there. No acute dental finding elsewhere. Bilateral maxilla, zygoma, pterygoid, and nasal bones appear intact. Intact central skull base. Visible cervical vertebrae appear intact and aligned. Intact visible calvarium. Orbits: Intact orbital walls. Globes and intraorbital soft tissues appear symmetric and normal. Sinuses: Unchanged small left maxillary sinus alveolar recess mucous retention cyst which appears inconsequential on series 7, image 48. Otherwise the tympanic cavities, paranasal sinuses, and mastoids are clear. Negative nasal cavity aside from mild septal deviation. Soft tissues: Negative visible noncontrast larynx, pharynx, parapharyngeal spaces, retropharyngeal space (partially retropharyngeal course of the right carotid, normal variant), sublingual space, submandibular spaces, masticator and parotid spaces. No soft tissue inflammation identified. Visible upper cervical lymph nodes are within normal limits. Limited intracranial: Stable. Faint basal ganglia vascular calcifications. Partially empty sella. IMPRESSION: 1. Essentially negative noncontrast Face CT. Stable and well aerated paranasal sinuses, no evidence of sinusitis. 2. Partially empty sella, as per description on MRI Head and Orbits in June. Electronically Signed   By: Odessa Fleming M.D.   On: 06/15/2023 04:27    Procedures Procedures    Medications Ordered in ED Medications  dexamethasone (DECADRON) tablet 10 mg (10 mg Oral Given 06/15/23  0556)    ED Course/ Medical Decision Making/ A&P                                 Medical Decision Making Amount and/or Complexity of Data Reviewed Labs: ordered. Radiology: ordered.  Risk Prescription drug management.   Ct without sinus disease. With her paresthesias in multiple different dermatomes, doubt central causes to require further imaging or consultation.  Already seen ENT and neuro so will call them to get further recommendations. No indication for further workup or imaging at this time.    Final Clinical Impression(s) / ED Diagnoses Final diagnoses:  Nonintractable headache, unspecified chronicity pattern, unspecified headache type  Myalgia  Paresthesia    Rx / DC Orders ED Discharge Orders     None         Raymir Frommelt, Barbara Cower, MD 06/15/23 [phone]

## 2023-06-18 ENCOUNTER — Telehealth: Payer: Self-pay | Admitting: Neurology

## 2023-06-18 NOTE — Telephone Encounter (Signed)
FYI:  Pt will be a newpt on 9/13 w/Dr. Loleta Chance and she was wanting to make sure that Dr. Loleta Chance saw that she had gone to the ER on 06/15/2023.  Pt is aware that he will look over her records before her appt.  Nothing further is needed.

## 2023-06-20 NOTE — Progress Notes (Deleted)
Initial neurology clinic note  Lydia Gross MRN: [id number] DOB: 05-Dec-1975  Referring provider: Ladora Daniel, PA-C  Primary care provider: Ladora Daniel, PA-C  Reason for consult:  IIH  Subjective:  This is [name], a 47 y.o. ***-handed female with a medical history of *** who presents to neurology clinic with ***. The patient is accompanied by ***.  ***  Patient saw Dr. Rosalyn Gess at [facility] previously for headaches, most recently on 03/29/23. MRI brain showed partially empty sella concerning for IIH. Lumbar puncture was recommended at last clinic visit and completed on 04/19/23. Opening pressure was 23.6 cm H2O. Closing pressure was 17.4 cm of H2O after 21 cc of CSF was drained. CSF analysis was normal.   She was started on Diamox.***  Patient went to ED on 06/15/23 for right head pain. She was also endorsing paresthesias.  MEDICATIONS:  Outpatient Encounter Medications as of 06/28/2023  Medication Sig   FLUoxetine (PROZAC) 10 MG capsule Take 10 mg by mouth daily.   hydrochlorothiazide (HYDRODIURIL) 25 MG tablet Take 25 mg by mouth every morning.    magic mouthwash (lidocaine, diphenhydrAMINE, alum & mag hydroxide) suspension Swish and swallow 15 mLs 4 (four) times daily as needed (Gargle and swallow when you are experiencing symptoms (up to four times daily)).   naproxen (NAPROSYN) 125 MG/5ML suspension Take 10 mLs (250 mg total) by mouth 2 (two) times daily as needed.   omeprazole (PRILOSEC) 40 MG capsule Take by mouth.   sucralfate (CARAFATE) 1 g tablet Take 1 tablet (1 g total) by mouth 4 (four) times daily -  with meals and at bedtime for 7 days.   No facility-administered encounter medications on file as of 06/28/2023.    PAST MEDICAL HISTORY: Past Medical History:  Diagnosis Date   Acid reflux    Anxiety    Chronic neck pain    Gout    Hypertension    Obesity     PAST SURGICAL HISTORY: Past Surgical History:  Procedure Laterality Date    CESAREAN SECTION     CHOLECYSTECTOMY     DENTAL RESTORATION/EXTRACTION WITH X-RAY  03/05/2023   L lower molar extraction   TUBAL LIGATION      ALLERGIES: No Known Allergies  FAMILY HISTORY: Family History  Problem Relation Age of Onset   Hypertension Mother    Stroke Daughter    Thyroid disease Neg Hx     SOCIAL HISTORY: Social History   Tobacco Use   Smoking status: Former    Types: Cigarettes    Passive exposure: Never   Tobacco comments:    Stopped over a month ago   Substance Use Topics   Alcohol use: Yes    Comment: little   Drug use: No   Social History   Social History Narrative   Not on file    Objective:  Vital Signs:  LMP 06/03/2023   ***  Labs and Imaging review: Internal labs: No results found for: "HGBA1C" No results found for: "VITAMINB12" Lab Results  Component Value Date   TSH 0.457 03/27/2023   06/15/23: CBC significant for Hb 10.6, MCV 75.8 CMP significant for K 3.2, glucose 109,   External labs: CSF (04/19/23): Opening pressure 23.6 cm H2O Routine analysis: 2 R, 2 W, 31 P, 66 G Cytology negative ACE negative Culture negative Lactic acid wnl IgG index wnl OCB - 0 (normal)  Vit D (05/22/23): 13.5  Imaging: MRI brain and orbits w/wo contrast (03/27/23): FINDINGS: MRI HEAD  FINDINGS   Brain: No acute infarct or hemorrhage. No hydrocephalus or extra-axial collection. No foci of abnormal susceptibility. No mass or abnormal enhancement. Partially empty sella.   Vascular: Narrowing of the transverse sinuses near the transverse-sigmoid junction.   Skull and upper cervical spine: Normal marrow signal and enhancement.   Other: None.   MRI ORBITS FINDINGS   Orbits: No traumatic or inflammatory finding. Globes, optic nerves, orbital fat, extraocular muscles, vascular structures, and lacrimal glands are normal.   Visualized sinuses: Clear.   Soft tissues: Unremarkable.   IMPRESSION: 1. Partially empty sella and narrowing  of the transverse sinuses near the transverse-sigmoid junction. These findings can be seen in the setting of idiopathic intracranial hypertension. 2. Otherwise, normal MRI of the brain and orbits. No evidence of optic neuritis.  MRI cervical spine w/wo contrast (03/27/23): FINDINGS: Alignment: Straightening and mild kyphotic curvature in the cervical region. 1-2 mm degenerative anterolisthesis at C5-6 and C6-7.   Vertebrae: No fracture or focal bone lesion.   Cord: No primary cord lesion. See below regarding stenosis, most severe at the C4-5 level.   Posterior Fossa, vertebral arteries, paraspinal tissues: Negative   Disc levels:   The foramen magnum is widely patent. There is ordinary mild osteoarthritis of the C1-2 articulation but no encroachment upon the neural structures.   C2-3: Normal interspace.   C3-4: Spondylosis with osteophytes and a central to left-sided disc herniation. Effacement of the ventral subarachnoid space and indentation of the cord to the left of midline. AP diameter of the canal 6.9 mm in the midline. No foraminal stenosis.   C4-5: Endplate osteophytes and broad-based disc herniation with slight caudal down turning. Encroachment upon the spinal canal. AP diameter in the midline only 5.9 mm. Indentation of the cord. No compressive foraminal narrowing.   C5-6: Facet osteoarthritis worse on the right. 1-2 mm of anterolisthesis. No disc pathology. AP diameter of the canal in the midline 9.6 mm. Mild foraminal narrowing on the right.   C6-7: Facet osteoarthritis worse on the left. 1-2 mm of anterolisthesis. No disc pathology. No canal stenosis. No compressive foraminal stenosis.   C7-T1: Minimal disc bulge.  No canal or foraminal stenosis.   Compared to the study of May 2022, the findings appear similar.   IMPRESSION: 1. No appreciable change since May 2022. 2. C3-4: Spondylosis with osteophytes and a central to left-sided disc herniation.  Effacement of the ventral subarachnoid space and indentation of the cord to the left of midline. AP diameter of the canal 6.9 mm in the midline. No foraminal stenosis. 3. C4-5: Endplate osteophytes and broad-based disc herniation with slight caudal down turning. Encroachment upon the spinal canal. AP diameter of the canal only 5.9 mm. Indentation of the cord. No compressive foraminal stenosis. 4. C5-6: Facet osteoarthritis worse on the right. 1-2 mm of anterolisthesis. No disc pathology. No central canal stenosis. Mild foraminal narrowing on the right. 5. C6-7: Facet osteoarthritis worse on the left. 1-2 mm of anterolisthesis. No disc pathology. No compressive foraminal stenosis.  CT maxillofacial wo contrast (06/15/23): FINDINGS: Osseous: Mandible intact and normally aligned. Mandible tooth extraction sites, the most recent appearing on the left at the posterior bicuspid. No adverse features identified there. No acute dental finding elsewhere. Bilateral maxilla, zygoma, pterygoid, and nasal bones appear intact. Intact central skull base. Visible cervical vertebrae appear intact and aligned. Intact visible calvarium.   Orbits: Intact orbital walls. Globes and intraorbital soft tissues appear symmetric and normal.   Sinuses: Unchanged small left maxillary  sinus alveolar recess mucous retention cyst which appears inconsequential on series 7, image 48. Otherwise the tympanic cavities, paranasal sinuses, and mastoids are clear. Negative nasal cavity aside from mild septal deviation.   Soft tissues: Negative visible noncontrast larynx, pharynx, parapharyngeal spaces, retropharyngeal space (partially retropharyngeal course of the right carotid, normal variant), sublingual space, submandibular spaces, masticator and parotid spaces.   No soft tissue inflammation identified. Visible upper cervical lymph nodes are within normal limits.   Limited intracranial: Stable. Faint basal ganglia  vascular calcifications. Partially empty sella.   IMPRESSION: 1. Essentially negative noncontrast Face CT. Stable and well aerated paranasal sinuses, no evidence of sinusitis. 2. Partially empty sella, as per description on MRI Head and Orbits in June.  CT abd/pelvis (06/02/23): IMPRESSION: 1. No acute findings in the abdomen or pelvis.  External lumbar spine xray (05/14/23): FINDINGS:  Alignment is intact. 5 lumbar segments.  Intact vertebral body heights.  No acute fractures.  No destructive osseous processes.  Advanced degenerative disc disease at L5-S1 with endplate sclerosis and osteophyte formation anteriorly and posteriorly.  Facet joint arthropathy bilaterally at L5-S1 and to a lesser degree at L4-5.  The SI joints are normal. Phleboliths in the pelvis. Clips in the right upper quadrant. Moderate stool.    IMPRESSION:  Advanced degenerative disc disease at L5-S1.  Facet joint arthropathy at L5-S1 and to a lesser degree at L4-5.  Moderate fecal retention.   ***  Assessment/Plan:  Lydia Gross is a 47 y.o. female who presents for evaluation of ***. *** has a relevant medical history of ***. *** neurological examination is pertinent for ***. Available diagnostic data is significant for ***. This constellation of symptoms and objective data would most likely localize to ***. ***  PLAN: -Blood work: *** ***Vit D supplementation  -Return to clinic ***  The impression above as well as the plan as outlined below were extensively discussed with the patient (in the company of ***) who voiced understanding. All questions were answered to their satisfaction.  The patient was counseled on pertinent fall precautions per the printed material provided today, and as noted under the "Patient Instructions" section below.***  When available, results of the above investigations and possible further recommendations will be communicated to the patient via telephone/MyChart. Patient  to call office if not contacted after expected testing turnaround time.   Total time spent reviewing records, interview, history/exam, documentation, and coordination of care on day of encounter:  *** min   Thank you for allowing me to participate in patient's care.  If I can answer any additional questions, I would be pleased to do so.  Jacquelyne Balint, MD   CC: Ladora Daniel, PA-C [address] [place] [address]  CC: Referring provider: Ladora Daniel, PA-C [address] [place],  [place] [address]

## 2023-06-26 ENCOUNTER — Emergency Department (HOSPITAL_COMMUNITY)
Admission: EM | Admit: 2023-06-26 | Discharge: 2023-06-26 | Disposition: A | Discharge status: Home / Self Care | Payer: Medicaid Other | Attending: Emergency Medicine | Admitting: Emergency Medicine

## 2023-06-26 ENCOUNTER — Emergency Department (HOSPITAL_COMMUNITY): Payer: Medicaid Other

## 2023-06-26 ENCOUNTER — Other Ambulatory Visit: Payer: Self-pay

## 2023-06-26 ENCOUNTER — Encounter (HOSPITAL_COMMUNITY): Payer: Self-pay

## 2023-06-26 DIAGNOSIS — R1310 Dysphagia, unspecified: Secondary | ICD-10-CM | POA: Insufficient documentation

## 2023-06-26 DIAGNOSIS — I10 Essential (primary) hypertension: Secondary | ICD-10-CM | POA: Diagnosis not present

## 2023-06-26 DIAGNOSIS — R079 Chest pain, unspecified: Secondary | ICD-10-CM | POA: Diagnosis present

## 2023-06-26 DIAGNOSIS — R0600 Dyspnea, unspecified: Secondary | ICD-10-CM | POA: Diagnosis not present

## 2023-06-26 DIAGNOSIS — Z1152 Encounter for screening for COVID-19: Secondary | ICD-10-CM | POA: Diagnosis not present

## 2023-06-26 DIAGNOSIS — Z79899 Other long term (current) drug therapy: Secondary | ICD-10-CM | POA: Diagnosis not present

## 2023-06-26 LAB — BASIC METABOLIC PANEL
Anion gap: 9 (ref 5–15)
BUN: 14 mg/dL (ref 6–20)
CO2: 25 mmol/L (ref 22–32)
Calcium: 9.1 mg/dL (ref 8.9–10.3)
Chloride: 102 mmol/L (ref 98–111)
Creatinine, Ser: 0.82 mg/dL (ref 0.44–1.00)
GFR, Estimated: >60 mL/min (ref >60)
Glucose, Bld: 94 mg/dL (ref 70–99)
Potassium: 3.1 mmol/L — ABNORMAL LOW (ref 3.5–5.1)
Sodium: 136 mmol/L (ref 135–145)

## 2023-06-26 LAB — CBC
HCT: 39.3 % (ref 36.0–46.0)
Hemoglobin: 12.2 g/dL (ref 12.0–15.0)
MCH: 23.9 pg — ABNORMAL LOW (ref 26.0–34.0)
MCHC: 31 g/dL (ref 30.0–36.0)
MCV: 76.9 fL — ABNORMAL LOW (ref 80.0–100.0)
Platelets: 322 10*3/uL (ref 150–400)
RBC: 5.11 MIL/uL (ref 3.87–5.11)
RDW: 15.2 % (ref 11.5–15.5)
WBC: 8.8 10*3/uL (ref 4.0–10.5)
nRBC: 0 % (ref 0.0–0.2)

## 2023-06-26 LAB — HCG, QUANTITATIVE, PREGNANCY: hCG, Beta Chain, Quant, S: <1 m[IU]/mL (ref <5)

## 2023-06-26 LAB — TROPONIN I (HIGH SENSITIVITY)
Troponin I (High Sensitivity): 2 ng/L (ref <18)
Troponin I (High Sensitivity): 2 ng/L (ref <18)

## 2023-06-26 LAB — SARS CORONAVIRUS 2 BY RT PCR: SARS Coronavirus 2 by RT PCR: NEGATIVE

## 2023-06-26 MED ORDER — KETOROLAC TROMETHAMINE 15 MG/ML IJ SOLN
15.0000 mg | Freq: Once | INTRAMUSCULAR | Status: AC
  Administered 2023-06-26: 15 mg via INTRAVENOUS
  Filled 2023-06-26: qty 1

## 2023-06-26 MED ORDER — IOHEXOL 350 MG/ML SOLN
150.0000 mL | Freq: Once | INTRAVENOUS | Status: AC | PRN
  Administered 2023-06-26: 150 mL via INTRAVENOUS

## 2023-06-26 MED ORDER — ACETAMINOPHEN 500 MG PO TABS
1000.0000 mg | ORAL_TABLET | Freq: Once | ORAL | Status: DC
  Filled 2023-06-26: qty 2

## 2023-06-26 MED ORDER — ONDANSETRON HCL 4 MG/2ML IJ SOLN
4.0000 mg | Freq: Once | INTRAMUSCULAR | Status: DC
  Filled 2023-06-26: qty 2

## 2023-06-26 NOTE — ED Provider Notes (Signed)
[place] EMERGENCY DEPARTMENT AT [facility] Provider Note   CSN: [id number] Arrival date & time: 06/26/23  1512     History  Chief Complaint  Patient presents with   Chest Pain    Lydia Gross is a 47 y.o. female with PMH as listed below who presents with chest, neck, ear, and head pain. Patient reports that she has had months of CP/SOB and has been undergoing outpatient workup. She states it worsened several weeks ago and then again worsened even more over the last 2 days. It is also associated with neck pain, pain with swallowing, sensation of swelling in the neck over the last several weeks. For 2 days she has also felt pain in the right side of her head and pain in her Right ear. Denies cough, hemoptysis, abdominal pain, N/V/D/C, urinary sxs, leg swelling, h/o DVT/PE, recent travel/hospitalizations/immobilizations/surgeries, doesn't take birth control. Had been smoking tobacco but quit when she started to get sick a few months ago. SOB/Cp are worse with exertion. Has also had intermittent binocular blurry vision and spots in her vision. States she has o/p CT chest scheduled for tomorrow.    Per chart review patient has been seen in the ED 18 times since March for similar complaints, including shortness of breath, trouble swallowing, neck pain, sensation of something in her throat, chest pain, and not feeling right.  Was evaluated on 03/27/2023 in the ED and had CT soft tissue neck with contrast that demonstrated no evidence of tonsillitis, epiglottitis, active dental or periodontal disease.  Had a normal thyroid as well.  Also had an MRI brain C-spine and orbits at that encounter which demonstrated partially empty sella and narrowing of transverse sinuses, could be seen in the setting of IIH. Had neg CXRs on 03/25/23 and 04/15/23. Had unremarkable CT abd pelvis on 06/02/23 and unremarkable CT max face on 06/15/23.  Had a negative lower extremity DVT study on 03/30/2023.  Had normal  thyroid studies in June.  Has had multiple negative troponins over the last several months.  Past Medical History:  Diagnosis Date   Acid reflux    Anxiety    Chronic neck pain    Gout    Hypertension    Obesity        Home Medications Prior to Admission medications   Medication Sig Start Date End Date Taking? Authorizing Provider  FLUoxetine (PROZAC) 10 MG capsule Take 10 mg by mouth daily. 01/10/23   [provider]  hydrochlorothiazide (HYDRODIURIL) 25 MG tablet Take 25 mg by mouth every morning.     [provider]  magic mouthwash (lidocaine, diphenhydrAMINE, alum & mag hydroxide) suspension Swish and swallow 15 mLs 4 (four) times daily as needed (Gargle and swallow when you are experiencing symptoms (up to four times daily)). 03/19/23   Gailen Shelter, PA  naproxen (NAPROSYN) 125 MG/5ML suspension Take 10 mLs (250 mg total) by mouth 2 (two) times daily as needed. 03/25/23   Tilden Fossa, MD  omeprazole (PRILOSEC) 40 MG capsule Take by mouth. 01/17/23 07/16/23  [provider]  sucralfate (CARAFATE) 1 g tablet Take 1 tablet (1 g total) by mouth 4 (four) times daily -  with meals and at bedtime for 7 days. 03/11/23 03/18/23  Melene Plan, DO      Allergies    Amoxicillin    Review of Systems   Review of Systems A 10 point review of systems was performed and is negative unless otherwise reported in HPI.  Physical Exam Updated Vital Signs BP (!) 145/94   Pulse 83   Temp 98.4 F (36.9 C) (Oral)   Resp 16   Ht 5\' 10"  (1.778 m)   Wt (!) 137.9 kg   LMP 06/03/2023 (Approximate)   SpO2 100%   BMI 43.62 kg/m  Physical Exam General: Normal appearing obese female, lying in bed.  HEENT: PERRLA, EOMI, no nystagmus, sclera anicteric, MMM, trachea midline.  Clear oropharynx with symmetric tonsillar pillars and uvula midline.  Thyroid not palpable on exam, no goiter noted.  No anterior cervical lymphadenopathy.  Trachea is midline.  No induration, fluctuance,  submandibular swelling. Cardiology: RRR, no murmurs/rubs/gallops. BL radial and DP pulses equal bilaterally.  Resp: Normal respiratory rate and effort. CTAB, no wheezes, rhonchi, crackles.  Abd: Soft, non-tender, non-distended. No rebound tenderness or guarding.  GU: Deferred. MSK: No peripheral edema or signs of trauma.  Skin: warm, dry Neuro: A&Ox4, CNs II-XII grossly intact. MAEs. Sensation grossly intact.  Psych: Normal mood and affect.   ED Results / Procedures / Treatments   Labs (all labs ordered are listed, but only abnormal results are displayed) Labs Reviewed  BASIC METABOLIC PANEL - Abnormal; Notable for the following components:      Result Value   Potassium 3.1 (*)    All other components within normal limits  CBC - Abnormal; Notable for the following components:   MCV 76.9 (*)    MCH 23.9 (*)    All other components within normal limits  SARS CORONAVIRUS 2 BY RT PCR  HCG, QUANTITATIVE, PREGNANCY  TROPONIN I (HIGH SENSITIVITY)  TROPONIN I (HIGH SENSITIVITY)    EKG EKG Interpretation Date/Time:  Wednesday June 26 2023 15:44:13 EDT Ventricular Rate:  88 PR Interval:  166 QRS Duration:  87 QT Interval:  367 QTC Calculation: 444 R Axis:   9  Text Interpretation: Sinus rhythm Low voltage, precordial leads Abnormal R-wave progression, early transition Borderline T abnormalities, diffuse leads Confirmed by Vivi Barrack [phone]) on 06/26/2023 4:59:26 PM  Radiology CT Angio Chest PE W and/or Wo Contrast  Result Date: 06/26/2023 CLINICAL DATA:  Chest, neck, ear, and head pain EXAM: CT ANGIOGRAPHY CHEST WITH CONTRAST TECHNIQUE: Multidetector CT imaging of the chest was performed using the standard protocol during bolus administration of intravenous contrast. Multiplanar CT image reconstructions and MIPs were obtained to evaluate the vascular anatomy. RADIATION DOSE REDUCTION: This exam was performed according to the departmental dose-optimization program which  includes automated exposure control, adjustment of the mA and/or kV according to patient size and/or use of iterative reconstruction technique. CONTRAST:  OMNIPAQUE IOHEXOL 350 MG/ML SOLN COMPARISON:  Same day chest radiograph, CTA chest 01/05/2023 FINDINGS: Cardiovascular: The heart is not enlarged. There is no pericardial effusion. There is no evidence of pulmonary embolism to the segmental level. The distal pulmonary arteries are not well evaluated. The main pulmonary artery is borderline enlarged suggesting pulmonary hypertension. The thoracic aorta is unremarkable. Mediastinum/Nodes: The thyroid is unremarkable. The esophagus is grossly unremarkable. There is a small hiatal hernia. There is no mediastinal, hilar, or axillary lymphadenopathy. Lungs/Pleura: The trachea and central airways are patent. The lungs are clear, with no focal consolidation or pulmonary edema. There is no pleural effusion or pneumothorax. There are no suspicious nodules. Upper Abdomen: Cholecystectomy clips are noted. The imaged portions of the upper abdominal viscera are otherwise unremarkable. Musculoskeletal: There is no acute osseous abnormality or suspicious osseous lesion. Review of the MIP images confirms the above findings. IMPRESSION: 1. No evidence of  pulmonary embolism to the segmental level. The distal pulmonary arteries are suboptimally evaluated. 2. No other evidence of acute cardiopulmonary pathology. 3. Borderline enlarged main pulmonary artery suggesting pulmonary hypertension. 4. Small hiatal hernia. Electronically Signed   By: Lesia Hausen M.D.   On: 06/26/2023 20:06   CT ANGIO HEAD NECK W WO CM  Result Date: 06/26/2023 CLINICAL DATA:  Acute vision changes EXAM: CT ANGIOGRAPHY HEAD AND NECK WITH AND WITHOUT CONTRAST TECHNIQUE: Multidetector CT imaging of the head and neck was performed using the standard protocol during bolus administration of intravenous contrast. Multiplanar CT image reconstructions and MIPs  were obtained to evaluate the vascular anatomy. Carotid stenosis measurements (when applicable) are obtained utilizing NASCET criteria, using the distal internal carotid diameter as the denominator. RADIATION DOSE REDUCTION: This exam was performed according to the departmental dose-optimization program which includes automated exposure control, adjustment of the mA and/or kV according to patient size and/or use of iterative reconstruction technique. CONTRAST:  OMNIPAQUE IOHEXOL 350 MG/ML SOLN COMPARISON:  Head CT 01/22/2023 FINDINGS: CT HEAD FINDINGS Brain: There is no mass, hemorrhage or extra-axial collection. Normal appearance of the white matter with preserved gray-white differentiation. Normal CSF spaces. Vascular: No hyperdense vessel or unexpected calcification. Skull: Normal Sinuses/Orbits: Paranasal sinuses are clear. No mastoid effusion. Normal orbits. Other: None Review of the MIP images confirms the above findings CTA NECK FINDINGS Aortic arch: Normal with standard branching pattern. Right carotid system: Normal. Left carotid system: Normal Vertebral arteries: No bony spinal canal stenosis or acute abnormality. Skeleton: Codominant system. Both vertebral arteries are normal to the skull base. Other neck: Normal Upper chest: Clear Review of the MIP images confirms the above findings CTA HEAD FINDINGS POSTERIOR CIRCULATION: --Vertebral arteries: Normal --Inferior cerebellar arteries: Normal. --Basilar artery: Normal. --Superior cerebellar arteries: Normal. --Posterior cerebral arteries: Normal. ANTERIOR CIRCULATION: --Intracranial internal carotid arteries: Normal. --Anterior cerebral arteries (ACA): Normal. --Middle cerebral arteries (MCA): Normal. Venous sinuses: As permitted by contrast timing, patent. Anatomic variants: None Review of the MIP images confirms the above findings IMPRESSION: Normal CTA of the head and neck. Electronically Signed   By: Deatra Robinson M.D.   On: 06/26/2023 19:55    DG Chest 2 View  Result Date: 06/26/2023 CLINICAL DATA:  Chest pain, midsternal to left-sided, difficulty breathing EXAM: CHEST - 2 VIEW COMPARISON:  04/15/2023 FINDINGS: Cardiac and mediastinal contours are within normal limits. No focal pulmonary opacity. No pleural effusion or pneumothorax. No acute osseous abnormality. IMPRESSION: No acute cardiopulmonary process. Electronically Signed   By: Wiliam Ke M.D.   On: 06/26/2023 16:50    Procedures Procedures    Medications Ordered in ED Medications  acetaminophen (TYLENOL) tablet 1,000 mg (1,000 mg Oral Patient Refused/Not Given 06/26/23 1758)  ondansetron (ZOFRAN) injection 4 mg (0 mg Intravenous Hold 06/26/23 1757)  ketorolac (TORADOL) 15 MG/ML injection 15 mg (15 mg Intravenous Given 06/26/23 1757)  iohexol (OMNIPAQUE) 350 MG/ML injection 150 mL (150 mLs Intravenous Contrast Given 06/26/23 1834)    ED Course/ Medical Decision Making/ A&P                          Medical Decision Making Amount and/or Complexity of Data Reviewed Labs: ordered. Radiology: ordered. Decision-making details documented in ED Course.  Risk OTC drugs. Prescription drug management.    This patient presents to the ED for concern of CP, SOB, dyspnea; this involves an extensive number of treatment options, and is a complaint that carries with it  a high risk of complications and morbidity.  I considered the following differential and admission for this acute, potentially life threatening condition.   MDM:    Patient with several different complaints for which she has been to the emergency department 18 times in the last 6 months.  She has had negative evaluations of her thyroid, but could consider goiter though I do not feel one on exam.  Given chest pain, dyspnea need to consider PE and in her extensive workup she has not yet had a CT of her chest, will obtain a CT PE today.  She also reports pain that goes up into her neck and the right side of her head  with intermittent visual changes.  She did have an MRI several months ago that demonstrated possible evidence of IIH, however I do believe cavernous sinus thrombosis, arterial dissection, or arterial embolism are also on the differential.  Low concern for ICH or ischemic CVA with no consistent or new focal neurodeficits.  Will also obtain CT head and neck for further evaluation.  She does not have any arrhythmia or evidence of ischemia on her EKG must consider atypical ACS though she has had many negative troponins.  She has had no cough or fever to indicate pneumonia or bronchitis.  No wheezing to indicate COPD or asthma.  Could consider reflux/GERD, valvular heart disease, or malignancy also on the differential.  Clinical Course as of 06/26/23 2251  Wed Jun 26, 2023  1945 Lab workup including BMP, troponin, covid, CBC, and HCG are unremarkable in context of presentation. [HN]  2010 CT Angio Chest PE W and/or Wo Contrast 1. No evidence of pulmonary embolism to the segmental level. The distal pulmonary arteries are suboptimally evaluated. 2. No other evidence of acute cardiopulmonary pathology. 3. Borderline enlarged main pulmonary artery suggesting pulmonary hypertension. 4. Small hiatal hernia.   [HN]  2010 CT ANGIO HEAD NECK W WO CM Normal CTA of the head and neck. [HN]  2012 Only relevant finding on patient's workup is borderline enlarged main pulmonary artery suggesting pulmonary hypertension.  This could explain this patient's exertional dyspnea for which no cause has yet been found.  Otherwise patient is hemodynamically stable and well-appearing.  She has not yet been seen by gastroenterology for her swallowing difficulties and I will recommend that she follow-up with pulmonology for new possible diagnosis pulmonary hypertension and with gastroenterology.  When I discussed with the patient she states that she has already been undergoing workup with GI and had an endoscopy which she was told  was normal.  Was supposed to have a manometry test done today but could not do it because it was so painful.  She also states that her PCP has already placed a referral for her to pulmonology.  I encourage patient to continue workup and follow-up with both GI and pulmonology.  Patient is otherwise stable for discharge.  She reports understanding and is given discharge instructions return precautions, all questions answered to patient satisfaction. [HN]    Clinical Course User Index [HN] Loetta Rough, MD    Labs: I Ordered, and personally interpreted labs.  The pertinent results include:  those litsed above  Imaging Studies ordered: I ordered imaging studies including CTA H&N, CT PE I independently visualized and interpreted imaging. I agree with the radiologist interpretation  Additional history obtained from chart review.   Cardiac Monitoring: The patient was maintained on a cardiac monitor.  I personally viewed and interpreted the cardiac monitored which showed  an underlying rhythm of: NSR  Reevaluation: After the interventions noted above, I reevaluated the patient and found that they have :stayed the same  Social Determinants of Health: Lives independently  Disposition:  DC w/ discharge instructions/return precautions. All questions answered to patient's satisfaction.    Co morbidities that complicate the patient evaluation  Past Medical History:  Diagnosis Date   Acid reflux    Anxiety    Chronic neck pain    Gout    Hypertension    Obesity      Medicines Meds ordered this encounter  Medications   acetaminophen (TYLENOL) tablet 1,000 mg   ketorolac (TORADOL) 15 MG/ML injection 15 mg   ondansetron (ZOFRAN) injection 4 mg   iohexol (OMNIPAQUE) 350 MG/ML injection 150 mL    I have reviewed the patients home medicines and have made adjustments as needed  Problem List / ED Course: Problem List Items Addressed This Visit   None Visit Diagnoses     Dyspnea,  unspecified type    -  Primary   Pain with swallowing                       This note was created using dictation software, which may contain spelling or grammatical errors.    Loetta Rough, MD 06/26/23 2252

## 2023-06-26 NOTE — ED Notes (Signed)
Pt refused ordered zofran, states she is not nauseous at this time. Pt also having difficulty swallowing ordered tylenol, pt was able to swallow approx half dose, refused to take the rest. MD aware.

## 2023-06-26 NOTE — Discharge Instructions (Signed)
Thank you for coming to [facility] Emergency Department. You were seen for pain with swallowing, and shortness of breath. We did an exam, labs, and imaging, and these showed possible pulmonary hypertension. More workup would be needed for a definitive diagnosis, but this could explain your symptoms. Please follow up with pulmonology (lung doctor) for further evaluation and workup. Please also follow up with a gastroenterologist for your pain with swallowing. Please call these services tomorrow morning to make an appointment. Your primary care provider can help you with referrals if you are unable to make an appointment.   Do not hesitate to return to the ED or call 911 if you experience: -Worsening symptoms -Lightheadedness, passing out -Fevers/chills -Anything else that concerns you

## 2023-06-26 NOTE — ED Triage Notes (Signed)
Pt complaining of chest, neck, ear, and head pain. Pt states that she can not breath, able to speak in full sentences in triage.

## 2023-06-27 ENCOUNTER — Telehealth: Payer: Self-pay | Admitting: Pulmonary Disease

## 2023-06-27 NOTE — Telephone Encounter (Signed)
Patient states would like to switch providers. Patient needs an appointment. Patient phone number is [phone].

## 2023-06-27 NOTE — Telephone Encounter (Signed)
PT calling again saying she needs an appt "like yesterday." I made a HFU appt w/Dr, Wynona Neat and spoke to her about changing Dr'sas mentioned in the encounter.  She said the original referral was not for sleep. I corrected her and said that it WAS for sleep so she DID have the right Dr.   She had an ER visit for pulm issues and I assured her Dr, Wynona Neat was the best possible Dr. For her issues aftewr reading ER report. So there is no need to switch (She said he never got back to her.)  She needs changes in her BP meds but  Dr. Val Eagle can not change that for her since he did not issue that med. NFN.

## 2023-06-27 NOTE — Progress Notes (Signed)
Initial [facility] clinic note  L[facility] MRN: [id number] DOB: 1976-05-15  Referring provider: Ladora Daniel, PA-C  Primary care provider: Ladora Daniel, PA-C  Reason for consult:  headaches  Subjective:  This is [name], a 47 y.o. left-handed female with a medical history of HTN, GERD, anxiety, IIH who presents to [facility] with headaches. The patient is alone today.  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 She was initially having headaches, but essentially was not having headaches until recently. She never had vision changes.   Patient then got sick in 12/2022. She describes not being able to breath when laying down. She was not able to swallow. She also had chills. She was taking GI medications in 01/2023 (sucralfate and omeprazole). She started having a headache soon after. She describes the headache as her whole head. It was a stabbing pain. It would get worse if she was laying down. She then started having a gritty sensation in her right eye.   She stopped taking the medication (sucralfate and omeprazole) in 03/2023. Her headaches started going away in 04/2023.She has no significant headaches since 05/2023. She will occasionally have headaches when she wakes up. When she gets up, this will improve. She endorses neck pain. She states she needs surgery for cervical spine problems. She also mentions she has tried PT in the past for her neck without significant success.  She still has bad GERD. She took sucralfate one day last week and her symptoms came right back. She did not take it again and symptoms went away again.   Patient currently has floaters. It was worse initially, with flashing lights and kleidoscope vision. She had currently has a few black floaters. When she has a headache, she denies photophobia, phonophobia, nausea, or vomiting. She again denies vision changes.  Patient has been seen by [facility], including recently, and was told her  pressure was normal.  Patient saw [facility] at [facility] previously for headaches, most recently on 03/29/23. MRI brain showed partially empty sella concerning for IIH. Lumbar puncture was recommended at last clinic visit and completed on 04/19/23. Opening pressure was 23.6 cm H2O. Closing pressure was 17.4 cm of H2O after 21 cc of CSF was drained. CSF analysis was normal.   She was started on Diamox 250 mg once daily. She did not think it worked. She took diamox < 1 month. Patient does not think she is having headaches now. She thinks the shooting pains in her head was due to the infection that she had previously.   She does get some pain on the right side of her face when she lays on her right side, but not her left side.  Patient went to ED on 06/15/23 for right head pain.  MEDICATIONS:  Outpatient Encounter Medications as of 07/04/2023  Medication Sig   hydrochlorothiazide (HYDRODIURIL) 25 MG tablet Take 25 mg by mouth every morning.    meloxicam (MOBIC) 15 MG tablet Take by mouth.   olmesartan (BENICAR) 5 MG tablet Take 20 mg by mouth daily.   pantoprazole (PROTONIX) 40 MG tablet Take by mouth.   [DISCONTINUED] FLUoxetine (PROZAC) 10 MG capsule Take 10 mg by mouth daily. (Patient not taking: Reported on 07/04/2023)   [DISCONTINUED] magic mouthwash (lidocaine, diphenhydrAMINE, alum & mag hydroxide) suspension Swish and swallow 15 mLs 4 (four) times daily as needed (Gargle and swallow when you are experiencing symptoms (up to four times daily)). (Patient not taking: Reported on 07/04/2023)   [  DISCONTINUED] naproxen (NAPROSYN) 125 MG/5ML suspension Take 10 mLs (250 mg total) by mouth 2 (two) times daily as needed. (Patient not taking: Reported on 07/04/2023)   [DISCONTINUED] omeprazole (PRILOSEC) 40 MG capsule Take by mouth. (Patient not taking: Reported on 07/04/2023)   [DISCONTINUED] sucralfate (CARAFATE) 1 g tablet Take 1 tablet (1 g total) by mouth 4 (four) times daily -  with meals and  at bedtime for 7 days. (Patient not taking: Reported on 07/04/2023)   No facility-administered encounter medications on file as of 07/04/2023.    PAST MEDICAL HISTORY: Past Medical History:  Diagnosis Date   Acid reflux    Anxiety    Chronic neck pain    Gout    Hypertension    Obesity     PAST SURGICAL HISTORY: Past Surgical History:  Procedure Laterality Date   CESAREAN SECTION     CHOLECYSTECTOMY     DENTAL RESTORATION/EXTRACTION WITH X-RAY  03/05/2023   L lower molar extraction   TUBAL LIGATION      ALLERGIES: Allergies  Allergen Reactions   Amoxicillin Hives    FAMILY HISTORY: Family History  Problem Relation Age of Onset   Hypertension Mother    Other Daughter        IIH   Thyroid disease Neg Hx     SOCIAL HISTORY: Social History   Tobacco Use   Smoking status: Former    Types: Cigarettes    Passive exposure: Never   Tobacco comments:    Stopped over a month ago   Substance Use Topics   Alcohol use: Not Currently    Comment: little   Drug use: No   Social History   Social History Narrative   Are you right handed or left handed? Left handed    Are you currently employed ? No    What is your current occupation?   Do you live at home alone? No    Who lives with you? With Daughter    What type of home do you live in: 1 story or 2 story? Lives in a two story home.        Objective:  Vital Signs:  BP 138/86   Pulse 94   Ht 5\' 10"  (1.778 m)   Wt 300 lb (136.1 kg)   LMP 06/03/2023 (Approximate)   SpO2 97%   BMI 43.05 kg/m   General: No acute distress.  Patient appears well-groomed.   Head:  Normocephalic/atraumatic Eyes:  fundi examined. Disc margins clear. No obvious papilledema. Neck: supple, positive for paraspinal tenderness  Neurological Exam: Mental status: alert and oriented, speech fluent and not dysarthric, language intact.  Cranial nerves: CN I: not tested CN II: pupils equal, round and reactive to light, visual fields  intact CN III, IV, VI:  full range of motion, no nystagmus, no ptosis CN V: facial sensation intact. CN VII: upper and lower face symmetric CN VIII: hearing intact CN IX, X: uvula midline CN XI: sternocleidomastoid and trapezius muscles intact CN XII: tongue midline  Bulk & Tone: normal, no fasciculations. Motor:  muscle strength 5/5 throughout Deep Tendon Reflexes:  2+ throughout.   Sensation:  Light touch sensation intact. Finger to nose testing:  Without dysmetria.     Gait:  Normal station and stride.  Romberg negative.   Labs and Imaging review: Internal labs: Lab Results  Component Value Date   TSH 0.457 03/27/2023   06/15/23: CBC significant for Hb 10.6, MCV 75.8 CMP significant for K 3.2, glucose 109  External labs: CSF (04/19/23): Opening pressure 23.6 cm H2O Routine analysis: 2 R, 2 W, 31 P, 66 G Cytology negative ACE negative Culture negative Lactic acid wnl IgG index wnl OCB - 0 (normal)  Vit D (05/22/23): 13.5  Imaging: MRI brain and orbits w/wo contrast (03/27/23): FINDINGS: MRI HEAD FINDINGS   Brain: No acute infarct or hemorrhage. No hydrocephalus or extra-axial collection. No foci of abnormal susceptibility. No mass or abnormal enhancement. Partially empty sella.   Vascular: Narrowing of the transverse sinuses near the transverse-sigmoid junction.   Skull and upper cervical spine: Normal marrow signal and enhancement.   Other: None.   MRI ORBITS FINDINGS   Orbits: No traumatic or inflammatory finding. Globes, optic nerves, orbital fat, extraocular muscles, vascular structures, and lacrimal glands are normal.   Visualized sinuses: Clear.   Soft tissues: Unremarkable.   IMPRESSION: 1. Partially empty sella and narrowing of the transverse sinuses near the transverse-sigmoid junction. These findings can be seen in the setting of idiopathic intracranial hypertension. 2. Otherwise, normal MRI of the brain and orbits. No evidence of optic  neuritis.  MRI cervical spine w/wo contrast (03/27/23): FINDINGS: Alignment: Straightening and mild kyphotic curvature in the cervical region. 1-2 mm degenerative anterolisthesis at C5-6 and C6-7.   Vertebrae: No fracture or focal bone lesion.   Cord: No primary cord lesion. See below regarding stenosis, most severe at the C4-5 level.   Posterior Fossa, vertebral arteries, paraspinal tissues: Negative   Disc levels:   The foramen magnum is widely patent. There is ordinary mild osteoarthritis of the C1-2 articulation but no encroachment upon the neural structures.   C2-3: Normal interspace.   C3-4: Spondylosis with osteophytes and a central to left-sided disc herniation. Effacement of the ventral subarachnoid space and indentation of the cord to the left of midline. AP diameter of the canal 6.9 mm in the midline. No foraminal stenosis.   C4-5: Endplate osteophytes and broad-based disc herniation with slight caudal down turning. Encroachment upon the spinal canal. AP diameter in the midline only 5.9 mm. Indentation of the cord. No compressive foraminal narrowing.   C5-6: Facet osteoarthritis worse on the right. 1-2 mm of anterolisthesis. No disc pathology. AP diameter of the canal in the midline 9.6 mm. Mild foraminal narrowing on the right.   C6-7: Facet osteoarthritis worse on the left. 1-2 mm of anterolisthesis. No disc pathology. No canal stenosis. No compressive foraminal stenosis.   C7-T1: Minimal disc bulge.  No canal or foraminal stenosis.   Compared to the study of May 2022, the findings appear similar.   IMPRESSION: 1. No appreciable change since May 2022. 2. C3-4: Spondylosis with osteophytes and a central to left-sided disc herniation. Effacement of the ventral subarachnoid space and indentation of the cord to the left of midline. AP diameter of the canal 6.9 mm in the midline. No foraminal stenosis. 3. C4-5: Endplate osteophytes and broad-based disc  herniation with slight caudal down turning. Encroachment upon the spinal canal. AP diameter of the canal only 5.9 mm. Indentation of the cord. No compressive foraminal stenosis. 4. C5-6: Facet osteoarthritis worse on the right. 1-2 mm of anterolisthesis. No disc pathology. No central canal stenosis. Mild foraminal narrowing on the right. 5. C6-7: Facet osteoarthritis worse on the left. 1-2 mm of anterolisthesis. No disc pathology. No compressive foraminal stenosis.  CT maxillofacial wo contrast (06/15/23): FINDINGS: Osseous: Mandible intact and normally aligned. Mandible tooth extraction sites, the most recent appearing on the left at the posterior bicuspid. No adverse features  identified there. No acute dental finding elsewhere. Bilateral maxilla, zygoma, pterygoid, and nasal bones appear intact. Intact central skull base. Visible cervical vertebrae appear intact and aligned. Intact visible calvarium.   Orbits: Intact orbital walls. Globes and intraorbital soft tissues appear symmetric and normal.   Sinuses: Unchanged small left maxillary sinus alveolar recess mucous retention cyst which appears inconsequential on series 7, image 48. Otherwise the tympanic cavities, paranasal sinuses, and mastoids are clear. Negative nasal cavity aside from mild septal deviation.   Soft tissues: Negative visible noncontrast larynx, pharynx, parapharyngeal spaces, retropharyngeal space (partially retropharyngeal course of the right carotid, normal variant), sublingual space, submandibular spaces, masticator and parotid spaces.   No soft tissue inflammation identified. Visible upper cervical lymph nodes are within normal limits.   Limited intracranial: Stable. Faint basal ganglia vascular calcifications. Partially empty sella.   IMPRESSION: 1. Essentially negative noncontrast Face CT. Stable and well aerated paranasal sinuses, no evidence of sinusitis. 2. Partially empty sella, as per  description on MRI Head and Orbits in June.  CT abd/pelvis (06/02/23): IMPRESSION: 1. No acute findings in the abdomen or pelvis.  External lumbar spine xray (05/14/23): FINDINGS:  Alignment is intact. 5 lumbar segments.  Intact vertebral body heights.  No acute fractures.  No destructive osseous processes.  Advanced degenerative disc disease at L5-S1 with endplate sclerosis and osteophyte formation anteriorly and posteriorly.  Facet joint arthropathy bilaterally at L5-S1 and to a lesser degree at L4-5.  The SI joints are normal. Phleboliths in the pelvis. Clips in the right upper quadrant. Moderate stool.    IMPRESSION:  Advanced degenerative disc disease at L5-S1.  Facet joint arthropathy at L5-S1 and to a lesser degree at L4-5.  Moderate fecal retention.    Assessment/Plan:  Lydia [facility] is a 47 y.o. female who presents for evaluation of headaches. She has a relevant medical history of HTN, GERD, anxiety, IIH. Her neurological examination is essentially normal, including non-dilated fundoscopy. Available diagnostic data is significant for MRI brain showing partially empty sella. LP showed an opening pressure of 23 cm H20. Patient currently has no headaches or vision changes. She is adamant that headaches were secondary to GI medications, specifically sucralfate, as they only occurred when she took this medication. She states she has seen ophtho multiple times with no papilledema seen. I will get these records for review. She tried Diamox (only 250 mg once daily) but did not feel it helped. She does not want to take medication at this time, but wants to make sure it is safe to not take medication. Her MRI brain was non-specific and LP opening pressure was borderline. Given that she denies HA or vision changes, and there appears to be no papilledema, I think it is reasonable to monitor symptoms.   PLAN: -Blood work: B1, B12 -Continue Vit D supplementation -Discussed Topamax.  Patient prefers to monitor symptoms since she is not having headaches or vision loss currently. -Will get records from [facility] - dilated fundoscopy looking for papilledema  -Return to clinic in 6 months  The impression above as well as the plan as outlined below were extensively discussed with the patient who voiced understanding. All questions were answered to their satisfaction.  When available, results of the above investigations and possible further recommendations will be communicated to the patient via telephone/MyChart. Patient to call office if not contacted after expected testing turnaround time.   Total time spent reviewing records, interview, history/exam, documentation, and coordination of care on day of encounter:  45 min   Thank you for allowing me to participate in patient's care.  If I can answer any additional questions, I would be pleased to do so.  Jacquelyne Balint, MD   CC: Lydia Daniel, PA-C [address] [place] [address]  CC: Referring provider: Ladora Daniel, PA-C [address] [place],  [place] [address]

## 2023-06-28 ENCOUNTER — Ambulatory Visit: Payer: Medicaid Other | Admitting: Neurology

## 2023-07-04 ENCOUNTER — Ambulatory Visit: Payer: Medicaid Other | Admitting: Neurology

## 2023-07-04 ENCOUNTER — Other Ambulatory Visit (INDEPENDENT_AMBULATORY_CARE_PROVIDER_SITE_OTHER): Payer: Medicaid Other

## 2023-07-04 ENCOUNTER — Encounter: Payer: Self-pay | Admitting: Neurology

## 2023-07-04 VITALS — BP 138/86 | HR 94 | Ht 70.0 in | Wt 300.0 lb

## 2023-07-04 DIAGNOSIS — G8929 Other chronic pain: Secondary | ICD-10-CM

## 2023-07-04 DIAGNOSIS — R519 Headache, unspecified: Secondary | ICD-10-CM | POA: Diagnosis not present

## 2023-07-04 DIAGNOSIS — G932 Benign intracranial hypertension: Secondary | ICD-10-CM

## 2023-07-04 DIAGNOSIS — M542 Cervicalgia: Secondary | ICD-10-CM | POA: Diagnosis not present

## 2023-07-05 ENCOUNTER — Emergency Department (HOSPITAL_BASED_OUTPATIENT_CLINIC_OR_DEPARTMENT_OTHER): Payer: Medicaid Other

## 2023-07-05 ENCOUNTER — Emergency Department (HOSPITAL_BASED_OUTPATIENT_CLINIC_OR_DEPARTMENT_OTHER)
Admission: EM | Admit: 2023-07-05 | Discharge: 2023-07-05 | Disposition: A | Discharge status: Home / Self Care | Payer: Medicaid Other | Attending: Emergency Medicine | Admitting: Emergency Medicine

## 2023-07-05 ENCOUNTER — Other Ambulatory Visit: Payer: Self-pay

## 2023-07-05 DIAGNOSIS — M79605 Pain in left leg: Secondary | ICD-10-CM | POA: Diagnosis present

## 2023-07-05 DIAGNOSIS — M79602 Pain in left arm: Secondary | ICD-10-CM | POA: Diagnosis not present

## 2023-07-05 NOTE — ED Notes (Signed)
Pt given discharge instructions. Opportunities given for questions. Pt verbalizes understanding. Stone,Heather R, RN 

## 2023-07-05 NOTE — ED Triage Notes (Signed)
Pt reports momentary left arm and leg pain for past month that comes and goes in multiple different areas.  AAOx4 NAD in triage.

## 2023-07-05 NOTE — Discharge Instructions (Signed)
Please follow-up with your primary care provider regarding recent symptoms and ER visit.  Today your ultrasound was negative for blood clots.  Please monitor your symptoms and if symptoms change or worsen please return to ER.

## 2023-07-05 NOTE — ED Provider Notes (Signed)
[place] EMERGENCY DEPARTMENT AT [facility] Provider Note   CSN: [id number] Arrival date & time: 07/05/23  1150     History  Chief Complaint  Patient presents with   Leg Pain    Lydia [facility] is a 47 y.o. female history of GERD, adjustment disorder with depressed mood presented for left leg and left arm pain that patient is worried about blood clots.  Patient denies swelling to these extremities but notes that this morning she saw her vein in her left hand "spasm"and he became concerned for blood clots.  Patient denies calf tenderness, recent travel/hospitalization/surgery, estrogen use, personal history of cancer, leg swelling/arm swelling, decrease sensation/motor skills, skin color changes.  Patient has not taken anything for her extremity pain.  Patient denies chest pain, shortness of breath, recent illness, recent trauma or falls, recent IVs  Home Medications Prior to Admission medications   Medication Sig Start Date End Date Taking? Authorizing Provider  hydrochlorothiazide (HYDRODIURIL) 25 MG tablet Take 25 mg by mouth every morning.     [provider]  meloxicam (MOBIC) 15 MG tablet Take by mouth. 06/05/23   [provider]  olmesartan (BENICAR) 5 MG tablet Take 20 mg by mouth daily. 02/26/23   [provider]  pantoprazole (PROTONIX) 40 MG tablet Take by mouth. 07/02/23 06/26/24  [provider]      Allergies    Amoxicillin    Review of Systems   Review of Systems  Physical Exam Updated Vital Signs BP 121/84 (BP Location: Right Arm)   Pulse 92   Temp 97.9 F (36.6 C) (Oral)   Resp 18   Ht 5\' 10"  (1.778 m)   Wt (!) 137 kg   LMP 07/01/2023 (Approximate)   SpO2 99%   BMI 43.33 kg/m  Physical Exam Vitals reviewed.  Constitutional:      General: She is not in acute distress.    Comments: Resting comfortably on phone  HENT:     Head: Normocephalic and atraumatic.  Eyes:     Extraocular Movements: Extraocular  movements intact.     Conjunctiva/sclera: Conjunctivae normal.     Pupils: Pupils are equal, round, and reactive to light.  Cardiovascular:     Rate and Rhythm: Normal rate and regular rhythm.     Pulses: Normal pulses.     Heart sounds: Normal heart sounds.     Comments: 2+ bilateral radial/dorsalis pedis pulses with regular rate Pulmonary:     Effort: Pulmonary effort is normal. No respiratory distress.     Breath sounds: Normal breath sounds.  Abdominal:     Palpations: Abdomen is soft.     Tenderness: There is no abdominal tenderness. There is no guarding or rebound.  Musculoskeletal:        General: No tenderness. Normal range of motion.     Cervical back: Normal range of motion and neck supple.     Right lower leg: No edema.     Left lower leg: No edema.     Comments: 5 out of 5 bilateral grip/leg extension strength No calf swelling or tenderness noted on exam No tenderness noted to upper extremities either All 4 extremities appear to be equal and symmetrical in size  Skin:    General: Skin is warm and dry.     Capillary Refill: Capillary refill takes less than 2 seconds.     Comments: No overlying skin color changes  Neurological:     General: No focal deficit present.  Mental Status: She is alert and oriented to person, place, and time.     Comments: Sensation intact in all 4 limbs  Psychiatric:        Mood and Affect: Mood normal.     ED Results / Procedures / Treatments   Labs (all labs ordered are listed, but only abnormal results are displayed) Labs Reviewed - No data to display  EKG None  Radiology No results found.  Procedures Procedures    Medications Ordered in ED Medications - No data to display  ED Course/ Medical Decision Making/ A&P                                 Medical Decision Making  Lydia Gross 47 y.o. presented today for blood clots and left arm left leg. Working DDx that I considered at this time includes, but not  limited to, DVT, MSK, arthritis, trauma, fracture.  R/o DDx: DVT,  arthritis, trauma, fracture: These are considered less likely due to history of present illness, physical exam, labs/imaging findings  Review of prior external notes: 06/26/2023 ED  Unique Tests and My Interpretation:  Left upper extremity ultrasound: Unremarkable Left lower extremity ultrasound: Unremarkable  Discussion with Independent Historian: None  Discussion of Management of Tests: None  Risk: Low: based on diagnostic testing/clinical impression and treatment plan  Risk Stratification Score: Wells score for DVT: 0  Staffed with Trifan, MD  Plan: On exam patient was in no acute distress with stable vitals.  Patient's physical exam was unremarkable.  Patient states that she went to have an ultrasound done to rule out blood clots.  Patient's current Wells score is 0 and so I have a very low suspicion of blood clots.  I have suspect patient's pain is MSK in nature.  I have voiced this to the patient and after extensive conversation we agreed to proceed with ultrasound to rule out blood clots likely discharge if negative follow-up with primary care provider.  Ultrasounds were ultimately negative and so patient will be discharged primary care follow-up.  I encouraged patient to monitor her symptoms and if symptoms change or worsen to return to ER.  Patient was given return precautions. Patient stable for discharge at this time.  Patient verbalized understanding of plan.         Final Clinical Impression(s) / ED Diagnoses Final diagnoses:  None    Rx / DC Orders ED Discharge Orders     None         Remi Deter 07/05/23 1508    Terald Sleeper, MD 07/05/23 [phone]

## 2023-07-09 LAB — VITAMIN B1: Thiamine: 89.2 nmol/L (ref 66.5–200.0)

## 2023-07-09 LAB — VITAMIN B12: Vitamin B-12: 466 pg/mL (ref 232–1245)

## 2023-07-10 ENCOUNTER — Other Ambulatory Visit: Payer: Self-pay

## 2023-07-10 ENCOUNTER — Emergency Department (HOSPITAL_COMMUNITY): Payer: No Typology Code available for payment source

## 2023-07-10 ENCOUNTER — Emergency Department (HOSPITAL_COMMUNITY)
Admission: EM | Admit: 2023-07-10 | Discharge: 2023-07-10 | Disposition: A | Discharge status: Home / Self Care | Payer: No Typology Code available for payment source | Attending: Emergency Medicine | Admitting: Emergency Medicine

## 2023-07-10 DIAGNOSIS — Y92410 Unspecified street and highway as the place of occurrence of the external cause: Secondary | ICD-10-CM | POA: Diagnosis not present

## 2023-07-10 DIAGNOSIS — Z79899 Other long term (current) drug therapy: Secondary | ICD-10-CM | POA: Diagnosis not present

## 2023-07-10 DIAGNOSIS — S161XXA Strain of muscle, fascia and tendon at neck level, initial encounter: Secondary | ICD-10-CM | POA: Diagnosis not present

## 2023-07-10 DIAGNOSIS — S199XXA Unspecified injury of neck, initial encounter: Secondary | ICD-10-CM | POA: Diagnosis present

## 2023-07-10 DIAGNOSIS — I10 Essential (primary) hypertension: Secondary | ICD-10-CM | POA: Insufficient documentation

## 2023-07-10 DIAGNOSIS — R519 Headache, unspecified: Secondary | ICD-10-CM | POA: Diagnosis not present

## 2023-07-10 MED ORDER — KETOROLAC TROMETHAMINE 15 MG/ML IJ SOLN
15.0000 mg | Freq: Once | INTRAMUSCULAR | Status: AC
  Administered 2023-07-10: 15 mg via INTRAMUSCULAR
  Filled 2023-07-10: qty 1

## 2023-07-10 MED ORDER — CYCLOBENZAPRINE HCL 10 MG PO TABS: 10.0000 mg | ORAL_TABLET | Freq: Two times a day (BID) | ORAL | 0 refills | Status: DC | PRN

## 2023-07-10 MED ORDER — LIDOCAINE 5 % EX PTCH
1.0000 | MEDICATED_PATCH | CUTANEOUS | Status: DC
  Administered 2023-07-10: 1 via TRANSDERMAL
  Filled 2023-07-10: qty 1
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
## 2023-07-11 ENCOUNTER — Ambulatory Visit (INDEPENDENT_AMBULATORY_CARE_PROVIDER_SITE_OTHER): Payer: Medicaid Other | Admitting: Student

## 2023-07-11 ENCOUNTER — Encounter: Payer: Self-pay | Admitting: Student

## 2023-07-11 ENCOUNTER — Other Ambulatory Visit (HOSPITAL_COMMUNITY)
Admission: RE | Admit: 2023-07-11 | Discharge: 2023-07-11 | Disposition: A | Discharge status: Home / Self Care | Payer: Medicaid Other | Source: Ambulatory Visit | Attending: Obstetrics and Gynecology | Admitting: Obstetrics and Gynecology

## 2023-07-11 VITALS — BP 114/76 | HR 74 | Ht 70.0 in | Wt 298.9 lb

## 2023-07-11 DIAGNOSIS — Z01419 Encounter for gynecological examination (general) (routine) without abnormal findings: Secondary | ICD-10-CM | POA: Insufficient documentation

## 2023-07-11 DIAGNOSIS — Z1339 Encounter for screening examination for other mental health and behavioral disorders: Secondary | ICD-10-CM | POA: Diagnosis not present

## 2023-07-11 NOTE — Progress Notes (Signed)
ANNUAL EXAM Patient name: Lydia Gross MRN [id number]  Date of birth: 09/13/1976 Chief Complaint:   New Patient (Initial Visit)  History of Present Illness:   Lydia Gross is a 47 y.o. [phone] African-American female being seen today for a routine annual exam.  Current complaints: No Gynecological concerns present at this time  Patient's last menstrual period was 07/01/2023 (approximate).   The pregnancy intention screening data noted above was reviewed. Potential methods of contraception were discussed. The patient elected to proceed with No data recorded.   Last pap 2023. Results were: NILM w/ HRHPV positive: type not specified. H/O abnormal pap: yes Last mammogram: 05/2023. Results were: normal per patient report Family h/o breast cancer: no Last colonoscopy: n/a. Results were: N/A. Family h/o colorectal cancer: no     07/11/2023   10:18 AM 07/09/2022    8:34 AM  Depression screen PHQ 2/9  Decreased Interest 0 2  Down, Depressed, Hopeless 0 1  PHQ - 2 Score 0 3  Altered sleeping 2 0  Tired, decreased energy 2 1  Change in appetite 0 0  Feeling bad or failure about yourself  0 0  Trouble concentrating 0 1  Moving slowly or fidgety/restless 0 0  Suicidal thoughts 0 0  PHQ-9 Score 4 5        07/11/2023   10:18 AM 07/09/2022    8:34 AM  GAD 7 : Generalized Anxiety Score  Nervous, Anxious, on Edge 2 1  Control/stop worrying 2 2  Worry too much - different things 2 2  Trouble relaxing 2 1  Restless 1 0  Easily annoyed or irritable 1 1  Afraid - awful might happen 0 0  Total GAD 7 Score 10 7     Review of Systems:   Pertinent items are noted in HPI Denies any headaches, blurred vision, fatigue, shortness of breath, chest pain, abdominal pain, abnormal vaginal discharge/itching/odor/irritation, problems with periods, bowel movements, urination, or intercourse unless otherwise stated above. Pertinent History Reviewed:  Reviewed past medical,surgical, social  and family history.  Reviewed problem list, medications and allergies. Physical Assessment:   Vitals:   07/11/23 0925  BP: 114/76  Pulse: 74  Weight: 298 lb 14.4 oz (135.6 kg)  Height: 5\' 10"  (1.778 m)  Body mass index is 42.89 kg/m.        Physical Examination:   General appearance - well appearing, and in no distress  Mental status - alert, oriented to person, place, and time  Psych:  She has a normal mood and affect  Skin - warm and dry, normal color, no suspicious lesions noted  Chest - effort normal, all lung fields clear to auscultation bilaterally  Heart - normal rate and regular rhythm  Neck:  midline trachea, no thyromegaly or nodules  Breasts - breasts appear normal, no suspicious masses, no skin or nipple changes   Abdomen - soft, nontender, nondistended, no masses or organomegaly  Pelvic - VULVA: normal appearing vulva with no masses, tenderness or lesions  VAGINA: normal appearing vagina with normal color and discharge, no lesions  CERVIX: normal appearing cervix without discharge or lesions, no CMT  Thin prep pap is done with HR HPV cotesting  UTERUS: uterus is felt to be normal size, shape, consistency and nontender   ADNEXA: No adnexal masses or tenderness noted.  Extremities:  No swelling or varicosities noted  Chaperone present for exam  No results found for this or any previous visit (from the past 24 hour(s)).  Assessment & Plan:  1. Women's annual routine gynecological examination - Cytology - PAP( [place]) - HIV antibody (with reflex) - RPR - Hepatitis B Surface AntiGEN - Hepatitis C Antibody - Cervicovaginal ancillary only( [place])  Labs/procedures today:   Mammogram: in 1 year, or sooner if problems Colonoscopy:  Scheduled for 08/2023 , or sooner if problems  Orders Placed This Encounter  Procedures   HIV antibody (with reflex)   RPR   Hepatitis B Surface AntiGEN   Hepatitis C Antibody    Meds: No orders of the defined types were  placed in this encounter.   Follow-up: Return in about 1 year (around 07/10/2024) for ANN.  Corlis Hove, NP 07/11/2023 10:31 AM

## 2023-07-11 NOTE — Progress Notes (Signed)
New GYN is in the office for annual Hx of BTL LMP 07/01/2023 Last pap 04/19/2022 Pt reports last mammogram was about a month ago through [facility] with normal results. Pt desires full STD screening

## 2023-07-12 LAB — CERVICOVAGINAL ANCILLARY ONLY
Bacterial Vaginitis (gardnerella): POSITIVE — AB
Candida Glabrata: NEGATIVE
Candida Vaginitis: NEGATIVE
Chlamydia: NEGATIVE
Comment: NEGATIVE
Comment: NEGATIVE
Comment: NEGATIVE
Comment: NEGATIVE
Comment: NEGATIVE
Comment: NORMAL
Neisseria Gonorrhea: NEGATIVE
Trichomonas: NEGATIVE

## 2023-07-12 LAB — HEPATITIS B SURFACE ANTIGEN: Hepatitis B Surface Ag: NEGATIVE

## 2023-07-12 LAB — RPR: RPR Ser Ql: NONREACTIVE

## 2023-07-12 LAB — HIV ANTIBODY (ROUTINE TESTING W REFLEX): HIV Screen 4th Generation wRfx: NONREACTIVE

## 2023-07-12 LAB — HEPATITIS C ANTIBODY: Hep C Virus Ab: NONREACTIVE

## 2023-07-14 ENCOUNTER — Other Ambulatory Visit: Payer: Self-pay | Admitting: Student

## 2023-07-14 DIAGNOSIS — B3731 Acute candidiasis of vulva and vagina: Secondary | ICD-10-CM

## 2023-07-14 MED ORDER — FLUCONAZOLE 150 MG PO TABS
150.0000 mg | ORAL_TABLET | Freq: Once | ORAL | 1 refills | Status: AC
Start: 2023-07-14 — End: 2023-07-14

## 2023-07-19 ENCOUNTER — Other Ambulatory Visit: Payer: Self-pay

## 2023-07-19 DIAGNOSIS — B9689 Other specified bacterial agents as the cause of diseases classified elsewhere: Secondary | ICD-10-CM

## 2023-07-19 MED ORDER — METRONIDAZOLE 0.75 % VA GEL
1.0000 | Freq: Every day | VAGINAL | 1 refills | Status: DC
Start: 2023-07-19 — End: 2023-12-02

## 2023-07-22 LAB — CYTOLOGY - PAP
Adequacy: ABSENT
Comment: NEGATIVE
Comment: NEGATIVE
Comment: NEGATIVE
Diagnosis: NEGATIVE
HPV 16: NEGATIVE
HPV 18 / 45: NEGATIVE
High risk HPV: POSITIVE — AB

## 2023-07-24 ENCOUNTER — Encounter: Payer: Medicaid Other | Admitting: Obstetrics and Gynecology

## 2023-08-02 ENCOUNTER — Telehealth: Payer: Self-pay | Admitting: Neurology

## 2023-08-02 NOTE — Telephone Encounter (Signed)
Left message with the after hour service on 08-02-23 12:46 pm   Caller states that she she had a MRI  with some issues  she states that it hurts to lay on her rt side of her head

## 2023-08-02 NOTE — Telephone Encounter (Signed)
Called and told pt we need records for [facility] to help Dr. Loleta Chance review. She also had questions that she will My Chart to Dr. Loleta Chance.

## 2023-08-09 ENCOUNTER — Inpatient Hospital Stay: Payer: Medicaid Other | Admitting: Pulmonary Disease
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
## 2023-08-29 ENCOUNTER — Telehealth: Payer: Medicaid Other | Admitting: Neurology

## 2023-08-29 ENCOUNTER — Encounter: Payer: Self-pay | Admitting: Neurology

## 2023-08-29 VITALS — Ht 70.0 in | Wt 298.0 lb

## 2023-08-29 DIAGNOSIS — R519 Headache, unspecified: Secondary | ICD-10-CM

## 2023-08-29 DIAGNOSIS — G8929 Other chronic pain: Secondary | ICD-10-CM

## 2023-08-29 DIAGNOSIS — M489 Spondylopathy, unspecified: Secondary | ICD-10-CM | POA: Diagnosis not present

## 2023-08-29 DIAGNOSIS — M542 Cervicalgia: Secondary | ICD-10-CM

## 2023-08-29 DIAGNOSIS — K5900 Constipation, unspecified: Secondary | ICD-10-CM

## 2023-08-29 DIAGNOSIS — G932 Benign intracranial hypertension: Secondary | ICD-10-CM

## 2023-09-19 ENCOUNTER — Telehealth: Payer: Self-pay | Admitting: Licensed Clinical Social Worker

## 2023-09-19 NOTE — Telephone Encounter (Signed)
[facility] contacted patient on this date to provide a BH intro and to schedule [facility] appointment. Patient reports that she does have anxiety and no longer taking her medications. She reports at this time she's not interested in scheduling a BH appointment but will call to schedule if symptoms worsen.

## 2023-09-25 ENCOUNTER — Encounter (HOSPITAL_COMMUNITY): Payer: Self-pay

## 2023-09-25 ENCOUNTER — Other Ambulatory Visit: Payer: Self-pay

## 2023-09-25 ENCOUNTER — Emergency Department (HOSPITAL_COMMUNITY)
Admission: EM | Admit: 2023-09-25 | Discharge: 2023-09-25 | Discharge status: Against Medical Advice | Payer: Medicaid Other | Attending: Emergency Medicine | Admitting: Emergency Medicine

## 2023-09-25 ENCOUNTER — Encounter: Payer: Medicaid Other | Admitting: Obstetrics & Gynecology

## 2023-09-25 DIAGNOSIS — Z5321 Procedure and treatment not carried out due to patient leaving prior to being seen by health care provider: Secondary | ICD-10-CM | POA: Insufficient documentation

## 2023-09-25 DIAGNOSIS — R519 Headache, unspecified: Secondary | ICD-10-CM | POA: Diagnosis present

## 2023-09-25 NOTE — ED Notes (Signed)
Attempt to place pt in room from triage, pt not in waiting room

## 2023-09-25 NOTE — ED Triage Notes (Signed)
Headache that started this AM, no relief with 200mg  ibuprofen at home. No light/sound sensitivity.

## 2023-09-30 ENCOUNTER — Telehealth: Payer: Self-pay | Admitting: Neurology

## 2023-09-30 NOTE — Telephone Encounter (Signed)
Called patient and informed her that per Dr. Loleta Chance that  When I saw patient (video visit) last month she was not having headaches nor did she mention problems with her eye. Given this change, I probably need to see her. Can we let her know I'd like to see her in the office to assess what is going on? Then we can get her scheduled ASAP.  Patient also wanted to let Dr. Loleta Chance know that the back of her neck is hurting and she feels "knots" back there. I informed patient that I will pass this message to Dr. Loleta Chance and that this will be an issue to address in person as well. Patient is aware someone from the front will call her to ger her scheduled with Dr. Loleta Chance.
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
## 2023-09-30 NOTE — Telephone Encounter (Signed)
Pt called in stating her right eye stays dry. It's hard for her to open her eye at night. She still has headaches. Her endocrinologist has done all kinds of testing and tells her everything is ok. She says something is wrong.

## 2023-10-02 ENCOUNTER — Encounter: Payer: Self-pay | Admitting: Neurology

## 2023-10-02 ENCOUNTER — Other Ambulatory Visit: Payer: Medicaid Other

## 2023-10-02 ENCOUNTER — Ambulatory Visit (INDEPENDENT_AMBULATORY_CARE_PROVIDER_SITE_OTHER): Payer: Medicaid Other | Admitting: Neurology

## 2023-10-02 VITALS — BP 121/85 | HR 78 | Ht 71.0 in | Wt 312.0 lb

## 2023-10-02 DIAGNOSIS — M542 Cervicalgia: Secondary | ICD-10-CM | POA: Diagnosis not present

## 2023-10-02 DIAGNOSIS — R0683 Snoring: Secondary | ICD-10-CM

## 2023-10-02 DIAGNOSIS — M489 Spondylopathy, unspecified: Secondary | ICD-10-CM

## 2023-10-02 DIAGNOSIS — K5900 Constipation, unspecified: Secondary | ICD-10-CM

## 2023-10-02 DIAGNOSIS — G8929 Other chronic pain: Secondary | ICD-10-CM

## 2023-10-02 DIAGNOSIS — R519 Headache, unspecified: Secondary | ICD-10-CM

## 2023-10-02 DIAGNOSIS — G932 Benign intracranial hypertension: Secondary | ICD-10-CM

## 2023-10-02 NOTE — Patient Instructions (Signed)
I saw you today for headaches, neck pain, and dry right eye. I don't see clear evidence of increased pressure causing your headaches, but given your IIH, this is something we need to watch carefully.  I would like check a lab test today to screen for autoimmune type conditions. I will be in touch when I have the results.  I am referring you to physical therapy for neck pain. They may try dry needling to help with your tight neck.  I am referring you to sleep medicine as your morning headaches could be due to sleep apnea. Improving your sleep will likely improve your headaches no matter the cause.  We discussed headache medication, but you wanted to hold off for now. You can call if you change your mind.  I will see you back in clinic in 6 months or sooner if needed.  The physicians and staff at [facility] are committed to providing excellent care. You may receive a survey requesting feedback about your experience at our office. We strive to receive "very good" responses to the survey questions. If you feel that your experience would prevent you from giving the office a "very good " response, please contact our office to try to remedy the situation. We may be reached at [phone]. Thank you for taking the time out of your busy day to complete the survey.  Jacquelyne Balint, MD [facility]

## 2023-10-03 LAB — ANA: Anti Nuclear Antibody (ANA): NEGATIVE

## 2023-10-10 ENCOUNTER — Encounter: Payer: Medicaid Other | Admitting: Obstetrics and Gynecology

## 2023-10-11 ENCOUNTER — Ambulatory Visit (INDEPENDENT_AMBULATORY_CARE_PROVIDER_SITE_OTHER): Payer: Medicaid Other | Admitting: Pulmonary Disease

## 2023-10-11 ENCOUNTER — Encounter: Payer: Self-pay | Admitting: Pulmonary Disease

## 2023-10-11 VITALS — BP 119/82 | HR 84 | Ht 70.0 in | Wt 313.2 lb

## 2023-10-11 DIAGNOSIS — E66813 Obesity, class 3: Secondary | ICD-10-CM | POA: Diagnosis not present

## 2023-10-11 DIAGNOSIS — G478 Other sleep disorders: Secondary | ICD-10-CM | POA: Diagnosis not present

## 2023-10-11 DIAGNOSIS — R0683 Snoring: Secondary | ICD-10-CM

## 2023-10-11 NOTE — Progress Notes (Unsigned)
Lydia Gross    [id number]    16-Jun-1976  Primary Care Physician:Beal, Christa See  Referring Physician: Ladora Daniel, PA-C [address] [place],  [place] [address]  Chief complaint:   Patient with a history of snoring, nonrestorative sleep  HPI:  Concern for sleep apnea with gasping and choking at night Significant snoring nonrestorative and interrupted sleep Usually goes to bed between 8 and 11 PM Takes about 15 minutes to fall asleep  Wakes up many times during the night very significantly Final wake up time about 6 AM  Weight is down about 40 pounds from having swallowing issues, has followed up with GI and other providers  She does have a history of hypertension, hypercholesterolemia No family history of obstructive sleep apnea known to her Admits to dryness of her mouth in the mornings Sometimes has a headache in the morning, rare night sweats  Smoked in the past quit about a month ago was only smoking about 1 to 2 cigarettes a day  No pertinent occupational history  She does get sleepy during the day  Outpatient Encounter Medications as of 10/11/2023  Medication Sig  . cyclobenzaprine (FLEXERIL) 10 MG tablet Take 1 tablet (10 mg total) by mouth 2 (two) times daily as needed for muscle spasms. (Patient not taking: Reported on 10/02/2023)  . hydrochlorothiazide (HYDRODIURIL) 25 MG tablet Take 25 mg by mouth every morning.   Marland Kitchen LINZESS 145 MCG CAPS capsule Take 145 mcg by mouth daily.  . meloxicam (MOBIC) 15 MG tablet Take by mouth as needed.  . metroNIDAZOLE (METROGEL) 0.75 % vaginal gel Place 1 Applicatorful vaginally at bedtime. Apply one applicatorful to vagina at bedtime for 5 days (Patient not taking: Reported on 10/02/2023)  . olmesartan (BENICAR) 5 MG tablet Take 20 mg by mouth daily.  Marland Kitchen omeprazole (PRILOSEC) 20 MG capsule Take 20 mg by mouth daily.  . pantoprazole (PROTONIX) 40 MG tablet Take by mouth. (Patient not taking: Reported on  10/02/2023)   No facility-administered encounter medications on file as of 10/11/2023.    Allergies as of 10/11/2023 - Review Complete 10/02/2023  Allergen Reaction Noted  . Amoxicillin Hives 06/26/2023    Past Medical History:  Diagnosis Date  . Acid reflux   . Anxiety   . Chronic neck pain   . Gout   . Hypertension   . Obesity     Past Surgical History:  Procedure Laterality Date  . CESAREAN SECTION    . CHOLECYSTECTOMY    . DENTAL RESTORATION/EXTRACTION WITH X-RAY  03/05/2023   L lower molar extraction  . TUBAL LIGATION      Family History  Problem Relation Age of Onset  . Hypertension Mother   . Other Daughter        IIH  . Thyroid disease Neg Hx     Social History   Socioeconomic History  . Marital status: Single    Spouse name: Not on file  . Number of children: Not on file  . Years of education: Not on file  . Highest education level: Not on file  Occupational History  . Not on file  Tobacco Use  . Smoking status: Former    Types: Cigarettes    Passive exposure: Never  . Smokeless tobacco: Not on file  . Tobacco comments:    Stopped over a month ago   Vaping Use  . Vaping status: Never Used  Substance and Sexual Activity  . Alcohol use:  Not Currently    Comment: little  . Drug use: No  . Sexual activity: Not Currently    Birth control/protection: Surgical  Other Topics Concern  . Not on file  Social History Narrative   Are you right handed or left handed? Left handed    Are you currently employed ? No    What is your current occupation?   Do you live at home alone? No    Who lives with you? With Daughter    What type of home do you live in: 1 story or 2 story? Lives in a two story home.    Caffeine rarely    Social Drivers of Corporate investment banker Strain: Low Risk  (12/31/2022)   Received from [facility], [facility]   Overall Financial Resource Strain (CARDIA)   . Difficulty of Paying Living Expenses: Not hard at all   Food Insecurity: No Food Insecurity (12/31/2022)   Received from [facility], [facility]   Hunger Vital Sign   . Worried About Programme researcher, broadcasting/film/video in the Last Year: Never true   . Ran Out of Food in the Last Year: Never true  Transportation Needs: No Transportation Needs (12/31/2022)   Received from [facility], [facility]   [facility] - Transportation   . Lack of Transportation (Medical): No   . Lack of Transportation (Non-Medical): No  Physical Activity: Not on file  Stress: No Stress Concern Present (08/19/2023)   Received from [facility] - Occupational Stress Questionnaire   . Feeling of Stress : Not at all  Social Connections: Unknown (02/11/2022)   Received from [facility], [facility]   Social Network   . Social Network: Not on file  Intimate Partner Violence: Not At Risk (08/19/2023)   Received from [facility]   HITS   . Over the last 12 months how often did your partner physically hurt you?: Never   . Over the last 12 months how often did your partner insult you or talk down to you?: Never   . Over the last 12 months how often did your partner threaten you with physical harm?: Never   . Over the last 12 months how often did your partner scream or curse at you?: Never    Review of Systems  Constitutional:  Positive for fatigue.  Psychiatric/Behavioral:  Positive for sleep disturbance.     There were no vitals filed for this visit.    Physical Exam Constitutional:      Appearance: She is obese.  HENT:     Head: Normocephalic.     Nose: Nose normal.     Mouth/Throat:     Comments: Mallampati 3, crowded oropharynx Eyes:     General: No scleral icterus.    Pupils: Pupils are equal, round, and reactive to light.  Cardiovascular:     Rate and Rhythm: Normal rate and regular rhythm.     Heart sounds: No murmur heard.    No friction rub.  Pulmonary:     Effort: No respiratory distress.     Breath sounds:  No stridor. No wheezing or rhonchi.  Musculoskeletal:     Cervical back: No rigidity or tenderness.  Neurological:     Mental Status: She is alert.  Psychiatric:        Mood and Affect: Mood normal.      02/13/2023    9:00 AM  Results of the Epworth flowsheet  Sitting  and reading 3  Watching TV 3  Sitting, inactive in a public place (e.g. a theatre or a meeting) 0  As a passenger in a car for an hour without a break 1  Lying down to rest in the afternoon when circumstances permit 3  Sitting and talking to someone 0  Sitting quietly after a lunch without alcohol 1  In a car, while stopped for a few minutes in traffic 0  Total score 11   Data Reviewed: No previous sleep study on record  Assessment:  Excessive daytime sleepiness -Likely related to untreated sleep disordered breathing  Moderate to high probability of significant obstructive sleep apnea -Patient will benefit from further evaluation with a sleep study  Class III obesity -Importance of weight management discussed with the patient  Hypertension -On medications  Cardiac dysrhythmia  History of depression  Pathophysiology of sleep disordered breathing discussed with the patient Treatment options for sleep disordered breathing discussed with patient  Plan/Recommendations: Will schedule patient for a home sleep test  Weight loss efforts encouraged  Tentative follow-up scheduled for about 3 months  Encouraged to call with any significant concerns   Virl Diamond MD [facility] 10/11/2023, 11:18 AM  CC: Ladora Daniel, PA-C

## 2023-10-11 NOTE — Patient Instructions (Addendum)
I will see you back in about 6 months  Make sure you follow-up with a sleep study  Graded activity as tolerated  The ultrasound of your heart that you had done at [facility] does not show that you have pulmonary hypertension  Call us with significant concerns

## 2023-10-14 NOTE — Therapy (Deleted)
 OUTPATIENT PHYSICAL THERAPY LYMPHEDEMA EVALUATION  Patient Name: Lydia Gross MRN: [id number] DOB:1976-08-27, 47 y.o., female Today's Date: 10/14/2023  END OF SESSION:   Past Medical History:  Diagnosis Date   Acid reflux    Anxiety    Chronic neck pain    Gout    Hypertension    Obesity    Past Surgical History:  Procedure Laterality Date   CESAREAN SECTION     CHOLECYSTECTOMY     DENTAL RESTORATION/EXTRACTION WITH X-RAY  03/05/2023   L lower molar extraction   TUBAL LIGATION     Patient Active Problem List   Diagnosis Date Noted   Excessive daytime sleepiness 02/13/2023   Non-restorative sleep 02/13/2023   Snoring 04/04/2022   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   Thyroid  nodule 01/26/2014   Cervical pain 01/12/2014   Lower leg edema 05/15/2011   Avitaminosis D 05/02/2011   Alopecia 05/01/2011   Low back pain 12/01/2010   Extreme obesity 12/01/2010   Malaise and fatigue 12/01/2010   Essential (primary) hypertension 12/01/2010   Insomnia 09/20/2010   Iron deficiency anemia secondary to inadequate dietary iron intake 09/20/2010   Gastroesophageal reflux disease with esophagitis 07/21/2010   Anemia 06/21/2010   Adjustment disorder with depressed mood 06/15/2010   Allergic rhinitis 04/16/2008    PCP: ***  REFERRING PROVIDER: ***  REFERRING DIAG: ***  THERAPY DIAG:  No diagnosis found.  Rationale for Evaluation and Treatment: {HABREHAB:27488}  ONSET DATE: ***  SUBJECTIVE:                                                                                                                                                                                           SUBJECTIVE STATEMENT:  PERTINENT HISTORY:   PAIN:  Are you having pain? {yes/no:20286} NPRS scale: ***/10 Pain location: *** Pain orientation: {Pain Orientation:25161}  PAIN TYPE: {type:313116} Pain description: {PAIN DESCRIPTION:21022940}  Aggravating factors: *** Relieving factors:  ***  PRECAUTIONS: {Therapy precautions:24002}  RED FLAGS: {PT Red Flags:29287}   WEIGHT BEARING RESTRICTIONS: {Yes ***/No:24003}  FALLS:  Has patient fallen in last 6 months? {fallsyesno:27318}  LIVING ENVIRONMENT: Lives with: {OPRC lives with:25569::lives with their family} Lives in: {Lives in:25570} Stairs: {opstairs:27293} Has following equipment at home: {Assistive devices:23999}  OCCUPATION: ***  LEISURE: ***  HAND DOMINANCE: {RIGHT/LEFT:21944}   PRIOR LEVEL OF FUNCTION: {PLOF:24004}  PATIENT GOALS: ***   OBJECTIVE: Note: Objective measures were completed at Evaluation unless otherwise noted.  COGNITION: Overall cognitive status: {cognition:24006}   PALPATION: ***  OBSERVATIONS / OTHER ASSESSMENTS: ***  SENSATION: Light touch: {intact/deficits:24005} Stereognosis: {intact/deficits:24005} Hot/Cold: {intact/deficits:24005} Proprioception: {intact/deficits:24005}  POSTURE: ***  UPPER EXTREMITY AROM/PROM:  A/PROM RIGHT   eval   Shoulder extension   Shoulder flexion   Shoulder abduction   Shoulder internal rotation   Shoulder external rotation     (Blank rows = not tested)  A/PROM LEFT   eval  Shoulder extension   Shoulder flexion   Shoulder abduction   Shoulder internal rotation   Shoulder external rotation     (Blank rows = not tested)  CERVICAL AROM: All within normal limits:    Percent limited  Flexion   Extension   Right lateral flexion   Left lateral flexion   Right rotation   Left rotation    UPPER EXTREMITY STRENGTH: ***  LOWER EXTREMITY AROM/PROM:  A/PROM Right eval  Hip flexion   Hip extension   Hip abduction   Hip adduction   Hip internal rotation   Hip external rotation   Knee flexion   Knee extension   Ankle dorsiflexion   Ankle plantarflexion   Ankle inversion   Ankle eversion    (Blank rows = not tested)  A/PROM LEFT eval  Hip flexion   Hip extension   Hip abduction   Hip adduction   Hip internal  rotation   Hip external rotation   Knee flexion   Knee extension   Ankle dorsiflexion   Ankle plantarflexion   Ankle inversion   Ankle eversion    (Blank rows = not tested)  LOWER EXTREMITY MMT: ***  LYMPHEDEMA ASSESSMENTS:   SURGERY TYPE/DATE: ***  NUMBER OF LYMPH NODES REMOVED: ***  CHEMOTHERAPY: ***  RADIATION:***  HORMONE TREATMENT: ***  INFECTIONS: ***  LYMPHEDEMA ASSESSMENTS:   LANDMARK RIGHT   eval  10 cm proximal to olecranon process   Olecranon process   10 cm proximal to ulnar styloid process   Just proximal to ulnar styloid process   Across hand at thumb web space   At base of 2nd digit   (Blank rows = not tested)  LANDMARK LEFT   eval  10 cm proximal to olecranon process   Olecranon process   10 cm proximal to ulnar styloid process   Just proximal to ulnar styloid process   Across hand at thumb web space   At base of 2nd digit   (Blank rows = not tested)   LE LANDMARK RIGHT eval  At groin   30 cm proximal to suprapatella   20 cm proximal to suprapatella   10 cm proximal to suprapatella   At midpatella / popliteal crease   30 cm proximal to floor at lateral plantar foot   20 cm proximal to floor at lateral plantar foot   10 cm proximal to floor at lateral plantar foot   Circumference of ankle/heel   5 cm proximal to 1st MTP joint   Across MTP joint   Around proximal great toe   (Blank rows = not tested)  LE LANDMARK LEFT eval  At groin   30 cm proximal to suprapatella   20 cm proximal to suprapatella   10 cm proximal to suprapatella   At midpatella / popliteal crease   30 cm proximal to floor at lateral plantar foot   20 cm proximal to floor at lateral plantar foot   10 cm proximal to floor at lateral plantar foot   Circumference of ankle/heel   5 cm proximal to 1st MTP joint   Across MTP joint   Around proximal great toe   (Blank rows = not tested)  FUNCTIONAL TESTS:  {Functional tests:24029}  GAIT:  Distance walked:  *** Assistive device utilized: {Assistive devices:23999} Level of assistance: {Levels of assistance:24026} Comments: ***  L-DEX LYMPHEDEMA SCREENING:  The patient was assessed using the L-Dex machine today to produce a lymphedema index baseline score. The patient will be reassessed on a regular basis (typically every 3 months) to obtain new L-Dex scores. If the score is > 6.5 points away from his/her baseline score indicating onset of subclinical lymphedema, it will be recommended to wear a compression garment for 4 weeks, 12 hours per day and then be reassessed. If the score continues to be > 6.5 points from baseline at reassessment, we will initiate lymphedema treatment. Assessing in this manner has a 95% rate of preventing clinically significant lymphedema.     QUICK DASH SURVEY: ***   TODAY'S TREATMENT:                                                                                                                              DATE: ***   PATIENT EDUCATION:  Education details: *** Person educated: {Person educated:25204} Education method: {Education Method:25205} Education comprehension: {Education Comprehension:25206}  HOME EXERCISE PROGRAM: ***  ASSESSMENT:  CLINICAL IMPRESSION: Patient is a *** y.o. *** who was seen today for physical therapy evaluation and treatment for ***.   OBJECTIVE IMPAIRMENTS: {opptimpairments:25111}.   ACTIVITY LIMITATIONS: {activitylimitations:27494}  PARTICIPATION LIMITATIONS: {participationrestrictions:25113}  PERSONAL FACTORS: {Personal factors:25162} are also affecting patient's functional outcome.   REHAB POTENTIAL: {rehabpotential:25112}  CLINICAL DECISION MAKING: {clinical decision making:25114}  EVALUATION COMPLEXITY: {Evaluation complexity:25115}  GOALS: Goals reviewed with patient? {yes/no:20286}  SHORT TERM GOALS: Target date: ***  *** Baseline: Goal status: INITIAL  2.  *** Baseline:  Goal status: INITIAL  3.   *** Baseline:  Goal status: INITIAL  4.  *** Baseline:  Goal status: INITIAL  5.  *** Baseline:  Goal status: INITIAL  6.  *** Baseline:  Goal status: INITIAL  LONG TERM GOALS: Target date: ***  *** Baseline:  Goal status: INITIAL  2.  *** Baseline:  Goal status: INITIAL  3.  *** Baseline:  Goal status: INITIAL  4.  *** Baseline:  Goal status: INITIAL  5.  *** Baseline:  Goal status: INITIAL  6.  *** Baseline:  Goal status: INITIAL  PLAN:  PT FREQUENCY: {rehab frequency:25116}  PT DURATION: {rehab duration:25117}  PLANNED INTERVENTIONS: {rehab planned interventions:25118::97110-Therapeutic exercises,97530- Therapeutic [id number]- Neuromuscular re-education,97535- Self Rjmz,02859- Manual therapy}  PLAN FOR NEXT SESSION: ***   Cena Bruhn,CINDY, PT 10/14/2023, 2:53 PM

## 2023-10-18 ENCOUNTER — Ambulatory Visit (HOSPITAL_COMMUNITY): Payer: Medicaid Other | Admitting: Physical Therapy

## 2023-10-21 ENCOUNTER — Encounter (HOSPITAL_COMMUNITY): Payer: Medicaid Other | Admitting: Physical Therapy

## 2023-10-23 ENCOUNTER — Encounter (HOSPITAL_COMMUNITY): Payer: Medicaid Other

## 2023-10-25 ENCOUNTER — Ambulatory Visit (HOSPITAL_COMMUNITY): Payer: No Typology Code available for payment source | Admitting: Physical Therapy

## 2023-10-28 ENCOUNTER — Encounter (HOSPITAL_COMMUNITY): Payer: Medicaid Other

## 2023-10-29 ENCOUNTER — Ambulatory Visit: Payer: No Typology Code available for payment source | Attending: Neurology

## 2023-10-30 ENCOUNTER — Encounter (HOSPITAL_COMMUNITY): Payer: Medicaid Other

## 2023-11-01 ENCOUNTER — Encounter (HOSPITAL_COMMUNITY): Payer: Medicaid Other

## 2023-11-04 ENCOUNTER — Encounter (HOSPITAL_COMMUNITY): Payer: Medicaid Other

## 2023-11-06 ENCOUNTER — Encounter (HOSPITAL_COMMUNITY): Payer: Medicaid Other | Admitting: Physical Therapy

## 2023-11-06 ENCOUNTER — Encounter: Payer: Medicaid Other | Admitting: Obstetrics & Gynecology

## 2023-11-08 ENCOUNTER — Encounter (HOSPITAL_COMMUNITY): Payer: Medicaid Other | Admitting: Physical Therapy

## 2023-11-11 ENCOUNTER — Encounter (HOSPITAL_COMMUNITY): Payer: Medicaid Other

## 2023-11-13 ENCOUNTER — Encounter (HOSPITAL_COMMUNITY): Payer: Medicaid Other | Admitting: Physical Therapy

## 2023-11-15 ENCOUNTER — Encounter (HOSPITAL_COMMUNITY): Payer: Medicaid Other

## 2023-11-18 ENCOUNTER — Encounter (HOSPITAL_COMMUNITY): Payer: Medicaid Other | Admitting: Physical Therapy

## 2023-11-20 ENCOUNTER — Encounter (HOSPITAL_COMMUNITY): Payer: Medicaid Other | Admitting: Physical Therapy

## 2023-11-22 ENCOUNTER — Other Ambulatory Visit: Payer: Self-pay | Admitting: Nephrology

## 2023-11-22 ENCOUNTER — Encounter (HOSPITAL_COMMUNITY): Payer: Medicaid Other | Admitting: Physical Therapy

## 2023-11-22 DIAGNOSIS — R109 Unspecified abdominal pain: Secondary | ICD-10-CM

## 2023-12-02 ENCOUNTER — Ambulatory Visit: Payer: Medicaid Other | Admitting: Obstetrics and Gynecology

## 2023-12-02 ENCOUNTER — Ambulatory Visit
Admission: RE | Admit: 2023-12-02 | Discharge: 2023-12-02 | Disposition: A | Discharge status: Home / Self Care | Payer: Medicaid Other | Source: Ambulatory Visit | Attending: Nephrology | Admitting: Nephrology

## 2023-12-02 ENCOUNTER — Encounter: Payer: Self-pay | Admitting: Obstetrics and Gynecology

## 2023-12-02 VITALS — BP 123/74 | HR 76 | Ht 69.0 in | Wt 308.0 lb

## 2023-12-02 DIAGNOSIS — R109 Unspecified abdominal pain: Secondary | ICD-10-CM

## 2023-12-02 DIAGNOSIS — Z6841 Body Mass Index (BMI) 40.0 and over, adult: Secondary | ICD-10-CM | POA: Diagnosis not present

## 2023-12-02 DIAGNOSIS — N920 Excessive and frequent menstruation with regular cycle: Secondary | ICD-10-CM

## 2023-12-02 MED ORDER — IBUPROFEN 600 MG PO TABS
600.0000 mg | ORAL_TABLET | Freq: Four times a day (QID) | ORAL | 2 refills | Status: AC | PRN
Start: 2023-12-02 — End: ?

## 2023-12-02 MED ORDER — TRANEXAMIC ACID 650 MG PO TABS
1300.0000 mg | ORAL_TABLET | Freq: Three times a day (TID) | ORAL | 2 refills | Status: DC
Start: 2023-12-02 — End: 2024-04-02

## 2023-12-02 NOTE — Progress Notes (Signed)
 48 y.o. GYN presents for heavy and painful periods. LMP 10/22/23.  Last PAP 07/11/23 NILM, +HR HPV Last Mammogram 05/13/23.

## 2023-12-02 NOTE — Progress Notes (Signed)
  CC: heavy menstrual bleeding Subjective:    Patient ID: Lydia Gross, female    DOB: 10/21/1975, 48 y.o.   MRN: [id number]  HPI 48 yo G4P3, c/s x 1 , SVD x 2 seen for discussion of heavy menses.  Pt has had heavy regular menses lasting 5-7 days.  LMP 10/22/23.  No menses in February.  Pt had a tubal after last c section.  Pt denies any history of dyspareunia.  Past Surgical History:  Procedure Laterality Date   CESAREAN SECTION     CHOLECYSTECTOMY     DENTAL RESTORATION/EXTRACTION WITH X-RAY  03/05/2023   L lower molar extraction   TUBAL LIGATION      Past Medical History:  Diagnosis Date   Acid reflux    Anxiety    Chronic neck pain    Gout    Hypertension    Obesity       Review of Systems     Objective:   Physical Exam Constitutional:      Appearance: She is obese.  HENT:     Head: Normocephalic and atraumatic.  Abdominal:     General: Abdomen is flat. There is no distension.     Palpations: Abdomen is soft. There is no mass.     Tenderness: There is no abdominal tenderness.     Comments: Obese with large pannus  Genitourinary:    Comments: SSE: normal cervix and vagina SVE: apparent normal sized uterus Neurological:     Mental Status: She is alert.    Vitals:   12/02/23 1600 12/02/23 1608  BP: (!) 151/82 123/74  Pulse: 77 76         Assessment & Plan:   1. Menorrhagia with regular cycle (Primary) Will check pelvic ultrasound to check for structural defects ie fibroids. Trial of lysteda and ibuprofen to help control heavy menses. Will follow up scan in 1 month at time of colposcopy - US PELVIC COMPLETE WITH TRANSVAGINAL; Future - tranexamic acid (LYSTEDA) 650 MG TABS tablet; Take 2 tablets (1,300 mg total) by mouth 3 (three) times daily. Take during menses for a maximum of five days  Dispense: 30 tablet; Refill: 2 - ibuprofen (ADVIL) 600 MG tablet; Take 1 tablet (600 mg total) by mouth every 6 (six) hours as needed for moderate pain (pain score  4-6) or cramping.  Dispense: 30 tablet; Refill: 2    Warden Fillers, MD Faculty Attending, [facility]

## 2023-12-02 NOTE — Patient Instructions (Signed)
 Do not take ibuprofen and mobic together

## 2023-12-09 ENCOUNTER — Ambulatory Visit (HOSPITAL_COMMUNITY): Payer: Medicaid Other

## 2023-12-09 ENCOUNTER — Ambulatory Visit (HOSPITAL_COMMUNITY)
Admission: RE | Admit: 2023-12-09 | Discharge: 2023-12-09 | Disposition: A | Discharge status: Home / Self Care | Payer: Medicaid Other | Source: Ambulatory Visit | Attending: Obstetrics and Gynecology | Admitting: Obstetrics and Gynecology

## 2023-12-09 DIAGNOSIS — N920 Excessive and frequent menstruation with regular cycle: Secondary | ICD-10-CM | POA: Diagnosis present
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
## 2024-01-01 ENCOUNTER — Ambulatory Visit: Payer: Self-pay | Admitting: Neurology

## 2024-01-07 ENCOUNTER — Encounter: Payer: Medicaid Other | Admitting: Obstetrics and Gynecology
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
## 2024-01-23 ENCOUNTER — Ambulatory Visit: Payer: Self-pay | Admitting: Pulmonary Disease

## 2024-01-23 NOTE — Telephone Encounter (Signed)
 We do not follow her for pulmonary problems. She is followed for sleep problems/snoring here. She will need to go to the ED for further evaluation.

## 2024-01-23 NOTE — Telephone Encounter (Signed)
 Please advise. Is a pt of Dr Wynona Neat who is off the rest of this week. Sending to Rhunette Croft, NP as dod.

## 2024-01-23 NOTE — Telephone Encounter (Signed)
 Copied from CRM [id number]. Topic: Appointments - Appointment Scheduling >> Jan 23, 2024  1:52 PM Brennan Bailey S wrote: Patient/patient representative is calling to schedule an appointment. Refer to attachments for appointment information.  Patient having trouble breathing, consistant for few days  TRIAGE SUMMARY NOTE: Pt reporting that she has been having moderate SOB for the past few days, worse with exertion or laying down, also having neck pain and chest soreness. Pt confirms she does not take any inhalers or treatments for her lungs. Pt requesting appt with pulm since she has had SOB in the past with no definitive diagnosis/treatment with ED visits. Last seen Dr. Wynona Neat in Dec 2024 for snoring. Advised pt go to ED asap and have another adult drive her. Pt refusing to go at this time, stating she may go later when able, stating she will call 911 if worsening. Advised pt go to ED right away, call 911 if any worsening, can follow up with pulm after hospital. Pt verbalized understanding.  E2C2 Pulmonary Triage - Initial Assessment Questions "Chief Complaint (e.g., cough, sob, wheezing, fever, chills, sweat or additional symptoms) *Go to specific symptom protocol after initial questions. Medium then like when lay down too Don't think wheezing Left side a little sore don't really hurt right there under my breastbone is like up under my neck Neck irritating me too Front and side of neck Denies chest pain and neck pain Trouble with throat last year and swollen lymph nodes, sore at lymph node not sure if it is the lymph node that hurts, left at top of chest a little sore Declines taking BP right now as requested by nurse No dizziness or sweating Not coughing anything up, coughing a little bit not too much No fever or runny nose Don't know if allergies bothering me so taking allergy med Throat hurts little bit too SOB even while sitting down, especially when walking and lay down it's worser Can't go right  now, can call ambulance if get worse  "How long have symptoms been present?" For few days  Have you tested for COVID or Flu? Note: If not, ask patient if a home test can be taken. If so, instruct patient to call back for positive results. No  MEDICINES:   "Have you used any OTC meds to help with symptoms?" Yes If yes, ask "What medications?" zyrtec  "Have you used your inhalers/maintenance medication?" No If yes, "What medications?" Don't have any  OXYGEN: "Do you wear supplemental oxygen?" No  "Do you monitor your oxygen levels?" No  Reason for Disposition  Difficulty breathing  Answer Assessment - Initial Assessment Questions 3. PATTERN "Does the difficult breathing come and go, or has it been constant since it started?"      Comes and goes 5. RECURRENT SYMPTOM: "Have you had difficulty breathing before?" If Yes, ask: "When was the last time?" and "What happened that time?"      Been a while, few months, was going to ED every other day last year and really didn't get anything resolved no answers nothing 6. CARDIAC HISTORY: "Do you have any history of heart disease?" (e.g., heart attack, angina, bypass surgery, angioplasty)      HTN 7. LUNG HISTORY: "Do you have any history of lung disease?"  (e.g., pulmonary embolus, asthma, emphysema)     Not that I know of but at one point last year something going on with my lungs, said something in feb or march of last year but they never said anything else  about it  Answer Assessment - Initial Assessment Questions 1. LOCATION: "Where does it hurt?"       Top left chest 2. RADIATION: "Does the pain go anywhere else?" (e.g., into neck, jaw, arms, back)     neck left side and front 8. PULMONARY RISK FACTORS: "Do you have any history of lung disease?"  (e.g., blood clots in lung, asthma, emphysema, birth control pills)     denies  Protocols used: Breathing Difficulty-A-AH, Chest Pain-A-AH

## 2024-01-24 ENCOUNTER — Ambulatory Visit: Admitting: Neurology

## 2024-01-24 ENCOUNTER — Emergency Department (HOSPITAL_COMMUNITY)

## 2024-01-24 ENCOUNTER — Other Ambulatory Visit: Payer: Self-pay

## 2024-01-24 ENCOUNTER — Emergency Department (HOSPITAL_COMMUNITY)
Admission: EM | Admit: 2024-01-24 | Discharge: 2024-01-24 | Disposition: A | Discharge status: Home / Self Care | Attending: Emergency Medicine | Admitting: Emergency Medicine

## 2024-01-24 ENCOUNTER — Encounter: Payer: Self-pay | Admitting: Neurology

## 2024-01-24 DIAGNOSIS — J069 Acute upper respiratory infection, unspecified: Secondary | ICD-10-CM | POA: Insufficient documentation

## 2024-01-24 DIAGNOSIS — Z720 Tobacco use: Secondary | ICD-10-CM | POA: Diagnosis not present

## 2024-01-24 DIAGNOSIS — R059 Cough, unspecified: Secondary | ICD-10-CM | POA: Diagnosis present

## 2024-01-24 DIAGNOSIS — Z79899 Other long term (current) drug therapy: Secondary | ICD-10-CM | POA: Insufficient documentation

## 2024-01-24 DIAGNOSIS — I10 Essential (primary) hypertension: Secondary | ICD-10-CM | POA: Diagnosis not present

## 2024-01-24 LAB — CBC WITH DIFFERENTIAL/PLATELET
Abs Immature Granulocytes: 0.01 10*3/uL (ref 0.00–0.07)
Basophils Absolute: 0 10*3/uL (ref 0.0–0.1)
Basophils Relative: 1 %
Eosinophils Absolute: 0.1 10*3/uL (ref 0.0–0.5)
Eosinophils Relative: 2 %
HCT: 42.3 % (ref 36.0–46.0)
Hemoglobin: 13.9 g/dL (ref 12.0–15.0)
Immature Granulocytes: 0 %
Lymphocytes Relative: 40 %
Lymphs Abs: 1.8 10*3/uL (ref 0.7–4.0)
MCH: 26.9 pg (ref 26.0–34.0)
MCHC: 32.9 g/dL (ref 30.0–36.0)
MCV: 82 fL (ref 80.0–100.0)
Monocytes Absolute: 0.3 10*3/uL (ref 0.1–1.0)
Monocytes Relative: 6 %
Neutro Abs: 2.4 10*3/uL (ref 1.7–7.7)
Neutrophils Relative %: 51 %
Platelets: 235 10*3/uL (ref 150–400)
RBC: 5.16 MIL/uL — ABNORMAL HIGH (ref 3.87–5.11)
RDW: 20.8 % — ABNORMAL HIGH (ref 11.5–15.5)
WBC: 4.6 10*3/uL (ref 4.0–10.5)
nRBC: 0 % (ref 0.0–0.2)

## 2024-01-24 LAB — COMPREHENSIVE METABOLIC PANEL WITH GFR
ALT: 12 U/L (ref 0–44)
AST: 12 U/L — ABNORMAL LOW (ref 15–41)
Albumin: 3.3 g/dL — ABNORMAL LOW (ref 3.5–5.0)
Alkaline Phosphatase: 33 U/L — ABNORMAL LOW (ref 38–126)
Anion gap: 5 (ref 5–15)
BUN: 11 mg/dL (ref 6–20)
CO2: 29 mmol/L (ref 22–32)
Calcium: 8.9 mg/dL (ref 8.9–10.3)
Chloride: 105 mmol/L (ref 98–111)
Creatinine, Ser: 0.91 mg/dL (ref 0.44–1.00)
GFR, Estimated: >60 mL/min (ref >60)
Glucose, Bld: 99 mg/dL (ref 70–99)
Potassium: 3.9 mmol/L (ref 3.5–5.1)
Sodium: 139 mmol/L (ref 135–145)
Total Bilirubin: 0.8 mg/dL (ref 0.0–1.2)
Total Protein: 6.4 g/dL — ABNORMAL LOW (ref 6.5–8.1)

## 2024-01-24 LAB — RESP PANEL BY RT-PCR (RSV, FLU A&B, COVID)  RVPGX2
Influenza A by PCR: NEGATIVE
Influenza B by PCR: NEGATIVE
Resp Syncytial Virus by PCR: NEGATIVE
SARS Coronavirus 2 by RT PCR: NEGATIVE

## 2024-01-24 LAB — HCG, SERUM, QUALITATIVE: Preg, Serum: NEGATIVE

## 2024-01-24 MED ORDER — ALBUTEROL SULFATE HFA 108 (90 BASE) MCG/ACT IN AERS
1.0000 | INHALATION_SPRAY | Freq: Once | RESPIRATORY_TRACT | Status: AC
  Administered 2024-01-24: 1 via RESPIRATORY_TRACT
  Filled 2024-01-24: qty 6.7

## 2024-01-24 MED ORDER — IPRATROPIUM-ALBUTEROL 0.5-2.5 (3) MG/3ML IN SOLN
3.0000 mL | Freq: Once | RESPIRATORY_TRACT | Status: AC
  Administered 2024-01-24: 3 mL via RESPIRATORY_TRACT
  Filled 2024-01-24: qty 3

## 2024-01-24 MED ORDER — PREDNISONE 20 MG PO TABS: 40.0000 mg | ORAL_TABLET | Freq: Every day | ORAL | 0 refills | Status: DC

## 2024-01-24 NOTE — ED Triage Notes (Signed)
 Patient reports SOB worse with exertion with occasional dry cough /chest congestion onset this week .

## 2024-01-24 NOTE — Telephone Encounter (Signed)
 I called and spoke to pt. Pt went to ED this morning and was given Inhaler and breathing treatment. Pt was informed to f/u with Pulmonary for this. Pt has never been seen for breathing issues, pt was seen for sleep/ snoring. Pt would need to establish as a new pt with a MD for this. Pt also does not wish to see Dr Wynona Neat any further, no stating reasons why. Dr Maple Hudson also see's sleep pts.   Dr Wynona Neat, okay to transfer pt to Dr Maple Hudson?  Dr Maple Hudson- Will you accept pt?  Front staff- please have pt scheduled.

## 2024-01-24 NOTE — Discharge Instructions (Addendum)
 Your lab work and chest x-ray are reassuring.  You can monitor your the results of respiratory panel on MyChart to make sure you do not have flu COVID or RSV.  I recommend staying well-hydrated with primarily water you can alternate Pedialyte or Gatorade.  If you have muscle aches or fever take 1000 mg of Tylenol every 6 hours.  You can also take ibuprofen.  I sent steroids to your pharmacy to take for 5 days.  Please carry around albuterol inhaler and use as needed for shortness of breath or wheezing.  Return to ER with new or worsening symptoms. Follow-up with primary care or pulmonologist in 5-7 days to make sure symptoms are improving.

## 2024-01-24 NOTE — ED Provider Notes (Signed)
 [place] EMERGENCY DEPARTMENT AT [facility] Provider Note   CSN: [id number] Arrival date & time: 01/24/24  7829     History  Chief Complaint  Patient presents with   Shortness of Breath    Lydia Gross is a 48 y.o. female.  With past medical history of obesity, hypertension, allergic rhinitis, GERD resenting to emergency room with complaint of cough.  Patient reports for the past 3 to 4 days she has had cough, congestion, runny nose and some shortness of breath. She dose not feel short of breath right now, reports this is only with extertion. She reports the cough is dry.  Has had some associated chills but has not taken temperature at home. Reports she dose have smoking history and tried to quiet but recently restarted smoking again. Denies chest pain.  Denies any swelling in feet and ankles, denies any recent travel or recent surgery denies hemoptysis. No history of DVT/PE.   Shortness of Breath      Home Medications Prior to Admission medications   Medication Sig Start Date End Date Taking? Authorizing Provider  hydrochlorothiazide (HYDRODIURIL) 25 MG tablet Take 25 mg by mouth every morning.     [provider]  ibuprofen (ADVIL) 600 MG tablet Take 1 tablet (600 mg total) by mouth every 6 (six) hours as needed for moderate pain (pain score 4-6) or cramping. 12/02/23   Abigail Abler, MD  LINZESS 145 MCG CAPS capsule Take 145 mcg by mouth daily.    [provider]  meloxicam (MOBIC) 15 MG tablet Take by mouth as needed. 06/05/23   [provider]  olmesartan (BENICAR) 5 MG tablet Take 20 mg by mouth daily. 02/26/23   [provider]  omeprazole (PRILOSEC) 20 MG capsule Take 20 mg by mouth daily.    [provider]  tranexamic acid (LYSTEDA) 650 MG TABS tablet Take 2 tablets (1,300 mg total) by mouth 3 (three) times daily. Take during menses for a maximum of five days 12/02/23   Abigail Abler, MD      Allergies     Amoxicillin    Review of Systems   Review of Systems  Respiratory:  Positive for shortness of breath.     Physical Exam Updated Vital Signs BP (!) 123/54 (BP Location: Right Arm)   Pulse 87   Temp 97.7 F (36.5 C)   Resp 16   SpO2 99%  Physical Exam Vitals and nursing note reviewed.  Constitutional:      General: She is not in acute distress.    Appearance: She is not toxic-appearing.  HENT:     Head: Normocephalic and atraumatic.  Eyes:     General: No scleral icterus.    Conjunctiva/sclera: Conjunctivae normal.  Cardiovascular:     Rate and Rhythm: Normal rate and regular rhythm.     Pulses: Normal pulses.     Heart sounds: Normal heart sounds.  Pulmonary:     Effort: Pulmonary effort is normal. No respiratory distress.     Breath sounds: Normal breath sounds.     Comments: No wheezing, no sign of distress.  Abdominal:     General: Abdomen is flat. Bowel sounds are normal.     Palpations: Abdomen is soft.     Tenderness: There is no abdominal tenderness.  Musculoskeletal:     Right lower leg: No edema.     Left lower leg: No edema.  Skin:    General: Skin is warm and dry.  Findings: No lesion.  Neurological:     General: No focal deficit present.     Mental Status: She is alert and oriented to person, place, and time. Mental status is at baseline.     ED Results / Procedures / Treatments   Labs (all labs ordered are listed, but only abnormal results are displayed) Labs Reviewed  CBC WITH DIFFERENTIAL/PLATELET - Abnormal; Notable for the following components:      Result Value   RBC 5.16 (*)    RDW 20.8 (*)    All other components within normal limits  COMPREHENSIVE METABOLIC PANEL WITH GFR - Abnormal; Notable for the following components:   Total Protein 6.4 (*)    Albumin 3.3 (*)    AST 12 (*)    Alkaline Phosphatase 33 (*)    All other components within normal limits  RESP PANEL BY RT-PCR (RSV, FLU A&B, COVID)  RVPGX2  HCG, SERUM, QUALITATIVE     EKG EKG Interpretation Date/Time:  Friday January 24 2024 06:17:28 EDT Ventricular Rate:  83 PR Interval:  162 QRS Duration:  82 QT Interval:  382 QTC Calculation: 448 R Axis:   1  Text Interpretation: Normal sinus rhythm Nonspecific T wave abnormality Abnormal ECG Confirmed by Shyrl Doyne [phone]) on 01/24/2024 7:45:33 AM  Radiology DG Chest 2 View Result Date: 01/24/2024 CLINICAL DATA:  Shortness of breath EXAM: CHEST - 2 VIEW COMPARISON:  06/26/2023 FINDINGS: Normal heart size and mediastinal contours. No acute infiltrate or edema. No effusion or pneumothorax. No acute osseous findings. IMPRESSION: No active cardiopulmonary disease. Electronically Signed   By: Ronnette Coke M.D.   On: 01/24/2024 06:36    Procedures Procedures    Medications Ordered in ED Medications - No data to display  ED Course/ Medical Decision Making/ A&P                                 Medical Decision Making Amount and/or Complexity of Data Reviewed Labs: ordered. Radiology: ordered.  Risk Prescription drug management.   This patient presents to the ED for concern of cough, this involves an extensive number of treatment options, and is a complaint that carries with it a high risk of complications and morbidity.  The differential diagnosis includes viral infection, bronchitis, pneumonia, pleural effusion, PE, ACS, allergies, BNP   Co morbidities that complicate the patient evaluation  Obesity Tobacco use    Lab Tests:  I personally interpreted labs.  The pertinent results include:   Resp panel pending.  Cbc without leukocytosis, no anemia.  CMP significant for normal kidney function, normal liver function and no electrolyte abnormality.    Imaging Studies ordered:  I ordered imaging studies including chest x-ray   I independently visualized and interpreted imaging which showed negative  I agree with the radiologist interpretation   Cardiac Monitoring: / EKG:  The  patient was maintained on a cardiac monitor.     Problem List / ED Course / Critical interventions / Medication management  Patient reports that she has had cough congestion chills and shortness of breath intermittently over the past 4 days. Symptoms are consistent with viral illness.  Patient reports that she feels sick.  She reports that she has a smoking history and has had to use inhalers in the past but does not currently have one.  She denies history of asthma or COPD.  On exam her lungs clear to auscultation bilaterally she is  in no distress not hypoxic. She is well appearing. Her chest x-ray shows no pneumonia or no pneumothorax.  She is PERC negative thus doubt DVT/PE. She has not fluid in lower extremity and no congestion on chest x-ray thus doubt CHF. Her EKG shows normal sinus. Labs are overall reassuring. Will viral screen with respiratory panel and try duo neb for associated SOB. Will send home with inhaler to use as needed for shortness of breath and wheezing and symptom managements.  Patient feeling better after breathing treatment, will send home with inhaler and steroids. Walked patient without any distress or increased RR. Vitals stable throughout stay. Symptomatic management.  Discussed follow-up and return precautions. I ordered medication including DuoNeb, Albuterol inhaler to take home. Reevaluation of the patient after these medicines showed that the patient improved I have reviewed the patients home medicines and have made adjustments as needed   Plan  F/u w/ PCP in 2-3d to ensure resolution of sx.  Patient was given return precautions. Patient stable for discharge at this time.  Patient educated on sx/dx and verbalized understanding of plan. Return to ER w/ new or worsening sx.          Final Clinical Impression(s) / ED Diagnoses Final diagnoses:  Viral URI with cough    Rx / DC Orders ED Discharge Orders     None         Eudora Heron,  PA-C 01/24/24 0854    Ninetta Basket, MD 01/29/24 [phone]

## 2024-01-25 ENCOUNTER — Encounter (HOSPITAL_COMMUNITY): Payer: Self-pay | Admitting: Emergency Medicine

## 2024-01-25 ENCOUNTER — Emergency Department (HOSPITAL_COMMUNITY)

## 2024-01-25 ENCOUNTER — Emergency Department (HOSPITAL_COMMUNITY)
Admission: EM | Admit: 2024-01-25 | Discharge: 2024-01-25 | Disposition: A | Discharge status: Home / Self Care | Attending: Emergency Medicine | Admitting: Emergency Medicine

## 2024-01-25 ENCOUNTER — Other Ambulatory Visit: Payer: Self-pay

## 2024-01-25 DIAGNOSIS — R1012 Left upper quadrant pain: Secondary | ICD-10-CM | POA: Diagnosis present

## 2024-01-25 DIAGNOSIS — E876 Hypokalemia: Secondary | ICD-10-CM | POA: Diagnosis not present

## 2024-01-25 LAB — COMPREHENSIVE METABOLIC PANEL WITH GFR
ALT: 12 U/L (ref 0–44)
AST: 13 U/L — ABNORMAL LOW (ref 15–41)
Albumin: 3.7 g/dL (ref 3.5–5.0)
Alkaline Phosphatase: 37 U/L — ABNORMAL LOW (ref 38–126)
Anion gap: 7 (ref 5–15)
BUN: 15 mg/dL (ref 6–20)
CO2: 24 mmol/L (ref 22–32)
Calcium: 8.8 mg/dL — ABNORMAL LOW (ref 8.9–10.3)
Chloride: 104 mmol/L (ref 98–111)
Creatinine, Ser: 0.83 mg/dL (ref 0.44–1.00)
GFR, Estimated: >60 mL/min (ref >60)
Glucose, Bld: 113 mg/dL — ABNORMAL HIGH (ref 70–99)
Potassium: 3.4 mmol/L — ABNORMAL LOW (ref 3.5–5.1)
Sodium: 135 mmol/L (ref 135–145)
Total Bilirubin: 1.1 mg/dL (ref 0.0–1.2)
Total Protein: 7.3 g/dL (ref 6.5–8.1)

## 2024-01-25 LAB — URINALYSIS, ROUTINE W REFLEX MICROSCOPIC
Bilirubin Urine: NEGATIVE
Glucose, UA: NEGATIVE mg/dL
Hgb urine dipstick: NEGATIVE
Ketones, ur: NEGATIVE mg/dL
Leukocytes,Ua: NEGATIVE
Nitrite: NEGATIVE
Protein, ur: NEGATIVE mg/dL
Specific Gravity, Urine: >1.046 — ABNORMAL HIGH (ref 1.005–1.030)
pH: 5 (ref 5.0–8.0)

## 2024-01-25 LAB — CBC
HCT: 44.6 % (ref 36.0–46.0)
Hemoglobin: 14.5 g/dL (ref 12.0–15.0)
MCH: 27 pg (ref 26.0–34.0)
MCHC: 32.5 g/dL (ref 30.0–36.0)
MCV: 83.1 fL (ref 80.0–100.0)
Platelets: 244 10*3/uL (ref 150–400)
RBC: 5.37 MIL/uL — ABNORMAL HIGH (ref 3.87–5.11)
RDW: 20.2 % — ABNORMAL HIGH (ref 11.5–15.5)
WBC: 7.1 10*3/uL (ref 4.0–10.5)
nRBC: 0 % (ref 0.0–0.2)

## 2024-01-25 LAB — HCG, SERUM, QUALITATIVE: Preg, Serum: NEGATIVE

## 2024-01-25 LAB — LIPASE, BLOOD: Lipase: 29 U/L (ref 11–51)

## 2024-01-25 MED ORDER — IOHEXOL 300 MG/ML  SOLN
100.0000 mL | Freq: Once | INTRAMUSCULAR | Status: AC | PRN
  Administered 2024-01-25: 100 mL via INTRAVENOUS

## 2024-01-25 NOTE — ED Provider Notes (Signed)
 [facility] EMERGENCY DEPARTMENT AT [facility] Provider Note   CSN: [id number] Arrival date & time: 01/25/24  1336     History Chief Complaint  Patient presents with   Abdominal Pain   Neck Pain   Near Syncope    Lydia Gross is a 48 y.o. female.  Patient presents to the emergency department today with concerns of abdominal pain.  Reports that she was in the emergency department 2 days prior for concerns of respiratory symptoms.  She states that she has improved some with the symptoms but now feeling that she is having left upper quad abdominal discomfort and distention.  Denies any nausea, vomiting, diarrhea.  No recent fever chills or bodyaches.   Abdominal Pain Neck Pain Near Syncope Associated symptoms include abdominal pain.       Home Medications Prior to Admission medications   Medication Sig Start Date End Date Taking? Authorizing Provider  hydrochlorothiazide (HYDRODIURIL) 25 MG tablet Take 25 mg by mouth every morning.     [provider]  ibuprofen (ADVIL) 600 MG tablet Take 1 tablet (600 mg total) by mouth every 6 (six) hours as needed for moderate pain (pain score 4-6) or cramping. 12/02/23   Abigail Abler, MD  LINZESS 145 MCG CAPS capsule Take 145 mcg by mouth daily.    [provider]  meloxicam (MOBIC) 15 MG tablet Take by mouth as needed. 06/05/23   [provider]  olmesartan (BENICAR) 5 MG tablet Take 20 mg by mouth daily. 02/26/23   [provider]  omeprazole (PRILOSEC) 20 MG capsule Take 20 mg by mouth daily.    [provider]  predniSONE (DELTASONE) 20 MG tablet Take 2 tablets (40 mg total) by mouth daily. 01/24/24   Barrett, Kandace Organ, PA-C  tranexamic acid (LYSTEDA) 650 MG TABS tablet Take 2 tablets (1,300 mg total) by mouth 3 (three) times daily. Take during menses for a maximum of five days 12/02/23   Abigail Abler, MD      Allergies    Amoxicillin    Review of Systems   Review of  Systems  Cardiovascular:  Positive for near-syncope.  Gastrointestinal:  Positive for abdominal pain.  Musculoskeletal:  Positive for neck pain.  All other systems reviewed and are negative.   Physical Exam Updated Vital Signs BP 135/64   Pulse 67   Temp 98 F (36.7 C) (Oral)   Resp 17   SpO2 100%  Physical Exam Vitals and nursing note reviewed.  Constitutional:      General: She is not in acute distress.    Appearance: She is well-developed.  HENT:     Head: Normocephalic and atraumatic.  Eyes:     Conjunctiva/sclera: Conjunctivae normal.  Cardiovascular:     Rate and Rhythm: Normal rate and regular rhythm.     Heart sounds: No murmur heard. Pulmonary:     Effort: Pulmonary effort is normal. No respiratory distress.     Breath sounds: Normal breath sounds.  Abdominal:     Palpations: Abdomen is soft.     Tenderness: There is abdominal tenderness in the left upper quadrant. There is no right CVA tenderness, left CVA tenderness, guarding or rebound.  Musculoskeletal:        General: No swelling.     Cervical back: Neck supple.  Skin:    General: Skin is warm and dry.     Capillary Refill: Capillary refill takes less than 2 seconds.  Neurological:  Mental Status: She is alert.  Psychiatric:        Mood and Affect: Mood normal.     ED Results / Procedures / Treatments   Labs (all labs ordered are listed, but only abnormal results are displayed) Labs Reviewed  CBC - Abnormal; Notable for the following components:      Result Value   RBC 5.37 (*)    RDW 20.2 (*)    All other components within normal limits  URINALYSIS, ROUTINE W REFLEX MICROSCOPIC - Abnormal; Notable for the following components:   APPearance HAZY (*)    Specific Gravity, Urine >1.046 (*)    All other components within normal limits  COMPREHENSIVE METABOLIC PANEL WITH GFR - Abnormal; Notable for the following components:   Potassium 3.4 (*)    Glucose, Bld 113 (*)    Calcium 8.8 (*)    AST  13 (*)    Alkaline Phosphatase 37 (*)    All other components within normal limits  HCG, SERUM, QUALITATIVE  LIPASE, BLOOD  CBG MONITORING, ED    EKG None  Radiology CT ABDOMEN PELVIS W CONTRAST Result Date: 01/25/2024 CLINICAL DATA:  LEFT upper quadrant pain. EXAM: CT ABDOMEN AND PELVIS WITH CONTRAST TECHNIQUE: Multidetector CT imaging of the abdomen and pelvis was performed using the standard protocol following bolus administration of intravenous contrast. RADIATION DOSE REDUCTION: This exam was performed according to the departmental dose-optimization program which includes automated exposure control, adjustment of the mA and/or kV according to patient size and/or use of iterative reconstruction technique. CONTRAST:  100mL OMNIPAQUE IOHEXOL 300 MG/ML  SOLN COMPARISON:  None Available. FINDINGS: Lower chest: Lung bases are clear. Hepatobiliary: No focal hepatic lesion. Postcholecystectomy. No biliary dilatation. Pancreas: Pancreas is normal. No ductal dilatation. No pancreatic inflammation. Spleen: Normal spleen Adrenals/urinary tract: Adrenal glands and kidneys are normal. The ureters and bladder normal. Stomach/Bowel: Stomach, small bowel, appendix, and cecum are normal. The colon and rectosigmoid colon are normal. Vascular/Lymphatic: Abdominal aorta is normal caliber. No periportal or retroperitoneal adenopathy. No pelvic adenopathy. Reproductive: Uterus and adnexa unremarkable. Other: No free fluid. Musculoskeletal: No aggressive osseous lesion. Disc space narrowing, endplate sclerosis and osteophytosis at L5-S1. IMPRESSION: 1. No acute findings in the abdomen pelvis. 2. Postcholecystectomy. 3. No explanation for LEFT upper quadrant pain. 4. 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 Electronically Signed   By: Deboraha Fallow M.D.   On: 01/25/2024 15:49   DG Chest 2 View Result Date: 01/24/2024 CLINICAL DATA:  Shortness of breath EXAM: CHEST - 2 VIEW COMPARISON:  06/26/2023 FINDINGS: Normal heart  size and mediastinal contours. No acute infiltrate or edema. No effusion or pneumothorax. No acute osseous findings. IMPRESSION: No active cardiopulmonary disease. Electronically Signed   By: Ronnette Coke M.D.   On: 01/24/2024 06:36    Procedures Procedures    Medications Ordered in ED Medications  iohexol (OMNIPAQUE) 300 MG/ML solution 100 mL (100 mLs Intravenous Contrast Given 01/25/24 1532)    ED Course/ Medical Decision Making/ A&P                                 Medical Decision Making Amount and/or Complexity of Data Reviewed Labs: ordered. Radiology: ordered.  Risk Prescription drug management.   This patient presents to the ED for concern of abdominal pain. Differential diagnosis includes splenomegaly, bowel obstruction, gastroenteritis, pyelonephritis   Lab Tests:  I Ordered, and personally interpreted labs.  The pertinent results include:  CBC unremarkable, CMP shows potassium at 3.4, lipase unremarkable, hCG negative, UA with some signs of dehydration with increased specific gravity   Imaging Studies ordered:  I ordered imaging studies including CT abdomen pelvis I independently visualized and interpreted imaging which showed 1. No acute findings in the abdomen pelvis. 2. Postcholecystectomy. 3. No explanation for LEFT upper quadrant pain. 4. 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 I agree with the radiologist interpretation   Problem List / ED Course:  Patient presents the emergency department today with concerns of abdominal pain.  Reports that she was in the emergency department 2 days ago for concerns of respiratory symptoms.  She states that she has improved with the symptoms but now endorsing pain in the left upper quadrant with discomfort and feelings of distention.  She denies any nausea, vomiting, diarrhea.  No recent fever, chills, body aches.  Denies any sick contacts as far she is aware.  She is requesting further assessment for this left-sided pain.  She  denies any urinary symptoms such as dysuria, increased frequency or urgency, or hematuria. Exam does reveal left upper quadrant tenderness.  No other focal area of pain.  No pain in the epigastrium.  Will obtain labs and imaging for further assessment of the symptoms.  Doubtful that this is likely significant finding is no abdominal distention is noted. Basic labs are thankfully reassuring.  Mild hypokalemia at 3.4 but otherwise unremarkable.  Urinalysis does show some concerns of possible mild dehydration with increased [place] gravity but there is no evidence of AKI on CMP.  Lipase unremarkable.  hCG negative. CT imaging negative for any acute findings.  Doubtful of bowel obstruction, appendicitis, pancreatitis, and pyelonephritis given reassuring CT imaging and labs.  I am unsure exactly what is causing patient's symptoms at this time.  Advise continued management and observation of the symptoms and return to the emergency department for any concerns of new or worsening symptoms.  Patient otherwise stable for outpatient follow-up and discharged home with plans for primary care follow-up.  Final Clinical Impression(s) / ED Diagnoses Final diagnoses:  LUQ pain    Rx / DC Orders ED Discharge Orders     None         Concetta Dee, PA-C 01/25/24 2000    Horton, Sidra Dredge, DO 01/26/24 1309

## 2024-01-25 NOTE — ED Triage Notes (Signed)
 Patient presents due to abdominal fullness and cramping on the left side. She believes there is swelling on the LUQ. Breathing has improved since yesterday, but she does feel as though she is about to have syncopal episodes sometimes. She complains of polyuria last night and a swollen painful lymph node on the left side of her neck. Patient was prescribed prednisone yesterday, but has not taken it nor has she taken her BP medication since yesterday.

## 2024-01-25 NOTE — Discharge Instructions (Signed)
 You were seen in the ER today for concerns of abdominal pain and weakness. Your labs and imaging are thankfully reassuring with no abnormal findings seen to explain your current pain. Continue to monitor your symptoms for the next several days and return for concerns of worsening symptoms. Please follow up with your primary care provider for further assessment.

## 2024-01-27 NOTE — Telephone Encounter (Signed)
 I  can see her for both sleep and pulmonary

## 2024-01-28 NOTE — Progress Notes (Unsigned)
 GYNECOLOGY VISIT  Patient name: Lydia Gross MRN [id number]  Date of birth: Jul 31, 1976 Chief Complaint:   Procedure  History:  Lydia Gross is a 48 y.o. [phone] being seen today for colposcopy and endometrial biopsy.  Prescribed lysted but forgot to pick up so has not tried it.   Seen 11/2023 by AB: heavy regular menses, trial of lysted and iubprofen, US  ordered The following portions of the patient's history were reviewed and updated as appropriate: allergies, current medications, past family history, past medical history, past social history, past surgical history and problem list.   Health Maintenance:   Last pap     Component Value Date/Time   DIAGPAP  07/11/2023 0944    - Negative for intraepithelial lesion or malignancy (NILM)   DIAGPAP  04/19/2022 1508    - Negative for Intraepithelial Lesions or Malignancy (NILM)   DIAGPAP - Benign reactive/reparative changes 04/19/2022 1508   HPVHIGH Positive (A) 07/11/2023 0944   HPVHIGH Positive (A) 04/19/2022 1508   ADEQPAP  07/11/2023 0944    Satisfactory for evaluation; transformation zone component ABSENT.   ADEQPAP  04/19/2022 1508    Satisfactory for evaluation; transformation zone component PRESENT.   Review of Systems:  Pertinent items are noted in HPI. Comprehensive review of systems was otherwise negative.   Objective:  Physical Exam BP 131/82 (BP Location: Right Arm)   Pulse 70   Ht 5\' 9"  (1.753 m)   Wt (!) 329 lb (149.2 kg)   BMI 48.58 kg/m    Physical Exam Vitals and nursing note reviewed. Exam conducted with a chaperone present.  Constitutional:      Appearance: Normal appearance.  HENT:     Head: Normocephalic and atraumatic.  Pulmonary:     Effort: Pulmonary effort is normal.     Breath sounds: Normal breath sounds.  Genitourinary:    General: Normal vulva.     Exam position: Lithotomy position.     Vagina: Normal.     Cervix: Normal.  Skin:    General: Skin is warm and dry.   Neurological:     General: No focal deficit present.     Mental Status: She is alert.  Psychiatric:        Mood and Affect: Mood normal.        Behavior: Behavior normal.        Thought Content: Thought content normal.        Judgment: Judgment normal.    Labs and Imaging  FINDINGS: The uterus is anteverted in position and measures 11.4 x 7.0 x 6.4 cm. It demonstrates a normal, homogeneous echotexture. The endometrium measures 1.7 cm and demonstrates a normal homogeneous echotexture. Small nabothian cysts are noted.   The ovaries are obscured by overlying bowel gas.   There is no fluid present within the cul-de-sac.   IMPRESSION: 1. Thickened endometrium measures up to 1.7 cm. Nonvisualization of the ovaries.   Thank you for allowing us  to assist in the care of this patient.   GYNECOLOGY OFFICE COLPOSCOPY PROCEDURE NOTE  48 y.o. [id number] here for colposcopy for  NILM and high risk HPV for a second time  pap smear on 06/2023. Discussed role for HPV in cervical dysplasia, need for surveillance.  Patient gave informed written consent, time out was performed.  Placed in lithotomy position. Cervix viewed with speculum and colposcope after application of acetic acid.   Colposcopy adequate? Yes  no visible lesions, no mosaicism, no punctation, and no abnormal vasculature; corresponding  biopsies obtained.  ECC specimen obtained. All specimens were labeled and sent to pathology.  Chaperone was present during entire procedure.   Endometrial Biopsy Procedure  Patient identified, informed consent performed,  indication reviewed, consent signed.  Reviewed risk of perforation, pain, bleeding, insufficient sample, etc were reviewd. Time out was performed.  Urine pregnancy test negative.  Following colposcopy.  Cleaned with Betadine x 2.  Anterior cervix grasped anteriorly with a single tooth tenaculum.  Paracervical block was incompletely administered (unilateral administration).   Endometrial pipelle was used to draw up 1cc of 1% lidocaine, introduced into the cervical os and instilled into the endometrial cavity.  The pipelle was passed twice without difficulty and sample obtained. Tenaculum was removed, good hemostasis noted.  Patient tolerated procedure well.  Patient was given post-procedure instructions.     Assessment & Plan:   1. 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 Menorrhagia with regular cycle Recommend trial of lysteda with upcoming menses.  - Surgical pathology  3. Abnormal uterine bleeding Now s/p uncomplicated EMB, will follow up surgical pathology.  - Surgical pathology  Routine preventative health maintenance measures emphasized.  Lydia Pelton, MD [facility]imally In[facility] for [facility], [facility]

## 2024-01-29 ENCOUNTER — Encounter: Payer: Self-pay | Admitting: Obstetrics and Gynecology

## 2024-01-29 ENCOUNTER — Other Ambulatory Visit (HOSPITAL_COMMUNITY)
Admission: RE | Admit: 2024-01-29 | Discharge: 2024-01-29 | Disposition: A | Discharge status: Home / Self Care | Source: Ambulatory Visit | Attending: Obstetrics and Gynecology | Admitting: Obstetrics and Gynecology

## 2024-01-29 ENCOUNTER — Ambulatory Visit: Admitting: Obstetrics and Gynecology

## 2024-01-29 VITALS — BP 131/82 | HR 70 | Ht 69.0 in | Wt 329.0 lb

## 2024-01-29 DIAGNOSIS — B977 Papillomavirus as the cause of diseases classified elsewhere: Secondary | ICD-10-CM | POA: Diagnosis present

## 2024-01-29 DIAGNOSIS — N72 Inflammatory disease of cervix uteri: Secondary | ICD-10-CM | POA: Insufficient documentation

## 2024-01-29 DIAGNOSIS — N920 Excessive and frequent menstruation with regular cycle: Secondary | ICD-10-CM

## 2024-01-29 DIAGNOSIS — R87810 Cervical high risk human papillomavirus (HPV) DNA test positive: Secondary | ICD-10-CM | POA: Diagnosis not present

## 2024-01-29 DIAGNOSIS — N939 Abnormal uterine and vaginal bleeding, unspecified: Secondary | ICD-10-CM

## 2024-01-30 ENCOUNTER — Encounter: Payer: Self-pay | Admitting: Obstetrics and Gynecology

## 2024-01-30 LAB — SURGICAL PATHOLOGY

## 2024-02-27 ENCOUNTER — Emergency Department (HOSPITAL_BASED_OUTPATIENT_CLINIC_OR_DEPARTMENT_OTHER)
Admission: EM | Admit: 2024-02-27 | Discharge: 2024-02-27 | Disposition: A | Discharge status: Home / Self Care | Attending: Emergency Medicine | Admitting: Emergency Medicine

## 2024-02-27 ENCOUNTER — Encounter (HOSPITAL_BASED_OUTPATIENT_CLINIC_OR_DEPARTMENT_OTHER): Payer: Self-pay

## 2024-02-27 ENCOUNTER — Emergency Department (HOSPITAL_BASED_OUTPATIENT_CLINIC_OR_DEPARTMENT_OTHER): Admitting: Radiology

## 2024-02-27 ENCOUNTER — Other Ambulatory Visit: Payer: Self-pay

## 2024-02-27 DIAGNOSIS — R0789 Other chest pain: Secondary | ICD-10-CM | POA: Insufficient documentation

## 2024-02-27 DIAGNOSIS — Z79899 Other long term (current) drug therapy: Secondary | ICD-10-CM | POA: Diagnosis not present

## 2024-02-27 DIAGNOSIS — Z87891 Personal history of nicotine dependence: Secondary | ICD-10-CM | POA: Diagnosis not present

## 2024-02-27 DIAGNOSIS — I10 Essential (primary) hypertension: Secondary | ICD-10-CM | POA: Diagnosis not present

## 2024-02-27 DIAGNOSIS — R0981 Nasal congestion: Secondary | ICD-10-CM | POA: Diagnosis not present

## 2024-02-27 DIAGNOSIS — J029 Acute pharyngitis, unspecified: Secondary | ICD-10-CM

## 2024-02-27 DIAGNOSIS — R051 Acute cough: Secondary | ICD-10-CM | POA: Diagnosis present

## 2024-02-27 DIAGNOSIS — D72819 Decreased white blood cell count, unspecified: Secondary | ICD-10-CM | POA: Diagnosis not present

## 2024-02-27 LAB — TROPONIN T, HIGH SENSITIVITY: Troponin T High Sensitivity: <15 ng/L (ref <19)

## 2024-02-27 LAB — BASIC METABOLIC PANEL WITH GFR
Anion gap: 9 (ref 5–15)
BUN: 14 mg/dL (ref 6–20)
CO2: 28 mmol/L (ref 22–32)
Calcium: 9.1 mg/dL (ref 8.9–10.3)
Chloride: 104 mmol/L (ref 98–111)
Creatinine, Ser: 0.92 mg/dL (ref 0.44–1.00)
GFR, Estimated: >60 mL/min (ref >60)
Glucose, Bld: 104 mg/dL — ABNORMAL HIGH (ref 70–99)
Potassium: 3.9 mmol/L (ref 3.5–5.1)
Sodium: 140 mmol/L (ref 135–145)

## 2024-02-27 LAB — CBC
HCT: 41.9 % (ref 36.0–46.0)
Hemoglobin: 13.9 g/dL (ref 12.0–15.0)
MCH: 28.4 pg (ref 26.0–34.0)
MCHC: 33.2 g/dL (ref 30.0–36.0)
MCV: 85.5 fL (ref 80.0–100.0)
Platelets: 199 10*3/uL (ref 150–400)
RBC: 4.9 MIL/uL (ref 3.87–5.11)
RDW: 14.1 % (ref 11.5–15.5)
WBC: 3.5 10*3/uL — ABNORMAL LOW (ref 4.0–10.5)
nRBC: 0 % (ref 0.0–0.2)

## 2024-02-27 LAB — RESP PANEL BY RT-PCR (RSV, FLU A&B, COVID)  RVPGX2
Influenza A by PCR: NEGATIVE
Influenza B by PCR: NEGATIVE
Resp Syncytial Virus by PCR: NEGATIVE
SARS Coronavirus 2 by RT PCR: NEGATIVE

## 2024-02-27 MED ORDER — BENZONATATE 100 MG PO CAPS: 100.0000 mg | ORAL_CAPSULE | Freq: Three times a day (TID) | ORAL | 0 refills | Status: DC | PRN

## 2024-02-27 MED ORDER — TRIAMCINOLONE ACETONIDE 55 MCG/ACT NA AERO: 2.0000 | INHALATION_SPRAY | Freq: Every day | NASAL | 0 refills | Status: AC

## 2024-02-27 MED ORDER — AZITHROMYCIN 250 MG PO TABS: 250.0000 mg | ORAL_TABLET | Freq: Every day | ORAL | 0 refills | Status: DC

## 2024-02-27 MED ORDER — CELECOXIB 200 MG PO CAPS: 200.0000 mg | ORAL_CAPSULE | Freq: Two times a day (BID) | ORAL | 0 refills | Status: DC | PRN

## 2024-02-27 NOTE — ED Triage Notes (Addendum)
 Pt to ED c/o cold symptoms x 1 week, sore throat, nasal congestion, cough,   also reports substernal chest pain with movement intermittent x 1 year

## 2024-02-27 NOTE — Discharge Instructions (Signed)
 As discussed, your workup today was overall reassuring.  Your heart was normal and EKG appeared normal; does not appear like you are having a heart attack.  Chest x-ray did not show obvious pneumonia, fluid on the lung, collapsed lung or other abnormality.  You tested negative for COVID, flu, RSV.  We recommend treatment of your nasal congestion with nasal steroid spray as prescribed.  You may continue to take over-the-counter allergy medicine.  Will send in medicine called Celebrex to take as needed for chest wall pain as well as your sore throat.  Will recommend Cepacol throat lozenges to help soothe the throat as well.  Will also begin a Z-Pak as we discussed.  Recommend follow-up with your primary care for reassessment.  Please not hesitate to return to emergency department if the worrisome signs and symptoms we discussed become apparent.

## 2024-02-27 NOTE — ED Provider Notes (Signed)
[place] EMERGENCY DEPARTMENT AT [facility] Provider Note   CSN: [id number] Arrival date & time: 02/27/24  0944     History  Chief Complaint  Patient presents with   URI    PAIDYN MCFERRAN is a 48 y.o. female.   URI   48 year old female presents emergency department with 1 week of cough, sore throat, nasal congestion.  Has been trying over-the-counter allergy medicine without significant improvement.  Patient also reporting left-sided chest discomfort.  States that she has been noticing this intermittently over the past year or so.  States it is worsened with movement of her left upper extremity.  Denies any fevers, chills, shortness of breath, abdominal pain, nausea, vomiting.  Past medical history significant for acid reflux, hypertension, obesity  Home Medications Prior to Admission medications   Medication Sig Start Date End Date Taking? Authorizing Provider  azithromycin  (ZITHROMAX ) 250 MG tablet Take 1 tablet (250 mg total) by mouth daily. Take first 2 tablets together, then 1 every day until finished. 02/27/24  Yes Neil Balls A, PA  benzonatate  (TESSALON ) 100 MG capsule Take 1 capsule (100 mg total) by mouth 3 (three) times daily as needed. 02/27/24  Yes Neil Balls A, PA  celecoxib  (CELEBREX ) 200 MG capsule Take 1 capsule (200 mg total) by mouth 2 (two) times daily as needed. 02/27/24  Yes Neil Balls A, PA  triamcinolone  (NASACORT ) 55 MCG/ACT AERO nasal inhaler Place 2 sprays into the nose daily. 02/27/24  Yes Neil Balls A, PA  baclofen (LIORESAL) 10 MG tablet Take 10 mg by mouth 2 (two) times daily.    [provider]  hydrochlorothiazide (HYDRODIURIL) 25 MG tablet Take 25 mg by mouth every morning.     [provider]  ibuprofen  (ADVIL ) 600 MG tablet Take 1 tablet (600 mg total) by mouth every 6 (six) hours as needed for moderate pain (pain score 4-6) or cramping. 12/02/23   Abigail Abler, MD  LINZESS 145 MCG CAPS capsule  Take 145 mcg by mouth daily.    [provider]  meloxicam  (MOBIC ) 15 MG tablet Take by mouth as needed. 06/05/23   [provider]  olmesartan (BENICAR) 5 MG tablet Take 20 mg by mouth daily. 02/26/23   [provider]  omeprazole (PRILOSEC) 20 MG capsule Take 20 mg by mouth daily.    [provider]  predniSONE  (DELTASONE ) 20 MG tablet Take 2 tablets (40 mg total) by mouth daily. Patient not taking: Reported on 01/29/2024 01/24/24   Barrett, Kandace Organ, PA-C  tranexamic acid  (LYSTEDA ) 650 MG TABS tablet Take 2 tablets (1,300 mg total) by mouth 3 (three) times daily. Take during menses for a maximum of five days Patient not taking: Reported on 01/29/2024 12/02/23   Abigail Abler, MD      Allergies    Amoxicillin    Review of Systems   Review of Systems  All other systems reviewed and are negative.   Physical Exam Updated Vital Signs BP 114/86 (BP Location: Right Arm)   Pulse 69   Temp 98.1 F (36.7 C)   Resp 17   Ht 5\' 10"  (1.778 m)   Wt (!) 147.4 kg   SpO2 100%   BMI 46.63 kg/m  Physical Exam Vitals and nursing note reviewed.  Constitutional:      General: She is not in acute distress.    Appearance: She is well-developed.  HENT:     Head: Normocephalic and atraumatic.  Nose: Congestion and rhinorrhea present.     Mouth/Throat:     Comments: Mild posterior pharyngeal erythema.  Uvula midline with symmetrical phonation.  No sublingual or symptoms or swelling.  No trismus.  Tonsils 1+ without exudate. Eyes:     Conjunctiva/sclera: Conjunctivae normal.  Cardiovascular:     Rate and Rhythm: Normal rate and regular rhythm.     Pulses: Normal pulses.     Heart sounds: No murmur heard. Pulmonary:     Effort: Pulmonary effort is normal. No respiratory distress.     Breath sounds: Normal breath sounds. No wheezing, rhonchi or rales.     Comments: Left-sided chest wall tenderness.  Pain exacerbated with resisted horizontal adduction and left  upper extremity with shoulder at 90 degree flexion. Chest:     Chest wall: Tenderness present.  Abdominal:     Palpations: Abdomen is soft.     Tenderness: There is no abdominal tenderness.  Musculoskeletal:        General: No swelling.     Cervical back: Neck supple.     Right lower leg: No edema.     Left lower leg: No edema.  Skin:    General: Skin is warm and dry.     Capillary Refill: Capillary refill takes less than 2 seconds.  Neurological:     Mental Status: She is alert.  Psychiatric:        Mood and Affect: Mood normal.     ED Results / Procedures / Treatments   Labs (all labs ordered are listed, but only abnormal results are displayed) Labs Reviewed  BASIC METABOLIC PANEL WITH GFR - Abnormal; Notable for the following components:      Result Value   Glucose, Bld 104 (*)    All other components within normal limits  CBC - Abnormal; Notable for the following components:   WBC 3.5 (*)    All other components within normal limits  RESP PANEL BY RT-PCR (RSV, FLU A&B, COVID)  RVPGX2  TROPONIN T, HIGH SENSITIVITY    EKG None  Radiology DG Chest 2 View Result Date: 02/27/2024 CLINICAL DATA:  Chest pain. EXAM: CHEST - 2 VIEW COMPARISON:  01/24/2024. FINDINGS: Bilateral lung fields are clear. Bilateral costophrenic angles are clear. Normal cardio-mediastinal silhouette. No acute osseous abnormalities. The soft tissues are within normal limits. IMPRESSION: No active cardiopulmonary disease. Electronically Signed   By: Beula Brunswick M.D.   On: 02/27/2024 10:27    Procedures Procedures    Medications Ordered in ED Medications - No data to display  ED Course/ Medical Decision Making/ A&P                                 Medical Decision Making Amount and/or Complexity of Data Reviewed Labs: ordered. Radiology: ordered.  Risk OTC drugs. Prescription drug management.   This patient presents to the ED for concern of cough, congestion, chest wall pain, this  involves an extensive number of treatment options, and is a complaint that carries with it a high risk of complications and morbidity.  The differential diagnosis includes COVID, flu, RSV, pneumonia, ACS, PE, GERD, pneumothorax, other   Co morbidities that complicate the patient evaluation  See HPI   Additional history obtained:  Additional history obtained from EMR External records from outside source obtained and reviewed including hospital records   Lab Tests:  I Ordered, and personally interpreted labs.  The pertinent results include:  Leukopenia 3.5.  No evidence of anemia.  Platelets within range.  No Electra abnormalities.  No renal dysfunction.  Troponin of less than 15.  Viral testing negative.   Imaging Studies ordered:  I ordered imaging studies including chest x-ray I independently visualized and interpreted imaging which showed no acute cardiopulmonary abnormality I agree with the radiologist interpretation   Cardiac Monitoring: / EKG:  The patient was maintained on a cardiac monitor.  I personally viewed and interpreted the cardiac monitored which showed an underlying rhythm of: Normal sinus rhythm.T wave changes.   Consultations Obtained:  N/a   Problem List / ED Course / Critical interventions / Medication management  Nasal congestion, cough, sore throat, chest wall pain Reevaluation of the patient showed that the patient stayed the same I have reviewed the patients home medicines and have made adjustments as needed   Social Determinants of Health:  Former cigarette use.  Denies illicit drug use.   Test / Admission - Considered:  Nasal congestion, cough, sore throat, chest wall pain Vitals signs within normal range and stable throughout visit. Laboratory/imaging studies significant for: See above 48 year old female presents emergency department with 1 week of cough, sore throat, nasal congestion.  Has been trying over-the-counter allergy medicine  without significant improvement.  Patient also reporting left-sided chest discomfort.  States that she has been noticing this intermittently over the past year or so.  States it is worsened with movement of her left upper extremity.  Denies any fevers, chills, shortness of breath, abdominal pain, nausea, vomiting. On exam, lungs clear to auscultation bilaterally.  Patient with left-sided upper chest wall tenderness with pain exacerbated with horizontal adduction left shoulder.  Workup today reassuring.  Chest x-ray without obvious pneumonia, pneumothorax or other acute cardiopulmonary abnormality.  Troponin negative, EKG without any acute ischemic change; low suspicion for ACS.  Patient without chest pain rating to back, pulse deficits, neurodeficits, hypertension; low suspicion for dissection.  Patient with Wells PE 0 and PERC negative; low suspicion for PE.  Suspect patient's chest pain likely secondary to chest wall pain given physical exam findings.  Suspect that patient's other symptoms likely secondary to viral URI.  Will recommend symptomatic therapy as described in AVS and close follow-up with PCP in the outpatient setting.  Treatment plan discussed with patient and she acknowledged understanding was agreeable to said plan.  Patient will well-appearing, afebrile in no acute distress. Worrisome signs and symptoms were discussed with the patient, and the patient acknowledged understanding to return to the ED if noticed. Patient was stable upon discharge.          Final Clinical Impression(s) / ED Diagnoses Final diagnoses:  Acute cough  Nasal congestion  Chest wall pain    Rx / DC Orders ED Discharge Orders          Ordered    azithromycin  (ZITHROMAX ) 250 MG tablet  Daily        02/27/24 1101    benzonatate  (TESSALON ) 100 MG capsule  3 times daily PRN        02/27/24 1101    triamcinolone  (NASACORT ) 55 MCG/ACT AERO nasal inhaler  Daily        02/27/24 1101    celecoxib  (CELEBREX )  200 MG capsule  2 times daily PRN        02/27/24 1101              [place], [place] 02/27/24 1156    Afton Horse T, DO 03/02/24  2250  

## 2024-02-29 NOTE — Progress Notes (Signed)
 03/02/24- 47 yoF followed for excessive somnolence. Transfer to me from Dr Gaynell Keeler. Medical problem list includes Obesity, GERD, HTN, Thyroid  Nodule,  Epworth score- Body weight today- Was to have sleep study- not done ED 5/15 for recurrent cough, MSCWP,  CXR 02/27/24- IMPRESSION: No active cardiopulmonary disease UC visit 5/15- note reviewed. Discussed the use of AI scribe software for clinical note transcription with the patient, who gave verbal consent to proceed.  History of Present Illness   Lydia Gross is a 48 year old female who presents with persistent sore throat and chest pain.  She has experienced a persistent sore throat with a sensation of mucus in the throat since last year. Azithromycin  provided slight improvement, but the sore throat persists.  She has had ENT and GI evaluations- unrevealing.  Chest pain is localized to the upper left anterior chest wall, tender to touch, and worsens when lying on it. She quit smoking last year when she initially became ill but resumed earlier this year, coinciding with the recurrence of her symptoms. CXR was normal.  She has bronchitis associated with breathing difficulties and a sensation of drainage in her throat and neck. She experiences occasional shortness of breath but denies consistent respiratory distress. She was given an albuterol  inhaler but has not used it, as her breathing improved with antibiotics. A lymph node on the left side of her neck  swells during episodes of breathing difficulty.      Assessment and Plan:    Bronchitis Chronic bronchitis with recurrent pharyngitis and mucus production, exacerbated by tobacco use. ENT ruled out sinusitis. Symptoms improve with smoking cessation. - Order CT scan of the chest. - Order blood count with differential. - Schedule pulmonary function test. - Refer to allergist. - Advise smoking cessation.   Chest wall pain - nonspecific, present at least intermittently over past  year and affected by direct pressure or use of L arm. -CT chest     ROS-see HPI   Negative unless "+" Constitutional:    weight loss, night sweats, fevers, chills, fatigue, lassitude. HEENT:    headaches, difficulty swallowing, tooth/dental problems, sore throat,       sneezing, itching, ear ache, nasal congestion, post nasal drip, snoring CV:    chest pain, orthopnea, PND, swelling in lower extremities, anasarca,                                  dizziness, palpitations Resp:   shortness of breath with exertion or at rest.                productive cough,   non-productive cough, coughing up of blood.              change in color of mucus.  wheezing.   Skin:    rash or lesions. GI:  No-   heartburn, indigestion, abdominal pain, nausea, vomiting, diarrhea,                 change in bowel habits, loss of appetite GU: dysuria, change in color of urine, no urgency or frequency.   flank pain. MS:   joint pain, stiffness, decreased range of motion, back pain. Neuro-     nothing unusual Psych:  change in mood or affect.  depression or anxiety.   memory loss.  OBJ- Physical Exam General- Alert, Oriented, Affect-flat/ depressed, Distress- none acute, +morbidly obese Skin- rash-none, lesions- none, excoriation- none Lymphadenopathy- none  Head- atraumatic            Eyes- Gross vision intact, PERRLA, conjunctivae and secretions clear            Ears- Hearing, canals-normal            Nose- Clear, no-Septal dev, mucus, polyps, erosion, perforation             Throat- Mallampati III , mucosa clear , drainage- none, tonsils- atrophic Neck- flexible , trachea midline, no stridor , thyroid  nl, carotid no bruit Chest - symmetrical excursion , unlabored           Heart/CV- RRR , no murmur , no gallop  , no rub, nl s1 s2                           - JVD- none , edema- none, stasis changes- none, varices- none           Lung- clear to P&A, wheeze- none, cough- none , dullness-none, rub- none            Chest wall-  Abd-  Br/ Gen/ Rectal- Not done, not indicated Extrem- cyanosis- none, clubbing, none, atrophy- none, strength- nl Neuro- grossly intact to observation

## 2024-03-02 ENCOUNTER — Encounter: Payer: Self-pay | Admitting: Internal Medicine

## 2024-03-02 ENCOUNTER — Ambulatory Visit: Admitting: Internal Medicine

## 2024-03-02 VITALS — BP 134/90 | HR 71 | Ht 69.0 in | Wt 331.0 lb

## 2024-03-02 DIAGNOSIS — Z87891 Personal history of nicotine dependence: Secondary | ICD-10-CM

## 2024-03-02 DIAGNOSIS — J0191 Acute recurrent sinusitis, unspecified: Secondary | ICD-10-CM | POA: Diagnosis not present

## 2024-03-02 DIAGNOSIS — R0789 Other chest pain: Secondary | ICD-10-CM

## 2024-03-02 DIAGNOSIS — J42 Unspecified chronic bronchitis: Secondary | ICD-10-CM | POA: Diagnosis not present

## 2024-03-02 LAB — CBC WITH DIFFERENTIAL/PLATELET
Basophils Absolute: 0 10*3/uL (ref 0.0–0.1)
Basophils Relative: 0.8 % (ref 0.0–3.0)
Eosinophils Absolute: 0.1 10*3/uL (ref 0.0–0.7)
Eosinophils Relative: 2.7 % (ref 0.0–5.0)
HCT: 39.8 % (ref 36.0–46.0)
Hemoglobin: 13.6 g/dL (ref 12.0–15.0)
Lymphocytes Relative: 41.4 % (ref 12.0–46.0)
Lymphs Abs: 2 10*3/uL (ref 0.7–4.0)
MCHC: 34.1 g/dL (ref 30.0–36.0)
MCV: 84.4 fl (ref 78.0–100.0)
Monocytes Absolute: 0.3 10*3/uL (ref 0.1–1.0)
Monocytes Relative: 6.7 % (ref 3.0–12.0)
Neutro Abs: 2.4 10*3/uL (ref 1.4–7.7)
Neutrophils Relative %: 48.4 % (ref 43.0–77.0)
Platelets: 246 10*3/uL (ref 150.0–400.0)
RBC: 4.71 Mil/uL (ref 3.87–5.11)
RDW: 12.8 % (ref 11.5–15.5)
WBC: 4.9 10*3/uL (ref 4.0–10.5)

## 2024-03-02 NOTE — Patient Instructions (Signed)
 Please quit smoking!!  Order-schedule CT chest no contrast      chronic bronchitis, left upper anterior chest wall pain  Order- lab- CBC w diff    dx chronic bronchitis  Order- schedule PFT     dx chronic bronchitis  Order- referral to [facility]- Dr Zenia Hight group   dx perennial rhinitis

## 2024-03-03 ENCOUNTER — Ambulatory Visit: Payer: Self-pay | Admitting: Internal Medicine

## 2024-03-17 ENCOUNTER — Encounter

## 2024-03-19 ENCOUNTER — Ambulatory Visit (HOSPITAL_BASED_OUTPATIENT_CLINIC_OR_DEPARTMENT_OTHER)
Admission: RE | Admit: 2024-03-19 | Discharge: 2024-03-19 | Disposition: A | Discharge status: Home / Self Care | Source: Ambulatory Visit | Attending: Internal Medicine | Admitting: Internal Medicine

## 2024-03-19 DIAGNOSIS — J42 Unspecified chronic bronchitis: Secondary | ICD-10-CM | POA: Diagnosis present

## 2024-03-20 ENCOUNTER — Encounter: Admitting: Internal Medicine

## 2024-03-20 ENCOUNTER — Ambulatory Visit: Admitting: Internal Medicine
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
## 2024-03-27 ENCOUNTER — Telehealth: Payer: Self-pay

## 2024-03-27 NOTE — Telephone Encounter (Signed)
 Sent results to per vis My chart

## 2024-03-27 NOTE — Telephone Encounter (Signed)
 Copied from CRM [id number]. Topic: Clinical - Lab/Test Results >> Mar 27, 2024 10:18 AM Evie Hoff wrote: Reason for CRM: patient is calling for her ct scan results . Please give patient a call about this information  [phone]

## 2024-03-30 NOTE — Telephone Encounter (Signed)
 Please bring Lydia Gross in for next available, held spot ok, so I can answer questions and make decisions together.

## 2024-03-30 NOTE — Telephone Encounter (Signed)
 Please bring Lydia Gross in for next available, held spot ok, so I can answer her questions and make decisions together.

## 2024-03-31 NOTE — Telephone Encounter (Signed)
 Called patient.  Made OV 04/02/2024 at 11:30 am with Dr. Linder Revere to review results.  Patient verbalized understanding.

## 2024-03-31 NOTE — Telephone Encounter (Signed)
 Called patient.  Made patient OV with Dr. Linder Revere on 04/02/2024 at 11:30 am.  Patient verbalized understanding.

## 2024-04-01 ENCOUNTER — Ambulatory Visit: Payer: Medicaid Other | Admitting: Neurology

## 2024-04-01 ENCOUNTER — Encounter: Payer: Self-pay | Admitting: Neurology

## 2024-04-01 ENCOUNTER — Ambulatory Visit: Admitting: Internal Medicine

## 2024-04-01 ENCOUNTER — Other Ambulatory Visit: Payer: Self-pay

## 2024-04-01 VITALS — BP 135/81 | HR 82 | Ht 69.0 in | Wt 329.0 lb

## 2024-04-01 DIAGNOSIS — R0683 Snoring: Secondary | ICD-10-CM | POA: Diagnosis not present

## 2024-04-01 DIAGNOSIS — R519 Headache, unspecified: Secondary | ICD-10-CM

## 2024-04-01 DIAGNOSIS — M489 Spondylopathy, unspecified: Secondary | ICD-10-CM | POA: Diagnosis not present

## 2024-04-01 DIAGNOSIS — M542 Cervicalgia: Secondary | ICD-10-CM | POA: Diagnosis not present

## 2024-04-01 DIAGNOSIS — G8929 Other chronic pain: Secondary | ICD-10-CM

## 2024-04-01 NOTE — Patient Instructions (Signed)
 I am referring you to a different sleep medicine specialist as I am concerned you have sleep apnea.  I'm glad you are not having headaches currently. Please let me know if you get headaches or vision changes.  Otherwise, you can follow up with me as needed.  The physicians and staff at [facility] are committed to providing excellent care. You may receive a survey requesting feedback about your experience at our office. We strive to receive very good responses to the survey questions. If you feel that your experience would prevent you from giving the office a very good  response, please contact our office to try to remedy the situation. We may be reached at [phone]. Thank you for taking the time out of your busy day to complete the survey.  Rommie Coats, MD [facility]

## 2024-04-01 NOTE — Progress Notes (Signed)
 03/02/24- 47 yoF followed for excessive somnolence. Transfer to me from Dr Neda. Medical problem list includes Obesity, GERD, HTN, Thyroid  Nodule,  Epworth score- Body weight today- Was to have sleep study- not done ED 5/15 for recurrent cough, MSCWP,  CXR 02/27/24- IMPRESSION: No active cardiopulmonary disease UC visit 5/15- note reviewed. Discussed the use of AI scribe software for clinical note transcription with the patient, who gave verbal consent to proceed.  History of Present Illness   Lydia Gross is a 48 year old female who presents with persistent sore throat and chest pain.  She has experienced a persistent sore throat with a sensation of mucus in the throat since last year. Azithromycin  provided slight improvement, but the sore throat persists.  She has had ENT and GI evaluations- unrevealing.  Chest pain is localized to the upper left anterior chest wall, tender to touch, and worsens when lying on it. She quit smoking last year when she initially became ill but resumed earlier this year, coinciding with the recurrence of her symptoms. CXR was normal.  She has bronchitis associated with breathing difficulties and a sensation of drainage in her throat and neck. She experiences occasional shortness of breath but denies consistent respiratory distress. She was given an albuterol  inhaler but has not used it, as her breathing improved with antibiotics. A lymph node on the left side of her neck  swells during episodes of breathing difficulty.      Assessment and Plan:    Bronchitis Chronic bronchitis with recurrent pharyngitis and mucus production, exacerbated by tobacco use. ENT ruled out sinusitis. Symptoms improve with smoking cessation. - Order CT scan of the chest. - Order blood count with differential. - Schedule pulmonary function test. - Refer to allergist. - Advise smoking cessation.  Chest wall pain - nonspecific, present at least intermittently over past year  and affected by direct pressure or use of L arm. -CT chest     04/02/24- 47 yoF Smoker( says she has quit again) followed for Snoring, excessive somnolence, Abnormal Chest Ct,  Complicated by Obesity, GERD, HTN, Thyroid  Nodule, Tobacco Use Body weight today- ED 5/15 for 1 week cough, rhinitis> Zpak, tessalon , flonase .          She wanted earlier attention to abnl CT She has had ENT and GI evaluations- unrevealing for complaints of throat pain and mucus in throat. Neurology referred back for snoring. CT chest 03/19/24-  IMPRESSION: 1. No acute noncontrast chest CT findings. 2. Nonspecific interval increased hazy thymic tissue in the substernal space. Correlate clinically for thymic rebound or hyperplasia. This does not have a masslike appearance, but early presentation of thymic neoplasm could appear similar. Follow-up CT 6-12 months is suggested. 3. Stable shotty subcentimeter mediastinal nodes. No enlarged lymph nodes identified without contrast.  Discussed the use of AI scribe software for clinical note transcription with the patient, who gave verbal consent to proceed.  History of Present Illness   Lydia Gross is a 48 year old female with hiatal hernia and acid reflux who presents with chest pain and difficulty swallowing.  She experiences recurrent chest pains left upper anterior of her chest with a burning sensation. Known bad GERD. She had been told to take omeprazole twice daily, but has been using just once daily. Cardiac eval at recent ED visit- benign.  A recent CT scan showed haziness behind her breastbone, but no specific findings were identified. She has difficulty swallowing large pills, which seem to get stuck in the upper  part of her sternum, accompanied by a burning sensation linked to her acid reflux. A barium swallow test conducted a couple of months ago showed no abnormalities. She is currently taking omeprazole for acid reflux. Her swallowing difficulties worsened  after resuming smoking earlier this year but have improved since she stopped smoking. No current smoking.     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   Gastroesophageal reflux disease (GERD) Chronic GERD with pyrosis and dysphagia, improved post-smoking cessation. Current omeprazole management unsatisfactory. -take omeprazole twice daily before meals -Discuss with PCP  Hiatal hernia Hiatal hernia with persistent symptoms despite previous GI evaluation. Symptoms improved post-smoking cessation.     She feels there is a lot going on  and wanted some clarification about the ?thymus disease before turning attention to getting home sleep test done. If PET  is not helpful, consider getting thoracic surgical evaluation of thymus-area shadow.    ROS-see HPI   + = positive Constitutional:    weight loss, night sweats, fevers, chills, fatigue, lassitude. HEENT:    headaches, difficulty swallowing, tooth/dental problems, sore throat,       sneezing, itching, ear ache, nasal congestion, post nasal drip, snoring CV:    chest pain, orthopnea, PND, swelling in lower extremities, anasarca,                                   dizziness, palpitations Resp:   shortness of breath with exertion or at rest.                productive cough,   non-productive cough, coughing up of blood.              change in color of mucus.  wheezing.   Skin:    rash or lesions. GI:  No-   heartburn, indigestion, abdominal pain, nausea, vomiting, diarrhea,                 change in bowel habits, loss of appetite GU: dysuria, change in color of urine, no urgency or frequency.   flank pain. MS:   joint pain, stiffness, decreased range of motion, back pain. Neuro-     nothing unusual Psych:  change in mood or affect.  depression or anxiety.   memory loss.  OBJ- Physical  Exam General- Alert, Oriented, Affect-flat/ depressed, Distress- none acute, +morbidly obese Skin- rash-none, lesions- none, excoriation- none Lymphadenopathy- none Head- atraumatic            Eyes- Gross vision intact, PERRLA, conjunctivae and secretions clear            Ears- Hearing, canals-normal            Nose- Clear, no-Septal dev, mucus, polyps, erosion, perforation             Throat- Mallampati III , mucosa clear , drainage- none, tonsils- atrophic Neck- flexible , trachea midline, no stridor , thyroid  nl, carotid no bruit Chest - symmetrical excursion , unlabored           Heart/CV- RRR , no murmur , no gallop  , no rub, nl s1 s2                           - JVD- none ,  edema- none, stasis changes- none, varices- none           Lung- clear to P&A, wheeze- none, cough- none , dullness-none, rub- none           Chest wall-  Abd-  Br/ Gen/ Rectal- Not done, not indicated Extrem- cyanosis- none, clubbing, none, atrophy- none, strength- nl Neuro- grossly intact to observation

## 2024-04-02 ENCOUNTER — Ambulatory Visit: Admitting: Internal Medicine

## 2024-04-02 ENCOUNTER — Encounter: Payer: Self-pay | Admitting: Internal Medicine

## 2024-04-02 VITALS — BP 118/72 | HR 73 | Temp 98.2°F | Ht 69.0 in | Wt 331.2 lb

## 2024-04-02 DIAGNOSIS — R0789 Other chest pain: Secondary | ICD-10-CM | POA: Diagnosis not present

## 2024-04-02 DIAGNOSIS — J42 Unspecified chronic bronchitis: Secondary | ICD-10-CM

## 2024-04-02 DIAGNOSIS — F17210 Nicotine dependence, cigarettes, uncomplicated: Secondary | ICD-10-CM | POA: Diagnosis not present

## 2024-04-02 DIAGNOSIS — R9389 Abnormal findings on diagnostic imaging of other specified body structures: Secondary | ICD-10-CM

## 2024-04-02 NOTE — Patient Instructions (Signed)
 Order- schedule PET scan neck to thigh     dx abnormal chest CT  Recommend you go back to taking your omeprazole twice daily before breakfast and supper. If you keep having heart burn/ and chest pains, then you need to see your GI doctor again.

## 2024-04-03 ENCOUNTER — Ambulatory Visit: Admitting: Internal Medicine

## 2024-04-13 NOTE — Progress Notes (Signed)
 [address] [place] [address] [phone]  Follow-up   Subjective   Patient ID:  Lydia Gross is a 48 y.o. (DOB 09-16-1976) female.   CC:     Patient presents with  . Gastroesophageal Reflux  . Dysphagia  . Nausea     HPI:  The patient is a pleasant 48 year old female that has a history of hypertension, gout, GERD, IBS that was seen 04/08/2024.  EGD August 2024 revealed gastritis and biopsies negative for H. pylori.  Empiric dilatation could not be performed because of hypoxia related to sleep apnea.  We repeated EGD Feb 21, 2023 where esophagus was dilated with 64 [place].  Colonoscopy November 2024 with internal hemorrhoids placed on 10-year recall.  Patient has been on Linzess for constipation and PPI for GERD.  At last visit she had pill dysphagia.  We discussed a modified barium swallow and patient was unable to tolerate the manometry.    Currently the patient reports she still has trouble with swallowing in her midesophagus with sometimes solids and sometimes pills.  She has not set up the modified barium swallow that Dr. Amelie ordered and she says she has not had a call from them.  I reviewed labs 02/07/2024 with a normal CBC, CMP, TSH.  She stopped Nexium as she felt like it was not helping her symptoms and her PCP put her on Pepcid  40 mg daily.  She seems to be doing some better with that.  She is taking Linzess 145 mcg daily which keeps her bowels moving daily.  Review of Systems:  Except as stated in the HPI, all other systems reviewed and are negative.   Past Medical History:  Diagnosis Date  . Cough    History of Cough  . Depressive disorder, not elsewhere classified    History of Depression  . GERD (gastroesophageal reflux disease)   . Gout   . Spasm of muscle    History of Muscle Spasm  . Unspecified disease of hair and hair follicles    History of Disease Of Hair And Hair Follicles  . Unspecified essential hypertension 12/01/2010  .  Unspecified viral infection, in conditions classified elsewhere and of unspecified site    History of Viral Infection   Social History   Socioeconomic History  . Marital status: Single  . Number of children: 3  Tobacco Use  . Smoking status: Never    Passive exposure: Never  . Smokeless tobacco: Never  Vaping Use  . Vaping status: Never Used  Substance and Sexual Activity  . Alcohol use: Not Currently    Comment: 1-2/wk; 1 or 2 drinks each time  . Drug use: No  . Sexual activity: Not Currently  Social History Narrative   Working Part-time   Caffeine Use   Marital History - Single   Past Surgical History:  Procedure Laterality Date  . Cesarean section    . Cholecystectomy    . Colonoscopy  08/19/2023  . Thyroid  surgery     biopsy  . Tubal ligation  10/15/2004  . Upper gastrointestinal endoscopy  02/21/2023   [facility]   Family History  Problem Relation Age of Onset  . Hypertension Mother   . No Known Problems Sister   . Stroke Maternal Grandfather   . Hypertension Maternal Grandfather   . Heart disease Maternal Grandfather   . Arthritis Maternal Grandfather   . ADD / ADHD Son   . Mental illness Son   . ADD / ADHD Son   .  Mental illness Son   . Sickle cell trait Son   . Sickle cell anemia Cousin   . Colon cancer Neg Hx   . Colon polyps Neg Hx     Medications: Outpatient Medications Marked as Taking for the 04/23/24 encounter (Office Visit) with Helene LABOR Pleasant, PA  Medication Sig Dispense Refill  . atorvastatin (LIPITOR) 20 mg tablet Take one tablet (20 mg dose) by mouth daily. 90 tablet 1  . ergocalciferol (VITAMIN D2) 50,000 units CAPS capsule TAKE 1 CAPSULE BY MOUTH TWICE A WEEK. 24 capsule 1  . famotidine  (PEPCID ) 40 mg tablet TAKE 1 TABLET(40 MG) BY MOUTH EVERY EVENING 90 tablet 0  . hydroCHLOROthiazide 25 mg tablet Take one tablet (25 mg dose) by mouth daily. 1/2  tablet daily. 90 tablet 2  . ibuprofen  (ADVIL ,MOTRIN ) 600 mg tablet Take one tablet (600 mg dose)  by mouth every 6 (six) hours as needed.    . linaclotide (LINZESS) 145 mcg capsule Take one capsule (145 mcg dose) by mouth every morning.    . meloxicam  (MOBIC ) 15 mg tablet Take one tablet (15 mg dose) by mouth daily. 30 tablet 2  . Multiple Vitamin (MULTIVITAMIN) tablet Take one tablet by mouth daily.    SABRA olmesartan (BENICAR) 20 MG tablet Take one tablet (20 mg dose) by mouth daily. 90 tablet 1     Allergies  Allergen Reactions  . Amoxicillin Other    Pt reports it makes me feel crazy     Objective   BP 132/82 (BP Location: Left Lower Arm, Patient Position: Sitting)   Pulse 68   Temp 98.5 F (36.9 C) (Oral)   Ht 5' 10 (1.778 m)   Wt (!) 334 lb (151.5 kg)   LMP 03/27/2024 (Approximate)   BMI 47.92 kg/m   General appearance: alert, appears stated age, and cooperative Head: Normocephalic, without obvious abnormality, atraumatic Lungs: clear to auscultation bilaterally Heart: regular rate and rhythm, S1, S2 normal, no murmur, click, rub or gallop Abdomen: soft, non-tender; bowel sounds normal; no masses,  no organomegaly Extremities: extremities normal, atraumatic, no cyanosis or edema Pulses: 2+ and symmetric Skin: Skin color, texture, turgor normal. No rashes or lesions    Assessment   1. Dysphagia, unspecified type   2. Gastroesophageal reflux disease, unspecified whether esophagitis present   3. Irritable bowel syndrome with constipation   4. Nausea   5. Epigastric pain   6. Constipation, unspecified constipation type   7. Odynophagia   8. Fatty liver   9. Atypical chest pain   10. Dyspepsia     Dysphagia-unfortunately she is continue to have some issues.  Her barium swallow showed a hiatal hernia and some reflux.  EGD last year with dilatation was not successful.  She has seen ENT and had soft tissue scans of her neck and chest.  Modified barium swallow is pending at this time and I have recommended she get that arranged.  Will continue with famotidine  40 mg  daily which seems to be helping some.  She is quite frustrated today and we did discuss a possible referral to [facility] for another opinion pending results of modified barium swallow.  Constipation-she will continue the Linzess daily for now. Plan   New Prescriptions   No medications on file    Modified Medications   No medications on file    Discontinued Medications   No medications on file    No orders of the defined types were placed in this encounter.  No follow-ups  on file. There are no Patient Instructions on file for this visit.  Recall:   Colorectal cancer screening: due for recall 2034.  Discussion and Summary:    Electronically Signed: Helene DELENA [place], PA 04/23/2024 12:52 PM

## 2024-04-15 ENCOUNTER — Encounter: Payer: Self-pay | Admitting: Internal Medicine

## 2024-04-15 ENCOUNTER — Other Ambulatory Visit: Payer: Self-pay

## 2024-04-15 DIAGNOSIS — R9389 Abnormal findings on diagnostic imaging of other specified body structures: Secondary | ICD-10-CM

## 2024-04-15 NOTE — Progress Notes (Signed)
 CT placed per CY

## 2024-04-15 NOTE — Telephone Encounter (Signed)
CT order has been placed

## 2024-04-23 ENCOUNTER — Ambulatory Visit (HOSPITAL_COMMUNITY)

## 2024-04-23 ENCOUNTER — Other Ambulatory Visit

## 2024-04-27 ENCOUNTER — Ambulatory Visit: Admitting: Internal Medicine

## 2024-04-27 DIAGNOSIS — J42 Unspecified chronic bronchitis: Secondary | ICD-10-CM

## 2024-04-27 LAB — PULMONARY FUNCTION TEST
DL/VA % pred: 115 %
DL/VA: 4.87 ml/min/mmHg/L
DLCO cor % pred: 96 %
DLCO cor: 24.36 ml/min/mmHg
DLCO unc % pred: 96 %
DLCO unc: 24.51 ml/min/mmHg
FEF 25-75 Post: 5.7 L/s
FEF 25-75 Pre: 3.97 L/s
FEF2575-%Change-Post: 43 %
FEF2575-%Pred-Post: 175 %
FEF2575-%Pred-Pre: 122 %
FEV1-%Change-Post: 10 %
FEV1-%Pred-Post: 101 %
FEV1-%Pred-Pre: 91 %
FEV1-Post: 3.45 L
FEV1-Pre: 3.13 L
FEV1FVC-%Change-Post: 4 %
FEV1FVC-%Pred-Pre: 105 %
FEV6-%Change-Post: 6 %
FEV6-%Pred-Post: 92 %
FEV6-%Pred-Pre: 86 %
FEV6-Post: 3.84 L
FEV6-Pre: 3.6 L
FEV6FVC-%Pred-Post: 102 %
FEV6FVC-%Pred-Pre: 102 %
FVC-%Change-Post: 5 %
FVC-%Pred-Post: 90 %
FVC-%Pred-Pre: 85 %
FVC-Post: 3.84 L
FVC-Pre: 3.64 L
Post FEV1/FVC ratio: 90 %
Post FEV6/FVC ratio: 100 %
Pre FEV1/FVC ratio: 86 %
Pre FEV6/FVC Ratio: 100 %
RV % pred: 68 %
RV: 1.32 L
TLC % pred: 84 %
TLC: 4.93 L

## 2024-04-27 NOTE — Progress Notes (Signed)
 Full pft performed today.

## 2024-04-27 NOTE — Patient Instructions (Signed)
 Full pft performed today.

## 2024-05-04 ENCOUNTER — Telehealth (HOSPITAL_BASED_OUTPATIENT_CLINIC_OR_DEPARTMENT_OTHER): Payer: Self-pay

## 2024-05-04 ENCOUNTER — Ambulatory Visit: Admitting: Allergy

## 2024-05-04 DIAGNOSIS — G478 Other sleep disorders: Secondary | ICD-10-CM

## 2024-05-04 DIAGNOSIS — R0683 Snoring: Secondary | ICD-10-CM

## 2024-05-04 DIAGNOSIS — G4719 Other hypersomnia: Secondary | ICD-10-CM

## 2024-05-04 NOTE — Telephone Encounter (Signed)
 Copied from CRM [id number]. Topic: Clinical - Lab/Test Results >> May 04, 2024 12:49 PM Rozanna G wrote: Reason for CRM: PT CALLING FOR HER RESULTS ON HER PFT THAT WAS LAST WEEK. PLEASE CALL AND GIVE HER SOME INFORMATION ON RESULTS. THANKS

## 2024-05-04 NOTE — Progress Notes (Deleted)
 New Patient Note  RE: Lydia Gross MRN: [id number] DOB: 12/22/1975 Date of Office Visit: 05/04/2024  Consult requested by: Neysa Reggy BIRCH, MD Primary care provider: Samie Frederick, PA-C  Chief Complaint: No chief complaint on file.  History of Present Illness: I had the pleasure of seeing Lydia Gross for initial evaluation at the [facility] on 05/04/2024. She is a 48 y.o. female, who is referred here by Samie Frederick, PA-C for the evaluation of sinus issues.  Discussed the use of AI scribe software for clinical note transcription with the patient, who gave verbal consent to proceed.  History of Present Illness             03/02/2024 pulmonology visit: Bronchitis Chronic bronchitis with recurrent pharyngitis and mucus production, exacerbated by tobacco use. ENT ruled out sinusitis. Symptoms improve with smoking cessation. - Order CT scan of the chest. - Order blood count with differential. - Schedule pulmonary function test. - Refer to allergist. - Advise smoking cessation.    Chest wall pain - nonspecific, present at least intermittently over past year and affected by direct pressure or use of L arm. -CT chest  Assessment and Plan: Lydia Gross is a 48 y.o. female with: ***  Assessment and Plan               No follow-ups on file.  No orders of the defined types were placed in this encounter.  Lab Orders  No laboratory test(s) ordered today    Other allergy screening: Asthma: {Blank single:19197::yes,no} Rhino conjunctivitis: {Blank single:19197::yes,no} Food allergy: {Blank single:19197::yes,no} Medication allergy: {Blank single:19197::yes,no} Hymenoptera allergy: {Blank single:19197::yes,no} Urticaria: {Blank single:19197::yes,no} Eczema:{Blank single:19197::yes,no} History of recurrent infections suggestive of immunodeficency: {Blank single:19197::yes,no}  Diagnostics: Spirometry:  Tracings  reviewed. Her effort: {Blank single:19197::Good reproducible efforts.,It was hard to get consistent efforts and there is a question as to whether this reflects a maximal maneuver.,Poor effort, data can not be interpreted.} FVC: ***L FEV1: ***L, ***% predicted FEV1/FVC ratio: ***% Interpretation: {Blank single:19197::Spirometry consistent with mild obstructive disease,Spirometry consistent with moderate obstructive disease,Spirometry consistent with severe obstructive disease,Spirometry consistent with possible restrictive disease,Spirometry consistent with mixed obstructive and restrictive disease,Spirometry uninterpretable due to technique,Spirometry consistent with normal pattern,No overt abnormalities noted given today's efforts}.  Please see scanned spirometry results for details.  Skin Testing: {Blank single:19197::Select foods,Environmental allergy panel,Environmental allergy panel and select foods,Food allergy panel,None,Deferred due to recent antihistamines use}. *** Results discussed with patient/family.   Past Medical History: Patient Active Problem List   Diagnosis Date Noted  . BMI 45.0-49.9, adult (HCC) 12/02/2023  . Excessive daytime sleepiness 02/13/2023  . Non-restorative sleep 02/13/2023  . Snoring 04/04/2022  . 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 Thyroid  nodule 01/26/2014  . Cervical pain 01/12/2014  . Lower leg edema 05/15/2011  . Avitaminosis D 05/02/2011  . Alopecia 05/01/2011  . Low back pain 12/01/2010  . Extreme obesity 12/01/2010  . Malaise and fatigue 12/01/2010  . Essential (primary) hypertension 12/01/2010  . Insomnia 09/20/2010  . Iron deficiency anemia secondary to inadequate dietary iron intake 09/20/2010  . Gastroesophageal reflux disease with esophagitis 07/21/2010  . Anemia 06/21/2010  . Adjustment disorder with depressed mood 06/15/2010  . Allergic rhinitis 04/16/2008   Past Medical History:  Diagnosis  Date  . Acid reflux   . Anxiety   . Chronic neck pain   . Gout   . Hypertension   . Obesity    Past Surgical History: Past Surgical History:  Procedure Laterality Date  .  CESAREAN SECTION    . CHOLECYSTECTOMY    . DENTAL RESTORATION/EXTRACTION WITH X-RAY  03/05/2023   L lower molar extraction  . TUBAL LIGATION     Medication List:  Current Outpatient Medications  Medication Sig Dispense Refill  . albuterol  (VENTOLIN  HFA) 108 (90 Base) MCG/ACT inhaler Inhale 2 puffs into the lungs every 6 (six) hours as needed.    . baclofen (LIORESAL) 10 MG tablet Take 10 mg by mouth 2 (two) times daily.    . hydrochlorothiazide (HYDRODIURIL) 25 MG tablet Take 25 mg by mouth every morning.     . ibuprofen  (ADVIL ) 600 MG tablet Take 1 tablet (600 mg total) by mouth every 6 (six) hours as needed for moderate pain (pain score 4-6) or cramping. 30 tablet 2  . LINZESS 145 MCG CAPS capsule Take 145 mcg by mouth daily.    . meloxicam  (MOBIC ) 15 MG tablet Take by mouth as needed.    SABRA olmesartan (BENICAR) 5 MG tablet Take 20 mg by mouth daily.    SABRA omeprazole (PRILOSEC) 20 MG capsule Take 20 mg by mouth daily.    . triamcinolone  (NASACORT ) 55 MCG/ACT AERO nasal inhaler Place 2 sprays into the nose daily. (Patient not taking: Reported on 04/02/2024) 10.8 mL 0   No current facility-administered medications for this visit.   Allergies: Allergies  Allergen Reactions  . Amoxicillin Hives   Social History: Social History   Socioeconomic History  . Marital status: Single    Spouse name: Not on file  . Number of children: 3  . Years of education: Not on file  . Highest education level: Not on file  Occupational History  . Not on file  Tobacco Use  . Smoking status: Former    Current packs/day: 0.00    Types: Cigarettes    Quit date: 02/13/2024    Years since quitting: 0.2    Passive exposure: Never  . Smokeless tobacco: Not on file  . Tobacco comments:    Stopped during the winter then  restarted.  Stopped around Feb 13, 2024.  Vaping Use  . Vaping status: Never Used  Substance and Sexual Activity  . Alcohol use: Not Currently    Comment: little  . Drug use: No  . Sexual activity: Yes    Birth control/protection: Surgical  Other Topics Concern  . Not on file  Social History Narrative   Are you right handed or left handed? Left handed    Are you currently employed ? No    What is your current occupation?   Do you live at home alone? No    Who lives with you? With Daughter    What type of home do you live in: 1 story or 2 story? Lives in a two story home.    Caffeine rarely    Social Drivers of Corporate investment banker Strain: Low Risk  (11/15/2023)   Received [facility]   Overall Financial Resource Strain (CARDIA)   . Difficulty of Paying Living Expenses: Not very hard  Food Insecurity: No Food Insecurity (11/15/2023)   [facility]   Hunger Vital Sign   . Within the past 12 months, you worried that your food would run out before you got the money to buy more.: Never true   . Within the past 12 months, the food you bought just didn't last and you didn't have money to get more.: Never true  Transportation Needs: No Transportation Needs (11/15/2023)  Received from [facility]   PRAPARE - Transportation   . Lack of Transportation (Medical): No   . Lack of Transportation (Non-Medical): No  Physical Activity: Not on file  Stress: No Stress Concern Present (08/19/2023)   Received from [facility] Occupational [facility] - Occupational Stress Questionnaire   . Feeling of Stress : Not at all  Social Connections: Unknown (02/11/2022)   Received from [facility]   Social Network   . Social Network: Not on file   Lives in a ***. Smoking: *** Occupation: ***  Environmental HistorySurveyor, minerals in the house: Network engineer in the family room: {Blank single:19197::yes,no} Carpet  in the bedroom: {Blank single:19197::yes,no} Heating: {Blank single:19197::electric,gas,heat pump} Cooling: {Blank single:19197::central,window,heat pump} Pet: {Blank single:19197::yes ***,no}  Family History: Family History  Problem Relation Age of Onset  . Hypertension Mother   . Other Daughter        IIH  . Thyroid  disease Neg Hx    Problem                               Relation Asthma                                   *** Eczema                                *** Food allergy                          *** Allergic rhino conjunctivitis     ***  Review of Systems  Constitutional:  Negative for appetite change, chills, fever and unexpected weight change.  HENT:  Negative for congestion and rhinorrhea.   Eyes:  Negative for itching.  Respiratory:  Negative for cough, chest tightness, shortness of breath and wheezing.   Cardiovascular:  Negative for chest pain.  Gastrointestinal:  Negative for abdominal pain.  Genitourinary:  Negative for difficulty urinating.  Skin:  Negative for rash.  Neurological:  Negative for headaches.    Objective: There were no vitals taken for this visit. There is no height or weight on file to calculate BMI. Physical Exam Vitals and nursing note reviewed.  Constitutional:      Appearance: Normal appearance. She is well-developed.  HENT:     Head: Normocephalic and atraumatic.     Right Ear: Tympanic membrane and external ear normal.     Left Ear: Tympanic membrane and external ear normal.     Nose: Nose normal.     Mouth/Throat:     Mouth: Mucous membranes are moist.     Pharynx: Oropharynx is clear.  Eyes:     Conjunctiva/sclera: Conjunctivae normal.  Cardiovascular:     Rate and Rhythm: Normal rate and regular rhythm.     Heart sounds: Normal heart sounds. No murmur heard.    No friction rub. No gallop.  Pulmonary:     Effort: Pulmonary effort is normal.     Breath sounds: Normal breath sounds. No wheezing, rhonchi  or rales.  Musculoskeletal:     Cervical back: Neck supple.  Skin:    General: Skin is warm.     Findings: No rash.  Neurological:     Mental Status: She is alert and  oriented to person, place, and time.  Psychiatric:        Behavior: Behavior normal.   The plan was reviewed with the patient/family, and all questions/concerned were addressed.  It was my pleasure to see Avynn today and participate in her care. Please feel free to contact me with any questions or concerns.  Sincerely,  Orlan Cramp, DO Allergy & Immunology  [facility]  [place] office: [phone] [facility] office: [phone]

## 2024-05-05 ENCOUNTER — Encounter: Payer: Self-pay | Admitting: Endocrinology

## 2024-05-06 NOTE — Telephone Encounter (Signed)
 Pulmonary Function Test showed normal lung function.  Is she ready for us  to go a head and order the home sleep test? She has a history of snoring and her other doctor wanted this done.

## 2024-05-07 NOTE — Telephone Encounter (Signed)
 I called and spoke to pt. Pt informed of Dr Saundra note and states she is ready to complete the testing. Pt also states she no longer wishes to see Dr Neda moving forward.

## 2024-05-07 NOTE — Telephone Encounter (Signed)
 Please order home sleep test- dx OSA  She can call us  2 weeks after the test for results and recommendations

## 2024-05-12 NOTE — Telephone Encounter (Signed)
 I called and spoke to pt. Pt informed of Dr Saundra message and verbalized understanding. HST ordered. NFN

## 2024-05-21 ENCOUNTER — Ambulatory Visit: Admitting: Allergy & Immunology

## 2024-06-16 ENCOUNTER — Encounter

## 2024-07-03 ENCOUNTER — Ambulatory Visit: Admitting: Internal Medicine

## 2024-07-28 ENCOUNTER — Ambulatory Visit

## 2024-07-28 DIAGNOSIS — G4719 Other hypersomnia: Secondary | ICD-10-CM

## 2024-07-28 DIAGNOSIS — G478 Other sleep disorders: Secondary | ICD-10-CM

## 2024-07-28 DIAGNOSIS — R0683 Snoring: Secondary | ICD-10-CM

## 2024-07-29 ENCOUNTER — Telehealth: Payer: Self-pay

## 2024-07-29 NOTE — Telephone Encounter (Signed)
 Copied from CRM #[id number]. Topic: Clinical - Medical Advice >> Jul 29, 2024  8:17 AM [name] wrote: Reason for CRM: Patient states she forgot to do the sleep study test last night, patient asked if se can do the sleep test tonight and bring it the office tomorrow? Please advise and call back [phone].   Dr Neysa, is this appropriate?

## 2024-07-29 NOTE — Telephone Encounter (Signed)
 Fine with me, but please pass this by the PCCs since they do the scheduling.

## 2024-08-04 NOTE — Telephone Encounter (Signed)
 That is no problem for us . Patient has completed sleep study and has done 5:32:00hrs. Nothing further needed.

## 2024-08-10 NOTE — Progress Notes (Unsigned)
 HPI F Smoker( says she has quit again) followed for Snoring, excessive somnolence, Abnormal Chest Ct,  Complicated by Obesity, GERD, HTN, Thyroid  Nodule, Tobacco Use,? Thymus enlarged on CT?,  PFT 04/27/24- Normal Pulmonary Function HST 07/29/24- AHI 6.7/hr, desat to 86%, body weight 331 lbs ==========================================================================================================  04/02/24- 47 yoF Smoker( says she has quit again) followed for Snoring, excessive somnolence, Abnormal Chest Ct,  Complicated by Obesity, GERD, HTN, Thyroid  Nodule, Tobacco Use Body weight today- ED 5/15 for 1 week cough, rhinitis> Zpak, tessalon , flonase .          She wanted earlier attention to abnl CT She has had ENT and GI evaluations- unrevealing for complaints of throat pain and mucus in throat. Neurology referred back for snoring. CT chest 03/19/24-  IMPRESSION: 1. No acute noncontrast chest CT findings. 2. Nonspecific interval increased hazy thymic tissue in the substernal space. Correlate clinically for thymic rebound or hyperplasia. This does not have a masslike appearance, but early presentation of thymic neoplasm could appear similar. Follow-up CT 6-12 months is suggested. 3. Stable shotty subcentimeter mediastinal nodes. No enlarged lymph nodes identified without contrast.  Discussed the use of AI scribe software for clinical note transcription with the patient, who gave verbal consent to proceed.  History of Present Illness   Lydia Gross is a 48 year old female with hiatal hernia and acid reflux who presents with chest pain and difficulty swallowing.  She experiences recurrent chest pains left upper anterior of her chest with a burning sensation. Known bad GERD. She had been told to take omeprazole twice daily, but has been using just once daily. Cardiac eval at recent ED visit- benign.  A recent CT scan showed haziness behind her breastbone, but no specific findings were  identified. She has difficulty swallowing large pills, which seem to get stuck in the upper part of her sternum, accompanied by a burning sensation linked to her acid reflux. A barium swallow test conducted a couple of months ago showed no abnormalities. She is currently taking omeprazole for acid reflux. Her swallowing difficulties worsened after resuming smoking earlier this year but have improved since she stopped smoking. No current smoking.     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   Gastroesophageal reflux disease (GERD) Chronic GERD with pyrosis and dysphagia, improved post-smoking cessation. Current omeprazole management unsatisfactory. -take omeprazole twice daily before meals -Discuss with PCP  Hiatal hernia Hiatal hernia with persistent symptoms despite previous GI evaluation. Symptoms improved post-smoking cessation.     She feels there is a lot going on  and wanted some clarification about the ?thymus disease before turning attention to getting home sleep test done. If PET  is not helpful, consider getting thoracic surgical evaluation of thymus-area shadow.    08/11/24- 48 yoF Smoker( says she has quit again) followed for Snoring, excessive somnolence, Abnormal Chest Ct,  Complicated by Obesity, GERD, HTN, Thyroid  Nodule, Tobacco Use Body weight today- ED 5/15 for 1 week cough, rhinitis> Zpak, tessalon , flonase .          She wanted earlier attention to abnl CT She has had ENT and GI evaluations- unrevealing for complaints of throat pain and mucus in throat. Neurology referred back for snoring. 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 Insurance  wouldn't agree to PET scan. No chest pain. She will call for result of CT. Assessment & Plan    ROS-see HPI   + = positive Constitutional:    weight loss, night sweats, fevers, chills, fatigue, lassitude. HEENT:    headaches, difficulty swallowing, tooth/dental problems, sore throat,       sneezing, itching, ear ache, nasal congestion, post nasal drip, snoring CV:    chest pain, orthopnea, PND, swelling in lower extremities, anasarca,                                   dizziness, palpitations Resp:   shortness of breath with exertion or at rest.                productive cough,   non-productive cough, coughing up of blood.              change in color of mucus.  wheezing.   Skin:    rash or lesions. GI:  No-   heartburn, indigestion, abdominal pain, nausea, vomiting, diarrhea,                 change in bowel habits, loss of appetite GU: dysuria, change in color of urine, no urgency or frequency.   flank pain. MS:   joint pain, stiffness, decreased range of motion, back pain. Neuro-     nothing unusual Psych:  change in mood or affect.  depression or anxiety.   memory loss.  OBJ- Physical Exam General- Alert, Oriented, Affect-flat/ depressed, Distress- none acute, +morbidly obese Skin- rash-none, lesions- none, excoriation- none Lymphadenopathy- none Head- atraumatic            Eyes- Gross vision intact, PERRLA, conjunctivae and secretions clear            Ears- Hearing, canals-normal            Nose- Clear, no-Septal dev, mucus, polyps, erosion, perforation             Throat- Mallampati III , mucosa clear , drainage- none, tonsils- atrophic Neck- flexible , trachea midline, no stridor , thyroid  nl, carotid no bruit Chest - symmetrical excursion , unlabored           Heart/CV- RRR , no murmur , no gallop  , no rub, nl s1 s2                           - JVD- none , edema- none, stasis changes- none, varices- none           Lung- clear to P&A, wheeze- none, cough- none , dullness-none,  rub- none           Chest wall-  Abd-  Br/ Gen/ Rectal- Not done, not indicated Extrem- cyanosis- none, clubbing, none, atrophy- none, strength- nl Neuro- grossly intact to observation

## 2024-08-10 NOTE — Telephone Encounter (Signed)
 HST was already submitted. NFN

## 2024-08-11 ENCOUNTER — Ambulatory Visit: Admitting: Internal Medicine

## 2024-08-11 ENCOUNTER — Encounter: Payer: Self-pay | Admitting: Internal Medicine

## 2024-08-11 VITALS — BP 150/91 | HR 68 | Temp 97.8°F | Ht 70.0 in | Wt 360.6 lb

## 2024-08-11 DIAGNOSIS — R918 Other nonspecific abnormal finding of lung field: Secondary | ICD-10-CM

## 2024-08-11 DIAGNOSIS — F17211 Nicotine dependence, cigarettes, in remission: Secondary | ICD-10-CM | POA: Diagnosis not present

## 2024-08-11 DIAGNOSIS — J42 Unspecified chronic bronchitis: Secondary | ICD-10-CM

## 2024-08-11 DIAGNOSIS — R9389 Abnormal findings on diagnostic imaging of other specified body structures: Secondary | ICD-10-CM

## 2024-08-11 DIAGNOSIS — G4733 Obstructive sleep apnea (adult) (pediatric): Secondary | ICD-10-CM

## 2024-08-11 DIAGNOSIS — G4719 Other hypersomnia: Secondary | ICD-10-CM

## 2024-08-11 NOTE — Patient Instructions (Signed)
 Ok to wait a year and revisit the mild sleep apnea. You can decide then whether to update the sleep study and whether to try a treatment, like CPAP or a fitted mouthpiece. Remember it will help to work on losing some weight.  Please call a couple of days after your CT scan next month, so we can see if we need to do anything else.

## 2024-08-14 ENCOUNTER — Encounter: Payer: Self-pay | Admitting: Internal Medicine

## 2024-08-14 DIAGNOSIS — R9389 Abnormal findings on diagnostic imaging of other specified body structures: Secondary | ICD-10-CM | POA: Insufficient documentation

## 2024-08-14 DIAGNOSIS — G4733 Obstructive sleep apnea (adult) (pediatric): Secondary | ICD-10-CM | POA: Insufficient documentation

## 2024-08-14 NOTE — Assessment & Plan Note (Signed)
 She WTW on therapy for mild OSA, possibly repat HST in 1 year.  Plan- work on weight. Sleep off back

## 2024-08-14 NOTE — Assessment & Plan Note (Signed)
 Possible thymus Plan- pending f/u CT

## 2024-08-25 ENCOUNTER — Ambulatory Visit
Admission: RE | Admit: 2024-08-25 | Discharge: 2024-08-25 | Disposition: A | Discharge status: Home / Self Care | Source: Ambulatory Visit | Attending: Internal Medicine | Admitting: Internal Medicine

## 2024-08-25 DIAGNOSIS — R9389 Abnormal findings on diagnostic imaging of other specified body structures: Secondary | ICD-10-CM

## 2024-08-25 MED ORDER — IOPAMIDOL (ISOVUE-300) INJECTION 61%
75.0000 mL | Freq: Once | INTRAVENOUS | Status: AC | PRN
  Administered 2024-08-25: 75 mL via INTRAVENOUS

## 2024-08-27 ENCOUNTER — Ambulatory Visit: Payer: Self-pay | Admitting: Internal Medicine

## 2024-09-01 NOTE — Progress Notes (Signed)
 Called and spoke to pt- advised of CR results per Dr. Neysa. Pt verbalized understanding, NFN.

## 2024-09-02 ENCOUNTER — Ambulatory Visit

## 2024-09-06 NOTE — Progress Notes (Signed)
 HPI F Smoker( says she has quit again) followed for Snoring, excessive somnolence, Abnormal Chest Ct,  Complicated by Obesity, GERD, HTN, Thyroid  Nodule, Tobacco Use,? Thymus enlarged on CT?,  PFT 04/27/24- Normal Pulmonary Function HST 07/29/24- AHI 6.7/hr, desat to 86%, body weight 331 lbs ==========================================================================================================  04/02/24- 47 yoF Smoker( says she has quit again) followed for Snoring, excessive somnolence, Abnormal Chest Ct,  Complicated by Obesity, GERD, HTN, Thyroid  Nodule, Tobacco Use Body weight today- ED 5/15 for 1 week cough, rhinitis> Zpak, tessalon , flonase .          She wanted earlier attention to abnl CT She has had ENT and GI evaluations- unrevealing for complaints of throat pain and mucus in throat. Neurology referred back for snoring. CT chest 03/19/24-  IMPRESSION: 1. No acute noncontrast chest CT findings. 2. Nonspecific interval increased hazy thymic tissue in the substernal space. Correlate clinically for thymic rebound or hyperplasia. This does not have a masslike appearance, but early presentation of thymic neoplasm could appear similar. Follow-up CT 6-12 months is suggested. 3. Stable shotty subcentimeter mediastinal nodes. No enlarged lymph nodes identified without contrast.  Discussed the use of AI scribe software for clinical note transcription with the patient, who gave verbal consent to proceed.  History of Present Illness   Lydia Gross is a 48 year old female with hiatal hernia and acid reflux who presents with chest pain and difficulty swallowing.  She experiences recurrent chest pains left upper anterior of her chest with a burning sensation. Known bad GERD. She had been told to take omeprazole twice daily, but has been using just once daily. Cardiac eval at recent ED visit- benign.  A recent CT scan showed haziness behind her breastbone, but no specific findings were  identified. She has difficulty swallowing large pills, which seem to get stuck in the upper part of her sternum, accompanied by a burning sensation linked to her acid reflux. A barium swallow test conducted a couple of months ago showed no abnormalities. She is currently taking omeprazole for acid reflux. Her swallowing difficulties worsened after resuming smoking earlier this year but have improved since she stopped smoking. No current smoking.     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   Gastroesophageal reflux disease (GERD) Chronic GERD with pyrosis and dysphagia, improved post-smoking cessation. Current omeprazole management unsatisfactory. -take omeprazole twice daily before meals -Discuss with PCP  Hiatal hernia Hiatal hernia with persistent symptoms despite previous GI evaluation. Symptoms improved post-smoking cessation.     She feels there is a lot going on  and wanted some clarification about the ?thymus disease before turning attention to getting home sleep test done. If PET  is not helpful, consider getting thoracic surgical evaluation of thymus-area shadow.    08/11/24- 48 yoF Smoker( says she has quit again) followed for Snoring, excessive somnolence, Abnormal Chest Ct,  Complicated by Obesity, GERD, HTN, Thyroid  Nodule, Tobacco Use Body weight today- ED 5/15 for 1 week cough, rhinitis> Zpak, tessalon , flonase .          She wanted earlier attention to abnl CT She has had ENT and GI evaluations- unrevealing for complaints of throat pain and mucus in throat. Neurology referred back for snoring. 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 Insurance  wouldn't agree to PET scan. No chest pain. She will call for result of CT.  09/08/24- 48 yoF Smoker( says she has quit again) followed for Snoring/ Minimal OSA, excessive somnolence, Abnormal Chest Ct,  Complicated by Obesity, GERD, HTN, Thyroid  Nodule, hx Tobacco Use Body weight today-365  Aware absence of tumor- fatty CT chest 08/25/24 IMPRESSION: 1. No acute pulmonary findings. 2. Stable 3 mm right lower lobe pulmonary nodule. No routine follow-up imaging is recommended per Fleischner Society Guidelines. 3. 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  Assessment and Plan:    Abnormal chest CT Benign residual thymic tissue in chest CT scan shows benign residual thymic tissue, likely fatty, with no mass. - Re-evaluate with CT scan in one year.  Stable right lower lung nodule (likely scar) 3 mm stable nodule in right lower lung, likely scar from past inflammation or infection. - Monitor as per standard criteria for stable nodules.  Intermittently swollen cervical lymph node Intermittent cervical lymph node swelling, likely benign due to local inflammation, resolves spontaneously. She has previously seen ENT and had CT for this. - Monitor for changes in size or persistence. - Consider ENT referral if symptoms persist or worsen.   Obstructive Sleep Apnea- minimal -Try to lower weight and sleep off back     ROS-see HPI   + = positive Constitutional:    weight loss, night sweats, fevers, chills, fatigue, lassitude. HEENT:    headaches, difficulty swallowing, tooth/dental problems, sore throat,       sneezing, itching, ear ache, nasal congestion, post nasal drip, snoring CV:    chest pain, orthopnea, PND, swelling in lower extremities, anasarca,                                   dizziness, palpitations Resp:   shortness of breath with exertion or at rest.                productive cough,   non-productive cough,  coughing up of blood.              change in color of mucus.  wheezing.   Skin:    rash or lesions. GI:  No-   heartburn, indigestion, abdominal pain, nausea, vomiting, diarrhea,                 change in bowel habits, loss of appetite GU: dysuria, change in color of urine, no urgency or frequency.   flank pain. MS:   joint pain, stiffness, decreased range of motion, back pain. Neuro-     nothing unusual Psych:  change in mood or affect.  depression or anxiety.   memory loss.  OBJ- Physical Exam General- Alert, Oriented, Affect-flat/ depressed, Distress- none acute, +morbidly obese Skin- rash-none, lesions- none, excoriation- none Lymphadenopathy- none Head- atraumatic            Eyes- Gross vision intact, PERRLA, conjunctivae and secretions clear            Ears- Hearing, canals-normal            Nose- Clear, no-Septal dev, mucus, polyps, erosion, perforation             Throat- Mallampati III , mucosa clear , drainage- none, tonsils- atrophic Neck- flexible , trachea midline, no stridor , thyroid  nl, carotid no  bruit Chest - symmetrical excursion , unlabored           Heart/CV- RRR , no murmur , no gallop  , no rub, nl s1 s2                           - JVD- none , edema- none, stasis changes- none, varices- none           Lung- clear to P&A, wheeze- none, cough- none , dullness-none, rub- none           Chest wall-  Abd-  Br/ Gen/ Rectal- Not done, not indicated Extrem- cyanosis- none, clubbing, none, atrophy- none, strength- nl Neuro- grossly intact to observation

## 2024-09-08 ENCOUNTER — Encounter: Payer: Self-pay | Admitting: Internal Medicine

## 2024-09-08 ENCOUNTER — Ambulatory Visit: Admitting: Internal Medicine

## 2024-09-08 VITALS — BP 110/68 | HR 79 | Temp 98.0°F | Ht 70.0 in | Wt 365.0 lb

## 2024-09-08 DIAGNOSIS — R918 Other nonspecific abnormal finding of lung field: Secondary | ICD-10-CM | POA: Diagnosis not present

## 2024-09-08 DIAGNOSIS — F17211 Nicotine dependence, cigarettes, in remission: Secondary | ICD-10-CM

## 2024-09-08 DIAGNOSIS — R911 Solitary pulmonary nodule: Secondary | ICD-10-CM

## 2024-09-08 DIAGNOSIS — R0789 Other chest pain: Secondary | ICD-10-CM | POA: Diagnosis not present

## 2024-09-08 DIAGNOSIS — R9389 Abnormal findings on diagnostic imaging of other specified body structures: Secondary | ICD-10-CM

## 2024-09-08 DIAGNOSIS — K219 Gastro-esophageal reflux disease without esophagitis: Secondary | ICD-10-CM | POA: Diagnosis not present

## 2024-09-08 DIAGNOSIS — G4733 Obstructive sleep apnea (adult) (pediatric): Secondary | ICD-10-CM | POA: Diagnosis not present

## 2024-09-08 DIAGNOSIS — R590 Localized enlarged lymph nodes: Secondary | ICD-10-CM

## 2024-09-08 NOTE — Patient Instructions (Signed)
 At checkout, ask to be brought back with a sleep doctor. That doctor can look again as needed at your very mild sleep apnea. You can also discuss the area of fatty shadow at your thymus gland, and the lymph node at the left side of your neck that seems to swell and shrink from time to time.  Please call if we can help.

## 2024-09-09 NOTE — Progress Notes (Signed)
 Scanned document reviewed

## 2024-09-16 ENCOUNTER — Emergency Department (HOSPITAL_BASED_OUTPATIENT_CLINIC_OR_DEPARTMENT_OTHER)

## 2024-09-16 ENCOUNTER — Encounter (HOSPITAL_BASED_OUTPATIENT_CLINIC_OR_DEPARTMENT_OTHER): Payer: Self-pay

## 2024-09-16 ENCOUNTER — Emergency Department (HOSPITAL_BASED_OUTPATIENT_CLINIC_OR_DEPARTMENT_OTHER)
Admission: EM | Admit: 2024-09-16 | Discharge: 2024-09-16 | Disposition: A | Discharge status: Home / Self Care | Attending: Emergency Medicine | Admitting: Emergency Medicine

## 2024-09-16 ENCOUNTER — Other Ambulatory Visit: Payer: Self-pay

## 2024-09-16 DIAGNOSIS — N390 Urinary tract infection, site not specified: Secondary | ICD-10-CM

## 2024-09-16 LAB — URINALYSIS, ROUTINE W REFLEX MICROSCOPIC
Bilirubin Urine: NEGATIVE
Glucose, UA: NEGATIVE mg/dL
Ketones, ur: NEGATIVE mg/dL
Nitrite: NEGATIVE
Protein, ur: NEGATIVE mg/dL
Specific Gravity, Urine: 1.014 (ref 1.005–1.030)
pH: 6.5 (ref 5.0–8.0)

## 2024-09-16 LAB — COMPREHENSIVE METABOLIC PANEL WITH GFR
ALT: 19 U/L (ref 0–44)
AST: 18 U/L (ref 15–41)
Albumin: 4.1 g/dL (ref 3.5–5.0)
Alkaline Phosphatase: 48 U/L (ref 38–126)
Anion gap: 11 (ref 5–15)
BUN: 13 mg/dL (ref 6–20)
CO2: 26 mmol/L (ref 22–32)
Calcium: 9.5 mg/dL (ref 8.9–10.3)
Chloride: 102 mmol/L (ref 98–111)
Creatinine, Ser: 0.79 mg/dL (ref 0.44–1.00)
GFR, Estimated: >60 mL/min (ref >60)
Glucose, Bld: 98 mg/dL (ref 70–99)
Potassium: 3.5 mmol/L (ref 3.5–5.1)
Sodium: 139 mmol/L (ref 135–145)
Total Bilirubin: 1.1 mg/dL (ref 0.0–1.2)
Total Protein: 7.1 g/dL (ref 6.5–8.1)

## 2024-09-16 LAB — PREGNANCY, URINE: Preg Test, Ur: NEGATIVE

## 2024-09-16 LAB — LIPASE, BLOOD: Lipase: 22 U/L (ref 11–51)

## 2024-09-16 LAB — CBC
HCT: 42 % (ref 36.0–46.0)
Hemoglobin: 14.5 g/dL (ref 12.0–15.0)
MCH: 29.6 pg (ref 26.0–34.0)
MCHC: 34.5 g/dL (ref 30.0–36.0)
MCV: 85.7 fL (ref 80.0–100.0)
Platelets: 255 K/uL (ref 150–400)
RBC: 4.9 MIL/uL (ref 3.87–5.11)
RDW: 12.1 % (ref 11.5–15.5)
WBC: 6.5 K/uL (ref 4.0–10.5)
nRBC: 0 % (ref 0.0–0.2)

## 2024-09-16 MED ORDER — KETOROLAC TROMETHAMINE 15 MG/ML IJ SOLN
15.0000 mg | Freq: Once | INTRAMUSCULAR | Status: AC
  Administered 2024-09-16: 15 mg via INTRAVENOUS
  Filled 2024-09-16: qty 1

## 2024-09-16 NOTE — ED Notes (Signed)
Called lab to run urine culture.

## 2024-09-16 NOTE — ED Notes (Signed)
 Pt d/c instructions, medications, and follow-up care reviewed with pt. Pt verbalized understanding and had no further questions at time of d/c. Pt CA&Ox4, ambulatory, and in NAD at time of d/c

## 2024-09-16 NOTE — Discharge Instructions (Signed)
 You were seen in the emergency department today for concerns of abdominal discomfort and urinary discomfort.  You do appear to still have an ongoing UTI but given that you are still taking antibiotics, urine biotics we have not fully cleared up this infection at this time.  Continue taking your antibiotics until completed.  You have a urine culture that is currently in process and will be available in the next several days.  I would recommend reaching out and following up closely with your urologist and your nephrologist for further evaluation.  For any concerns or worsening symptoms, return to the emergency department.

## 2024-09-16 NOTE — ED Provider Notes (Signed)
 [place] EMERGENCY DEPARTMENT AT [facility] Provider Note   CSN: [id number] Arrival date & time: 09/16/24  1208     Patient presents with: Abdominal Pain and Dysuria   Lydia Gross is a 48 y.o. female.  Patient with past history significant for hypertension, obesity, anemia, malaise and fatigue presents to the emergency department with concerns of abdominal discomfort and dysuria.  Reports having ongoing issues with urinary symptoms for the last several weeks has been on 2 rounds of antibiotics including Keflex and ciprofloxacin.  States that she has been seen by her nephrologist and had been advised that she would benefit from a CT scan.  She does note that she had some hard mass that she believes that she urinated out several days ago.  She is unsure if she passed the kidney stone.  Dors is some slight pain to her left flank and left upper quadrant.  Denies any nausea, vomiting, or fever.   Abdominal Pain Associated symptoms: dysuria   Dysuria Associated symptoms: abdominal pain        Prior to Admission medications   Medication Sig Start Date End Date Taking? Authorizing Provider  albuterol  (VENTOLIN  HFA) 108 (90 Base) MCG/ACT inhaler Inhale 2 puffs into the lungs every 6 (six) hours as needed. 02/09/19   [provider]  baclofen (LIORESAL) 10 MG tablet Take 10 mg by mouth 2 (two) times daily.    [provider]  famotidine  (PEPCID ) 40 MG tablet Take 40 mg by mouth daily.    [provider]  hydrochlorothiazide (HYDRODIURIL) 25 MG tablet Take 25 mg by mouth every morning.     [provider]  ibuprofen  (ADVIL ) 600 MG tablet Take 1 tablet (600 mg total) by mouth every 6 (six) hours as needed for moderate pain (pain score 4-6) or cramping. 12/02/23   Zina Jerilynn LABOR, MD  LINZESS 145 MCG CAPS capsule Take 145 mcg by mouth daily.    [provider]  meloxicam  (MOBIC ) 15 MG tablet Take by mouth as needed. 06/05/23   [provider]  olmesartan (BENICAR) 5 MG tablet Take 20 mg by mouth daily. 02/26/23   [provider]  omeprazole (PRILOSEC) 20 MG capsule Take 20 mg by mouth daily.    [provider]  triamcinolone  (NASACORT ) 55 MCG/ACT AERO nasal inhaler Place 2 sprays into the nose daily. 02/27/24   Silver Wonda LABOR, PA    Allergies: Amoxicillin    Review of Systems  Gastrointestinal:  Positive for abdominal pain.  Genitourinary:  Positive for dysuria.  All other systems reviewed and are negative.   Updated Vital Signs BP 99/63   Pulse 85   Temp (!) 97.5 F (36.4 C) (Oral)   Resp 20   LMP 08/24/2024 (Approximate)   SpO2 100%   Physical Exam Vitals and nursing note reviewed.  Constitutional:      General: She is not in acute distress.    Appearance: She is well-developed.  HENT:     Head: Normocephalic and atraumatic.  Eyes:     Conjunctiva/sclera: Conjunctivae normal.  Cardiovascular:     Rate and Rhythm: Normal rate and regular rhythm.     Heart sounds: No murmur heard. Pulmonary:     Effort: Pulmonary effort is normal. No respiratory distress.     Breath sounds: Normal breath sounds.  Abdominal:     Palpations: Abdomen is soft.     Tenderness: There is abdominal tenderness in the left upper quadrant. There is left  CVA tenderness. There is no guarding.  Musculoskeletal:        General: No swelling.     Cervical back: Neck supple.  Skin:    General: Skin is warm and dry.     Capillary Refill: Capillary refill takes less than 2 seconds.  Neurological:     Mental Status: She is alert.  Psychiatric:        Mood and Affect: Mood normal.     (all labs ordered are listed, but only abnormal results are displayed) Labs Reviewed  URINALYSIS, ROUTINE W REFLEX MICROSCOPIC - Abnormal; Notable for the following components:      Result Value   APPearance HAZY (*)    Hgb urine dipstick TRACE (*)    Leukocytes,Ua LARGE (*)    Bacteria, UA RARE (*)    All other  components within normal limits  URINE CULTURE  PREGNANCY, URINE  LIPASE, BLOOD  COMPREHENSIVE METABOLIC PANEL WITH GFR  CBC    EKG: None  Radiology: CT Renal Stone Study Result Date: 09/16/2024 EXAM: CT UROGRAM 09/16/2024 01:49:51 PM TECHNIQUE: CT of the abdomen and pelvis was performed without the administration of intravenous contrast as per CT urogram protocol. Multiplanar reformatted images as well as MIP urogram images are provided for review. Automated exposure control, iterative reconstruction, and/or weight based adjustment of the mA/kV was utilized to reduce the radiation dose to as low as reasonably achievable. COMPARISON: CT 01/25/2024 CLINICAL HISTORY: Left flank pain as well as UTI several weeks ago. Possible renal stone. FINDINGS: LOWER CHEST: No acute abnormality. LIVER: The liver is unremarkable. GALLBLADDER AND BILE DUCTS: Previous cholecystectomy. No biliary ductal dilatation. SPLEEN: No acute abnormality. PANCREAS: No acute abnormality. ADRENAL GLANDS: No acute abnormality. KIDNEYS, URETERS AND BLADDER: Kidneys are normal in size without hydronephrosis or nephrolithiasis. Ureters and bladder are normal. No perinephric or periureteral stranding. GI AND BOWEL: Stomach demonstrates no acute abnormality. Mild diverticulosis of the colon. Appendix is normal. There is no bowel obstruction. PERITONEUM AND RETROPERITONEUM: No ascites. No free air. VASCULATURE: Aorta is normal in caliber. LYMPH NODES: No lymphadenopathy. REPRODUCTIVE ORGANS: Uterus and adnexal regions are normal. BONES AND SOFT TISSUES: No acute osseous abnormality. No focal soft tissue abnormality. IMPRESSION: 1. No acute findings in the abdomen and pelvis. No renal or ureteral stones or obstruction. 2. Mild diverticulosis of the colon without evidence of diverticulitis. Electronically signed by: Toribio Agreste MD 09/16/2024 02:32 PM EST RP Workstation: [id number]     Procedures   Medications Ordered in the ED   ketorolac  (TORADOL ) 15 MG/ML injection 15 mg (15 mg Intravenous Given 09/16/24 1308)                                    Medical Decision Making Amount and/or Complexity of Data Reviewed Labs: ordered. Radiology: ordered.  Risk Prescription drug management.   This patient presents to the ED for concern of abdominal pain, dysuria, this involves an extensive number of treatment options, and is a complaint that carries with it a high risk of complications and morbidity.  The differential diagnosis includes UTI, pyelonephritis, urolithiasis, interstitial cystitis, diverticulitis   Co morbidities that complicate the patient evaluation  Hypertension, obesity, anemia, malaise and fatigue   Additional history obtained:  Additional history obtained from chart review   Lab Tests:  I Ordered, and personally interpreted labs.  The pertinent results include: CBC unremarkable, urine pregnancy negative, UA with questionable infection with  rare bacteria seen as well as leukocytes and trace blood with a hazy appearance.  Sample was not 100% sterile as are 6-10 squamous epithelial cells present.  There is some mucus and hyaline cast present in the urine as well.  CMP unremarkable, lipase normal at 22, urine culture pending.   Imaging Studies ordered:  I ordered imaging studies including CT renal stone study I independently visualized and interpreted imaging which showed no acute findings in the abdomen and pelvis. No renal or ureteral stones or obstruction. 2. Mild diverticulosis of the colon without evidence of diverticulitis. I agree with the radiologist interpretation   Consultations Obtained:  I requested consultation with none,  and discussed lab and imaging findings as well as pertinent plan - they recommend: N/A   Problem List / ED Course / Critical interventions / Medication management  Patient with past history significant for obesity, hypertension, anemia, malaise and fatigue  presents the emergency department with concerns of abdominal pain and dysuria.  States that she has been treated for a UTI with 2 different antibiotics in the last several weeks.  Was on Keflex and ciprofloxacin and states that she had some improvement in her urinary discomfort but continues to have cloudy and malodorous urine.  No reported kidney disease. Physical exam reveals some tenderness to palpation in the left upper quadrant.  There is also some left flank tenderness although not severe.  Otherwise well-appearing adult. Given her symptoms have been protracted, will obtain labs and likely CT imaging to assess for any possible kidney stone versus pyelonephritis versus other.  Doubtful pyelonephritis due to lack of fever and minimal CVA tenderness. Workup shows reassuring labs but urine appears to be likely contaminated with bacteria, leukocytes, also notably showing mucus and hyaline cast.  Patient denies any history of kidney disease but does report that she has been following with Dr. Dolan, [place] [facility]. CT imaging is negative for any acute findings.  No signs of a kidney stone, obstruction, and no other abdominal abnormalities to explain current symptoms. Based on her symptoms and urine findings, I am concerned she likely still is an ongoing UTI although she is currently still taking Keflex.  Advised that she continue these antibiotics while urine culture is processing.  I am unsure exactly what is causing the symptoms beyond what appears to be a UTI but unclear if she has either not responded well to prior antibiotics, compliance issues, or possible resistance.  No recent urine culture that I can see on my end.  Urine culture will likely guide treatment as needed.  Advised patient to follow-up closely with her urologist and nephrologist given her persistent symptoms.  Return precautions discussed.  Discharged home in stable condition. I ordered medication including Toradol  for  pain Reevaluation of the patient after these medicines showed that the patient improved I have reviewed the patients home medicines and have made adjustments as needed   Social Determinants of Health:  None   Test / Admission - Considered:  Considered but stable for outpatient follow-up.  Final diagnoses:  Urinary tract infection without hematuria, site unspecified    ED Discharge Orders     None          Lydia Gross LABOR, PA-C 09/16/24 1555    Lydia Jerilynn RAMAN, MD 09/22/24 1315

## 2024-09-16 NOTE — ED Triage Notes (Signed)
 Pt reports dx w/ UTI several weeks ago, completed one round of abx but sx persist. C/o cloudy and odorous urine, L flank pain, 'mucous in urine' per pt. Reports currently on another round of abx prescribed by her kidney doctor. Pt reports she is concerned she may have passed a kidney stone last week also.

## 2024-09-17 ENCOUNTER — Other Ambulatory Visit (HOSPITAL_COMMUNITY)
Admission: RE | Admit: 2024-09-17 | Discharge: 2024-09-17 | Disposition: A | Discharge status: Home / Self Care | Source: Ambulatory Visit | Attending: Obstetrics and Gynecology | Admitting: Obstetrics and Gynecology

## 2024-09-17 ENCOUNTER — Encounter: Payer: Self-pay | Admitting: Obstetrics and Gynecology

## 2024-09-17 ENCOUNTER — Ambulatory Visit: Admitting: Obstetrics and Gynecology

## 2024-09-17 VITALS — BP 120/82 | HR 77 | Ht 70.0 in | Wt 361.6 lb

## 2024-09-17 DIAGNOSIS — N898 Other specified noninflammatory disorders of vagina: Secondary | ICD-10-CM

## 2024-09-17 DIAGNOSIS — Z01419 Encounter for gynecological examination (general) (routine) without abnormal findings: Secondary | ICD-10-CM

## 2024-09-17 DIAGNOSIS — Z113 Encounter for screening for infections with a predominantly sexual mode of transmission: Secondary | ICD-10-CM | POA: Diagnosis present

## 2024-09-17 DIAGNOSIS — Z124 Encounter for screening for malignant neoplasm of cervix: Secondary | ICD-10-CM

## 2024-09-17 LAB — URINE CULTURE
Culture: NO GROWTH
Special Requests: NORMAL

## 2024-09-17 NOTE — Progress Notes (Signed)
 Pt presents for AEX.  PAP and colpo last year  Requesting PAP and STD testing  Mammogram 05/2024 Colonoscopy 2024 C/o UTI symptoms, seen in ED yesterday

## 2024-09-17 NOTE — Progress Notes (Unsigned)
 WELL-WOMAN PHYSICAL & PAP Patient name: Lydia Gross MRN [id number]  Date of birth: [birth year] Chief Complaint:   No chief complaint on file.  History of Present Illness:   Lydia Gross is a 48 y.o. [phone] {Race/ethnicity:17218} female being seen today for a routine well-woman exam.  Current complaints: ***  PCP: ***      {DOES_DOES WNU:81435} desire labs Patient's last menstrual period was 08/24/2024 (approximate). The current method of family planning is {contraceptive method:5051}.  Last pap ***. Results were: {Desc; normal/abnormal w/wildcard:19060} Last mammogram: ***. Results were: {Desc; normal/abnormal w/wildcard:19060}. Family h/o breast cancer: {yes/no:20286} Last colonoscopy: ***. Results were: {Desc; normal/abnormal w/wildcard:19060}. Family h/o colorectal cancer: {yes/no:20286} Review of Systems:   Pertinent items are noted in HPI Denies any headaches, blurred vision, fatigue, shortness of breath, chest pain, abdominal pain, abnormal vaginal discharge/itching/odor/irritation, problems with periods, bowel movements, urination, or intercourse unless otherwise stated above. Pertinent History Reviewed:  Reviewed past medical,surgical, social and family history.  Reviewed problem list, medications and allergies. Physical Assessment:   Vitals:   09/17/24 1051  BP: 120/82  Pulse: 77  Weight: (!) 361 lb 9.6 oz (164 kg)  Height: 5' 10 (1.778 m)  Body mass index is 51.88 kg/m.        Physical Examination:   General appearance - well appearing, and in no distress  Mental status - alert, oriented to person, place, and time  Psych:  She has a normal mood and affect  Skin - warm and dry, normal color, no suspicious lesions noted  Chest - effort normal, all lung fields clear to auscultation bilaterally  Heart - normal rate and regular rhythm  Neck:  midline trachea, no thyromegaly or nodules  Breasts - breasts appear normal, no suspicious masses, no skin or  nipple changes or  axillary nodes  Abdomen - soft, nontender, nondistended, no masses or organomegaly  Pelvic - VULVA: normal appearing vulva with no masses, tenderness or lesions  VAGINA: normal appearing vagina with normal color and discharge, no lesions  CERVIX: normal appearing cervix without discharge or lesions, no CMT  Thin prep pap is {Desc; done/not:10129} *** HR HPV cotesting  UTERUS: uterus is felt to be normal size, shape, consistency and nontender   ADNEXA: No adnexal masses or tenderness noted.  Rectal - normal rectal, good sphincter tone, no masses felt. Hemoccult: ***  Extremities:  No swelling or varicosities noted  Results for orders placed or performed during the hospital encounter of 09/16/24 (from the past 24 hours)  Urinalysis, Routine w reflex microscopic -Urine, Clean Catch   Collection Time: 09/16/24 12:29 PM  Result Value Ref Range   Color, Urine YELLOW YELLOW   APPearance HAZY (A) CLEAR   Specific Gravity, Urine 1.014 1.005 - 1.030   pH 6.5 5.0 - 8.0   Glucose, UA NEGATIVE NEGATIVE mg/dL   Hgb urine dipstick TRACE (A) NEGATIVE   Bilirubin Urine NEGATIVE NEGATIVE   Ketones, ur NEGATIVE NEGATIVE mg/dL   Protein, ur NEGATIVE NEGATIVE mg/dL   Nitrite NEGATIVE NEGATIVE   Leukocytes,Ua LARGE (A) NEGATIVE   RBC / HPF 11-20 0 - 5 RBC/hpf   WBC, UA 11-20 0 - 5 WBC/hpf   Bacteria, UA RARE (A) NONE SEEN   Squamous Epithelial / HPF 6-10 0 - 5 /HPF   Mucus PRESENT    Hyaline Casts, UA PRESENT   Pregnancy, urine   Collection Time: 09/16/24 12:29 PM  Result Value Ref Range   Preg Test, Ur NEGATIVE NEGATIVE  Lipase,  blood   Collection Time: 09/16/24 12:29 PM  Result Value Ref Range   Lipase 22 11 - 51 U/L  Comprehensive metabolic panel   Collection Time: 09/16/24 12:29 PM  Result Value Ref Range   Sodium 139 135 - 145 mmol/L   Potassium 3.5 3.5 - 5.1 mmol/L   Chloride 102 98 - 111 mmol/L   CO2 26 22 - 32 mmol/L   Glucose, Bld 98 70 - 99 mg/dL   BUN 13 6 - 20  mg/dL   Creatinine, Ser 9.20 0.44 - 1.00 mg/dL   Calcium 9.5 8.9 - 89.6 mg/dL   Total Protein 7.1 6.5 - 8.1 g/dL   Albumin 4.1 3.5 - 5.0 g/dL   AST 18 15 - 41 U/L   ALT 19 0 - 44 U/L   Alkaline Phosphatase 48 38 - 126 U/L   Total Bilirubin 1.1 0.0 - 1.2 mg/dL   GFR, Estimated >39 >39 mL/min   Anion gap 11 5 - 15  CBC   Collection Time: 09/16/24 12:29 PM  Result Value Ref Range   WBC 6.5 4.0 - 10.5 K/uL   RBC 4.90 3.87 - 5.11 MIL/uL   Hemoglobin 14.5 12.0 - 15.0 g/dL   HCT 57.9 63.9 - 53.9 %   MCV 85.7 80.0 - 100.0 fL   MCH 29.6 26.0 - 34.0 pg   MCHC 34.5 30.0 - 36.0 g/dL   RDW 87.8 88.4 - 84.4 %   Platelets 255 150 - 400 K/uL   nRBC 0.0 0.0 - 0.2 %    Assessment & Plan:  1) Well-Woman Exam with*** Pap  2) ***  Labs/procedures today: ***  Mammogram *** or sooner if problems Colonoscopy *** or sooner if problems  Orders Placed This Encounter  Procedures   HIV antibody (with reflex)   RPR   Hepatitis C Antibody   Hepatitis B Surface AntiGEN    Meds: No orders of the defined types were placed in this encounter.   Follow-up: No follow-ups on file.  Alexyss Balzarini MSN, CNM 09/17/2024 11:57 AM

## 2024-09-18 ENCOUNTER — Ambulatory Visit: Payer: Self-pay | Admitting: Obstetrics and Gynecology

## 2024-09-18 DIAGNOSIS — A5901 Trichomonal vulvovaginitis: Secondary | ICD-10-CM

## 2024-09-18 DIAGNOSIS — B9689 Other specified bacterial agents as the cause of diseases classified elsewhere: Secondary | ICD-10-CM

## 2024-09-18 LAB — CERVICOVAGINAL ANCILLARY ONLY
Bacterial Vaginitis (gardnerella): POSITIVE — AB
Candida Glabrata: NEGATIVE
Candida Vaginitis: NEGATIVE
Chlamydia: NEGATIVE
Comment: NEGATIVE
Comment: NEGATIVE
Comment: NEGATIVE
Comment: NEGATIVE
Comment: NEGATIVE
Comment: NORMAL
Neisseria Gonorrhea: NEGATIVE
Trichomonas: POSITIVE — AB

## 2024-09-18 LAB — SYPHILIS: RPR W/REFLEX TO RPR TITER AND TREPONEMAL ANTIBODIES, TRADITIONAL SCREENING AND DIAGNOSIS ALGORITHM: RPR Ser Ql: NONREACTIVE

## 2024-09-18 LAB — HEPATITIS B SURFACE ANTIGEN: Hepatitis B Surface Ag: NEGATIVE

## 2024-09-18 LAB — HEPATITIS C ANTIBODY: Hep C Virus Ab: NONREACTIVE

## 2024-09-18 LAB — HIV ANTIBODY (ROUTINE TESTING W REFLEX): HIV Screen 4th Generation wRfx: NONREACTIVE

## 2024-09-18 MED ORDER — METRONIDAZOLE 500 MG PO TABS: 500.0000 mg | ORAL_TABLET | Freq: Two times a day (BID) | ORAL | 0 refills | Status: AC

## 2024-09-22 LAB — CYTOLOGY - PAP
Comment: NEGATIVE
Diagnosis: NEGATIVE
Diagnosis: REACTIVE
High risk HPV: NEGATIVE
